# Patient Record
Sex: Female | Born: 1945 | Race: White | Hispanic: No | Smoking: Never smoker
Health system: Southern US, Community
[De-identification: ages and names within clinical notes are randomized; demographics above are authoritative.]

## PROBLEM LIST (undated history)

## (undated) DIAGNOSIS — R112 Nausea with vomiting, unspecified: Secondary | ICD-10-CM

## (undated) DIAGNOSIS — T7840XA Allergy, unspecified, initial encounter: Secondary | ICD-10-CM

## (undated) DIAGNOSIS — I10 Essential (primary) hypertension: Secondary | ICD-10-CM

## (undated) DIAGNOSIS — F329 Major depressive disorder, single episode, unspecified: Secondary | ICD-10-CM

## (undated) DIAGNOSIS — Z923 Personal history of irradiation: Secondary | ICD-10-CM

## (undated) DIAGNOSIS — J45909 Unspecified asthma, uncomplicated: Secondary | ICD-10-CM

## (undated) DIAGNOSIS — Z9889 Other specified postprocedural states: Secondary | ICD-10-CM

## (undated) DIAGNOSIS — IMO0002 Reserved for concepts with insufficient information to code with codable children: Secondary | ICD-10-CM

## (undated) DIAGNOSIS — F32A Depression, unspecified: Secondary | ICD-10-CM

## (undated) DIAGNOSIS — M1990 Unspecified osteoarthritis, unspecified site: Secondary | ICD-10-CM

## (undated) DIAGNOSIS — I739 Peripheral vascular disease, unspecified: Secondary | ICD-10-CM

## (undated) DIAGNOSIS — R011 Cardiac murmur, unspecified: Secondary | ICD-10-CM

## (undated) DIAGNOSIS — I4891 Unspecified atrial fibrillation: Secondary | ICD-10-CM

## (undated) DIAGNOSIS — IMO0001 Reserved for inherently not codable concepts without codable children: Secondary | ICD-10-CM

## (undated) DIAGNOSIS — E785 Hyperlipidemia, unspecified: Secondary | ICD-10-CM

## (undated) DIAGNOSIS — C50919 Malignant neoplasm of unspecified site of unspecified female breast: Secondary | ICD-10-CM

## (undated) DIAGNOSIS — I639 Cerebral infarction, unspecified: Secondary | ICD-10-CM

## (undated) DIAGNOSIS — D649 Anemia, unspecified: Secondary | ICD-10-CM

## (undated) DIAGNOSIS — E119 Type 2 diabetes mellitus without complications: Secondary | ICD-10-CM

## (undated) DIAGNOSIS — F419 Anxiety disorder, unspecified: Secondary | ICD-10-CM

## (undated) DIAGNOSIS — H269 Unspecified cataract: Secondary | ICD-10-CM

## (undated) HISTORY — DX: Peripheral vascular disease, unspecified: I73.9

## (undated) HISTORY — DX: Cardiac murmur, unspecified: R01.1

## (undated) HISTORY — DX: Cerebral infarction, unspecified: I63.9

## (undated) HISTORY — PX: EYE SURGERY: SHX253

## (undated) HISTORY — DX: Anemia, unspecified: D64.9

## (undated) HISTORY — DX: Unspecified cataract: H26.9

## (undated) HISTORY — DX: Essential (primary) hypertension: I10

## (undated) HISTORY — DX: Unspecified osteoarthritis, unspecified site: M19.90

## (undated) HISTORY — PX: BREAST BIOPSY: SHX20

## (undated) HISTORY — PX: BREAST LUMPECTOMY: SHX2

## (undated) HISTORY — DX: Allergy, unspecified, initial encounter: T78.40XA

## (undated) HISTORY — DX: Type 2 diabetes mellitus without complications: E11.9

## (undated) HISTORY — PX: CHOLECYSTECTOMY: SHX55

## (undated) HISTORY — DX: Hyperlipidemia, unspecified: E78.5

---

## 1985-10-14 HISTORY — PX: APPENDECTOMY: SHX54

## 1985-10-14 HISTORY — PX: EXPLORATORY LAPAROTOMY: SUR591

## 2000-01-10 ENCOUNTER — Other Ambulatory Visit: Admission: RE | Admit: 2000-01-10 | Discharge: 2000-01-10 | Payer: Self-pay | Admitting: Obstetrics and Gynecology

## 2001-03-03 ENCOUNTER — Other Ambulatory Visit: Admission: RE | Admit: 2001-03-03 | Discharge: 2001-03-03 | Payer: Self-pay | Admitting: Obstetrics and Gynecology

## 2001-04-21 ENCOUNTER — Inpatient Hospital Stay (HOSPITAL_COMMUNITY): Admission: EM | Admit: 2001-04-21 | Discharge: 2001-04-24 | Payer: Self-pay | Admitting: Psychiatry

## 2001-04-27 ENCOUNTER — Other Ambulatory Visit (HOSPITAL_COMMUNITY): Admission: RE | Admit: 2001-04-27 | Discharge: 2001-05-01 | Payer: Self-pay | Admitting: Psychiatry

## 2001-06-17 ENCOUNTER — Encounter: Payer: Self-pay | Admitting: Family Medicine

## 2001-06-17 ENCOUNTER — Encounter: Admission: RE | Admit: 2001-06-17 | Discharge: 2001-06-17 | Payer: Self-pay | Admitting: Family Medicine

## 2001-06-19 ENCOUNTER — Inpatient Hospital Stay (HOSPITAL_COMMUNITY): Admission: AD | Admit: 2001-06-19 | Discharge: 2001-06-19 | Payer: Self-pay | Admitting: Obstetrics and Gynecology

## 2009-10-14 HISTORY — PX: SHOULDER SURGERY: SHX246

## 2009-10-16 ENCOUNTER — Encounter: Admission: RE | Admit: 2009-10-16 | Discharge: 2009-10-16 | Payer: Self-pay | Admitting: Unknown Physician Specialty

## 2011-03-01 NOTE — Discharge Summary (Signed)
[facility]  Patient:    Meagan Wallace, Meagan Wallace Visit Number: [id number] MRN: [id number]          Service Type: PSY Location: [facility] Attending Physician:  Benjaman Pott Dictated by:   Reymundo Poll Dub Mikes, M.D. Adm. Date:  04/27/2001 Disc. Date: 05/01/2001                             Discharge Summary  CHIEF COMPLAINT AND PRESENT ILLNESS:  This was the first admission to [facility] for this 65 year old female, who was voluntarily admitted due to delusional behaviors.  Reports increased sadness, depression with vague thoughts of suicide.  No specific plan.  She stated "I just thought I would not be here tomorrow."  The patient was brought into her physicians office by her husband yesterday after coming home and looking for a niece, who lives in [place].  Reports she has been hearing gunshots, a 21-gun salute, from the noise of peoples shoes at work.  She also stated that she believes people at work are against, that they are talking about her.  When asked about urges to hurt other people, patient stated that she just wants to punch a man. She is referring to a man at church, "I just want to punch this man in the nose because the negative comments he makes about his grandsons music."  The patient reports inability to commence to sleep for the last 1-2 weeks because, when she tries to rest, she states "my mind is on fire."  The patient also states that, at the doctors office, she saw a lot of paperbacks that were also on fire.  The patient is unable to cite any particular stressor at this time.  She is able to promise safety.  She has no specific plan or intent to hurt herself.  She also cites some additional stressor of losing her church community.  She would not attend this church anymore because her husband does not want to go there.  She feels that she should be with him and support him.  PAST PSYCHIATRIC HISTORY:  Denies any previous  inpatient treatment.  Some counseling through [facility] here in [place].  Does have a current psychiatrist.  Was prescribed antidepressant by his family physician.  Does describe a previous history of depression with poor sleep several years ago.  ALCOHOL/DRUG HISTORY:  Denies the use or abuse of any drugs or alcohol.  PAST MEDICAL HISTORY:  Hypertension, asthma, mild anemia, mitral valve prolapse tricuspid regurgitation, history of rheumatic fever, history of colon polyps.  MEDICATIONS:  Prinzide 25 mg daily, Prempro 0.625 mg, 5 every day, atenolol 50 mg every day, Maxair 2 puffs as needed and Zoloft 50 mg per day.  She reports that she has stopped all of these medications and has taken none over the past 1-2 weeks.  PHYSICAL EXAMINATION:  Performed at the emergency room and was within normal limits.  MENTAL STATUS EXAMINATION:  Casually dressed, overweight, middle-aged female, who hygiene is good.  Dressed appropriately.  Her affect is blunted.  Poor eye contact, actually keeping her eyes closed through most of the interview. Otherwise, cooperative and pleasant.  Guarded most of the time.  Speech is spontaneous, normal is pace and tone, generally relevant.  Mood is guarded, sad, tearful and depressed.  Thought process is positive for paranoid ideation.  Positive for both suicidal ideation and some homicidal ideation. No specific intent or plan.  Having  both auditory and visual hallucinations. Oriented to person, place and time.  ADMITTING DIAGNOSES: Axis I:    Major depression, recurrent, severe with psychotic features. Axis II:   No diagnosis. Axis III:  1. Mild anemia.            2. Hypertension. Axis IV:   Moderate. Axis V:    Global Assessment of Functioning upon admission 30; highest Global            Assessment of Functioning in the last year 60.  LABORATORY DATA:  Within normal limits except for hemoglobin was 11.1.  HOSPITAL COURSE:  She was admitted and  started intensive individual and group psychotherapy.  She was kept on the Prinzide, Prempro, atenolol and Maxair. She was started on Zoloft 50 mg per day.  It was increased to 75 mg per day. She was started on Risperdal 0.25 mg twice a day.  She started responding to the medications.  She was initially very vague.  Reality testing was affected. Some ideas of reference.  Recognizing people on the unit that she did not know from before.  Cannot accept the fact that this might be out of touch with reality.  On July 12, she felt she was much better, sleeping better, denies any further confusion, denies suicidal ideation.  Met with her and her husband.  She feels he is back to baseline with her.  Does understand she needs to stay on the medication.  He feels comfortable taking her home.  She is willing to come back to IOP for further stabilization.  There is no suicidal ideation, no homicidal ideation, no active psychosis, for what discharge was granted.  DISCHARGE DIAGNOSES: Axis I:    Major depression, recurrent, severe with psychotic features. Axis II:   No diagnosis. Axis III:  1. Hypertension.            2. Mild anemia. Axis IV:   Moderate. Axis V:    Global Assessment of Functioning upon discharge 50-55.  DISCHARGE MEDICATIONS: 1. Risperdal 0.25 mg twice a day. 2. Zoloft 100 mg at bedtime. 3. Premarin. 4. Provera. 5. Tenormin. 6. Prinzide.  FOLLOW-UP:  IOP program. Dictated by:   Reymundo Poll. Dub Mikes, M.D. Attending Physician:  Carolanne Grumbling D DD:  06/03/01 TD:  06/08/01 Job: [id number]

## 2011-03-01 NOTE — H&P (Signed)
[facility]  Patient:    Meagan Wallace, Meagan Wallace                    MRN: [id number] Adm. Date:  [id number] Attending:  Rachael Fee Dictator:   Young Berry Lorin Picket, N.P.                   Psychiatric Admission Assessment  DATE OF ADMISSION:  April 21, 2001.  IDENTIFYING INFORMATION:  This is a 65 year old married Caucasian female who is a voluntary admission for delusional behavior.  HISTORY OF THE PRESENT ILLNESS:  Patient reports increased sadness and depression with vague thoughts of suicide but no specific plan.  She states "I just thought I wouldnt be here tomorrow."  Patient was brought in to her physicians office by her husband yesterday after coming home and looking for a niece who lives in [place].  Patient also reports she has been hearing gunshots, a 21-gun salute, from the noise of peoples shoes at work, and she also states that she believes people at work are against her, that they are talking about her.  When asked about urges to hurt other people, patient states that she just wants to punch a man -- she is referring to a man at church -- "I just want to punch this man in the nose because of negative comments he made about her grandsons music.  Patient reports inability to sleep for the last 1-2 weeks, because when she tries to rest she states "my mind is on fire."  Patient also states that at the doctors office she saw a lot of paper bags that were also on fire.  Patient is unable to cite any specific stressors at this time.  She is able to promise safety on the unit. She has no specific plan or intent at this time to commit suicide.  She does cite some additional stressors of losing her church community.  She will not attend at this church anymore, because her husband does not want to go there and she feels that she should be with him and support him.  PAST PSYCHIATRIC HISTORY:  Patient denies any previous inpatient treatment. She has had  some counseling recently through [facility] here in [place].  She does not have a current psychiatrist.  She has been prescribed antidepressant by her primary care physician.  Patient does describe a previous history of depression with poor sleep several years ago at the time she was changing jobs.  SOCIAL HISTORY:  Patient is currently married.  She lives with her husband in what she states is a supportive marriage, but then later states "some days are better than others."  She works full time at [employer] in [place].  She has no biological children.  She has 2 stepchildren and 5 grandchildren, and she feels sad about not being able to see her grandchildren as often as she would like.  FAMILY HISTORY:  Patient denies any family history of substance abuse or mental illness.  ALCOHOL AND DRUG HISTORY:  Patient denies any use of alcohol or illegal drugs or tobacco.  PAST MEDICAL HISTORY:  Patient is followed by Dr. Nathanial Rancher at [facility].  Medical problems include hypertension, asthma, mild anemia, mitral valve prolapse with tricuspid regurgitation as noted on echo previously, history of rheumatic fever as a child, and a history of colon polyps.  This medical history was provided by fax from Dr. Kathlen Mody office.  Patient has had a D&C in the  past for a polypectomy and appendectomy.  Medications are Prinzide 20/25 1 q.d., Prempro .625/5 1 q.d., atenolol 50 mg q.d., Maxair 2 puffs p.r.n. for shortness of breath, and Zoloft 50 mg q.d.  It should be noted here that the patient reports that she has stopped all of these medications and has taken none at various times over the past 1-2 weeks.  DRUG ALLERGIES:  No known drug allergies.  POSITIVE PHYSICAL FINDINGS:  Patients PE is pending.  Her CMET is within normal limits and additional labs are pending, including a urine and a urine drug screen.  MENTAL STATUS EXAMINATION:  This is a casually dressed, overweight  middle-aged female whose hygiene is good.  She is dressed appropriately.  Her affect is blunted.  She has poor eye contact, actually keeping her eyes closed through most of the interview, but she is otherwise cooperative and pleasant, though guarded most of the time.  Speech is spontaneous and is normal in pace and tone and is generally relevant.  Mood is guarded, sad, tearful and depressed. Thought process is positive for paranoid ideation, positive for both suicidal ideation and some homicidal ideation, with no specific intent or plan.  She is having both auditory hallucinations and visual hallucinations.  Cognitively, she is oriented x 3 and is intact.  ADMISSION DIAGNOSES: Axis I:    Major depression, recurrent, severe, with psychotic features. Axis II:   Deferred. Axis III:  Mild anemia and hypertension. Axis IV:   Moderate problems related to the primary support group, including            some underlying stress in her marriage that appears evident. Axis V:    Current 30, past year 72.  INITIAL PLAN OF CARE:  We will admit the patient to stabilize her mood.  Q.15 minute checks are in place for safety.  She is able to promise safety on the unit.  We will ask the case manager to do a family session with the patient and her husband to discuss concerns when the patient is able to tolerate a session.  We have started her on Risperdal 0.125 mg p.o. b.i.d. and we will restart her routine medications, including her Zoloft 50 mg daily.  We will also start Seroquel 25 mg p.o. q.h.s. and our goal is to alleviate her suicidal ideation and her homicidal ideation and to eliminate her psychotic symptoms so that she can go home and be safe, functioning in her activities of daily living.  TENTATIVE LENGTH OF STAY:  4-6 days. DD:  04/22/01 TD:  04/22/01 Job: [id number]

## 2011-11-26 DIAGNOSIS — E119 Type 2 diabetes mellitus without complications: Secondary | ICD-10-CM | POA: Diagnosis not present

## 2011-11-26 DIAGNOSIS — I10 Essential (primary) hypertension: Secondary | ICD-10-CM | POA: Diagnosis not present

## 2011-11-26 DIAGNOSIS — J45909 Unspecified asthma, uncomplicated: Secondary | ICD-10-CM | POA: Diagnosis not present

## 2012-05-14 DIAGNOSIS — H35039 Hypertensive retinopathy, unspecified eye: Secondary | ICD-10-CM | POA: Diagnosis not present

## 2012-05-14 DIAGNOSIS — H40059 Ocular hypertension, unspecified eye: Secondary | ICD-10-CM | POA: Diagnosis not present

## 2012-05-26 DIAGNOSIS — R7301 Impaired fasting glucose: Secondary | ICD-10-CM | POA: Diagnosis not present

## 2012-05-26 DIAGNOSIS — I10 Essential (primary) hypertension: Secondary | ICD-10-CM | POA: Diagnosis not present

## 2012-08-01 DIAGNOSIS — Z23 Encounter for immunization: Secondary | ICD-10-CM | POA: Diagnosis not present

## 2012-09-03 DIAGNOSIS — I2510 Atherosclerotic heart disease of native coronary artery without angina pectoris: Secondary | ICD-10-CM | POA: Diagnosis not present

## 2012-09-03 DIAGNOSIS — I10 Essential (primary) hypertension: Secondary | ICD-10-CM | POA: Diagnosis not present

## 2012-09-03 DIAGNOSIS — E7800 Pure hypercholesterolemia, unspecified: Secondary | ICD-10-CM | POA: Diagnosis not present

## 2012-09-03 DIAGNOSIS — E119 Type 2 diabetes mellitus without complications: Secondary | ICD-10-CM | POA: Diagnosis not present

## 2012-10-13 DIAGNOSIS — I2510 Atherosclerotic heart disease of native coronary artery without angina pectoris: Secondary | ICD-10-CM | POA: Diagnosis not present

## 2012-10-13 DIAGNOSIS — E7800 Pure hypercholesterolemia, unspecified: Secondary | ICD-10-CM | POA: Diagnosis not present

## 2012-10-13 DIAGNOSIS — E119 Type 2 diabetes mellitus without complications: Secondary | ICD-10-CM | POA: Diagnosis not present

## 2012-10-13 DIAGNOSIS — J45901 Unspecified asthma with (acute) exacerbation: Secondary | ICD-10-CM | POA: Diagnosis not present

## 2012-10-13 DIAGNOSIS — J209 Acute bronchitis, unspecified: Secondary | ICD-10-CM | POA: Diagnosis not present

## 2012-10-13 DIAGNOSIS — I10 Essential (primary) hypertension: Secondary | ICD-10-CM | POA: Diagnosis not present

## 2012-10-23 DIAGNOSIS — E119 Type 2 diabetes mellitus without complications: Secondary | ICD-10-CM | POA: Diagnosis not present

## 2012-10-23 DIAGNOSIS — J45909 Unspecified asthma, uncomplicated: Secondary | ICD-10-CM | POA: Diagnosis not present

## 2012-10-23 DIAGNOSIS — I10 Essential (primary) hypertension: Secondary | ICD-10-CM | POA: Diagnosis not present

## 2012-11-06 DIAGNOSIS — I10 Essential (primary) hypertension: Secondary | ICD-10-CM | POA: Diagnosis not present

## 2012-11-06 DIAGNOSIS — J45909 Unspecified asthma, uncomplicated: Secondary | ICD-10-CM | POA: Diagnosis not present

## 2012-11-13 DIAGNOSIS — R062 Wheezing: Secondary | ICD-10-CM | POA: Diagnosis not present

## 2012-11-20 DIAGNOSIS — J45909 Unspecified asthma, uncomplicated: Secondary | ICD-10-CM | POA: Diagnosis not present

## 2012-11-20 DIAGNOSIS — I10 Essential (primary) hypertension: Secondary | ICD-10-CM | POA: Diagnosis not present

## 2013-02-19 DIAGNOSIS — I10 Essential (primary) hypertension: Secondary | ICD-10-CM | POA: Diagnosis not present

## 2013-02-19 DIAGNOSIS — E785 Hyperlipidemia, unspecified: Secondary | ICD-10-CM | POA: Diagnosis not present

## 2013-02-19 DIAGNOSIS — E119 Type 2 diabetes mellitus without complications: Secondary | ICD-10-CM | POA: Diagnosis not present

## 2013-02-19 DIAGNOSIS — F329 Major depressive disorder, single episode, unspecified: Secondary | ICD-10-CM | POA: Diagnosis not present

## 2013-03-09 DIAGNOSIS — F4321 Adjustment disorder with depressed mood: Secondary | ICD-10-CM | POA: Diagnosis not present

## 2013-03-23 DIAGNOSIS — F4321 Adjustment disorder with depressed mood: Secondary | ICD-10-CM | POA: Diagnosis not present

## 2013-04-02 DIAGNOSIS — F4321 Adjustment disorder with depressed mood: Secondary | ICD-10-CM | POA: Diagnosis not present

## 2013-04-14 DIAGNOSIS — F4321 Adjustment disorder with depressed mood: Secondary | ICD-10-CM | POA: Diagnosis not present

## 2013-04-28 DIAGNOSIS — F4321 Adjustment disorder with depressed mood: Secondary | ICD-10-CM | POA: Diagnosis not present

## 2013-05-12 DIAGNOSIS — F4321 Adjustment disorder with depressed mood: Secondary | ICD-10-CM | POA: Diagnosis not present

## 2013-06-08 DIAGNOSIS — F4321 Adjustment disorder with depressed mood: Secondary | ICD-10-CM | POA: Diagnosis not present

## 2013-07-01 DIAGNOSIS — F4321 Adjustment disorder with depressed mood: Secondary | ICD-10-CM | POA: Diagnosis not present

## 2013-08-04 DIAGNOSIS — Z23 Encounter for immunization: Secondary | ICD-10-CM | POA: Diagnosis not present

## 2013-08-17 DIAGNOSIS — J45909 Unspecified asthma, uncomplicated: Secondary | ICD-10-CM | POA: Diagnosis not present

## 2013-08-17 DIAGNOSIS — E119 Type 2 diabetes mellitus without complications: Secondary | ICD-10-CM | POA: Diagnosis not present

## 2013-08-17 DIAGNOSIS — I10 Essential (primary) hypertension: Secondary | ICD-10-CM | POA: Diagnosis not present

## 2013-08-17 DIAGNOSIS — F329 Major depressive disorder, single episode, unspecified: Secondary | ICD-10-CM | POA: Diagnosis not present

## 2013-09-02 DIAGNOSIS — Z0000 Encounter for general adult medical examination without abnormal findings: Secondary | ICD-10-CM | POA: Diagnosis not present

## 2013-09-15 DIAGNOSIS — Z1211 Encounter for screening for malignant neoplasm of colon: Secondary | ICD-10-CM | POA: Diagnosis not present

## 2013-09-27 DIAGNOSIS — J309 Allergic rhinitis, unspecified: Secondary | ICD-10-CM | POA: Diagnosis not present

## 2013-09-27 DIAGNOSIS — H6690 Otitis media, unspecified, unspecified ear: Secondary | ICD-10-CM | POA: Diagnosis not present

## 2013-09-29 DIAGNOSIS — Z13820 Encounter for screening for osteoporosis: Secondary | ICD-10-CM | POA: Diagnosis not present

## 2013-09-29 DIAGNOSIS — Z1231 Encounter for screening mammogram for malignant neoplasm of breast: Secondary | ICD-10-CM | POA: Diagnosis not present

## 2013-09-29 DIAGNOSIS — R928 Other abnormal and inconclusive findings on diagnostic imaging of breast: Secondary | ICD-10-CM | POA: Diagnosis not present

## 2013-11-01 DIAGNOSIS — R928 Other abnormal and inconclusive findings on diagnostic imaging of breast: Secondary | ICD-10-CM | POA: Diagnosis not present

## 2013-11-01 DIAGNOSIS — N63 Unspecified lump in unspecified breast: Secondary | ICD-10-CM | POA: Diagnosis not present

## 2013-11-11 DIAGNOSIS — N63 Unspecified lump in unspecified breast: Secondary | ICD-10-CM | POA: Diagnosis not present

## 2013-11-11 DIAGNOSIS — D0590 Unspecified type of carcinoma in situ of unspecified breast: Secondary | ICD-10-CM | POA: Diagnosis not present

## 2013-11-12 DIAGNOSIS — D0590 Unspecified type of carcinoma in situ of unspecified breast: Secondary | ICD-10-CM | POA: Diagnosis not present

## 2013-11-23 ENCOUNTER — Telehealth: Payer: Self-pay | Admitting: *Deleted

## 2013-11-23 DIAGNOSIS — C50211 Malignant neoplasm of upper-inner quadrant of right female breast: Secondary | ICD-10-CM

## 2013-11-23 DIAGNOSIS — D0511 Intraductal carcinoma in situ of right breast: Secondary | ICD-10-CM | POA: Insufficient documentation

## 2013-11-23 NOTE — Telephone Encounter (Signed)
Confirmed [facility] for 2/18/15at 0800 .  Instructions and contact information given.

## 2013-12-01 ENCOUNTER — Encounter (INDEPENDENT_AMBULATORY_CARE_PROVIDER_SITE_OTHER): Payer: Self-pay | Admitting: Surgery

## 2013-12-01 ENCOUNTER — Ambulatory Visit
Admission: RE | Admit: 2013-12-01 | Discharge: 2013-12-01 | Disposition: A | Discharge status: Home / Self Care | Payer: Medicare Other | Source: Ambulatory Visit | Attending: Radiation Oncology | Admitting: Radiation Oncology

## 2013-12-01 ENCOUNTER — Ambulatory Visit: Payer: Medicare Other | Admitting: Physical Therapy

## 2013-12-01 ENCOUNTER — Encounter: Payer: Self-pay | Admitting: Oncology

## 2013-12-01 ENCOUNTER — Telehealth: Payer: Self-pay | Admitting: Oncology

## 2013-12-01 ENCOUNTER — Ambulatory Visit (HOSPITAL_BASED_OUTPATIENT_CLINIC_OR_DEPARTMENT_OTHER): Payer: Medicare Other | Admitting: Surgery

## 2013-12-01 ENCOUNTER — Other Ambulatory Visit (HOSPITAL_BASED_OUTPATIENT_CLINIC_OR_DEPARTMENT_OTHER): Payer: Medicare Other

## 2013-12-01 ENCOUNTER — Encounter: Payer: Self-pay | Admitting: *Deleted

## 2013-12-01 ENCOUNTER — Other Ambulatory Visit: Payer: Self-pay | Admitting: *Deleted

## 2013-12-01 ENCOUNTER — Ambulatory Visit (HOSPITAL_BASED_OUTPATIENT_CLINIC_OR_DEPARTMENT_OTHER): Payer: Medicare Other | Admitting: Oncology

## 2013-12-01 ENCOUNTER — Ambulatory Visit: Payer: Medicare Other

## 2013-12-01 VITALS — BP 158/95 | HR 96 | Temp 98.4°F | Resp 20 | Ht 64.0 in | Wt 257.6 lb

## 2013-12-01 DIAGNOSIS — Z171 Estrogen receptor negative status [ER-]: Secondary | ICD-10-CM | POA: Diagnosis not present

## 2013-12-01 DIAGNOSIS — D0590 Unspecified type of carcinoma in situ of unspecified breast: Secondary | ICD-10-CM

## 2013-12-01 DIAGNOSIS — C50211 Malignant neoplasm of upper-inner quadrant of right female breast: Secondary | ICD-10-CM

## 2013-12-01 DIAGNOSIS — D0510 Intraductal carcinoma in situ of unspecified breast: Secondary | ICD-10-CM | POA: Insufficient documentation

## 2013-12-01 DIAGNOSIS — C50219 Malignant neoplasm of upper-inner quadrant of unspecified female breast: Secondary | ICD-10-CM | POA: Diagnosis not present

## 2013-12-01 DIAGNOSIS — C50919 Malignant neoplasm of unspecified site of unspecified female breast: Secondary | ICD-10-CM

## 2013-12-01 LAB — COMPREHENSIVE METABOLIC PANEL (CC13)
ALT: 17 U/L (ref 0–55)
AST: 13 U/L (ref 5–34)
Albumin: 3.7 g/dL (ref 3.5–5.0)
Alkaline Phosphatase: 97 U/L (ref 40–150)
Anion Gap: 11 mEq/L (ref 3–11)
BUN: 11.8 mg/dL (ref 7.0–26.0)
CO2: 27 mEq/L (ref 22–29)
Calcium: 10.5 mg/dL — ABNORMAL HIGH (ref 8.4–10.4)
Chloride: 103 mEq/L (ref 98–109)
Creatinine: 0.7 mg/dL (ref 0.6–1.1)
Glucose: 103 mg/dl (ref 70–140)
Potassium: 4.2 mEq/L (ref 3.5–5.1)
Sodium: 141 mEq/L (ref 136–145)
Total Bilirubin: 0.56 mg/dL (ref 0.20–1.20)
Total Protein: 7.4 g/dL (ref 6.4–8.3)

## 2013-12-01 LAB — CBC WITH DIFFERENTIAL/PLATELET
BASO%: 1 % (ref 0.0–2.0)
Basophils Absolute: 0.1 10*3/uL (ref 0.0–0.1)
EOS%: 0.5 % (ref 0.0–7.0)
Eosinophils Absolute: 0.1 10*3/uL (ref 0.0–0.5)
HCT: 42.1 % (ref 34.8–46.6)
HGB: 13.8 g/dL (ref 11.6–15.9)
LYMPH%: 15.5 % (ref 14.0–49.7)
MCH: 26.4 pg (ref 25.1–34.0)
MCHC: 32.7 g/dL (ref 31.5–36.0)
MCV: 80.7 fL (ref 79.5–101.0)
MONO#: 0.9 10*3/uL (ref 0.1–0.9)
MONO%: 7.2 % (ref 0.0–14.0)
NEUT#: 9.2 10*3/uL — ABNORMAL HIGH (ref 1.5–6.5)
NEUT%: 75.8 % (ref 38.4–76.8)
Platelets: 236 10*3/uL (ref 145–400)
RBC: 5.21 10*6/uL (ref 3.70–5.45)
RDW: 14.9 % — ABNORMAL HIGH (ref 11.2–14.5)
WBC: 12.1 10*3/uL — ABNORMAL HIGH (ref 3.9–10.3)
lymph#: 1.9 10*3/uL (ref 0.9–3.3)

## 2013-12-01 NOTE — Progress Notes (Unsigned)
Checked in new patient with no financial issues. She has [facility] packet and appt card. She wants messages left on vmail only.  *************  Do not leave with husband *************  He has demetia.

## 2013-12-01 NOTE — Telephone Encounter (Signed)
, °

## 2013-12-01 NOTE — Progress Notes (Signed)
Radiation Oncology         [phone]) [phone] ________________________________  Initial outpatient Consultation - Date: 12/01/2013   Name: Meagan Wallace MRN: [id number]   DOB: 04-21-1946  REFERRING PHYSICIAN: Turner Daniels., MD  DIAGNOSIS: No diagnosis found.  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   HISTORY OF PRESENT ILLNESS::Meagan Wallace is a 68 y.o. female  presented for screening mammogram and was found have a mass and calcifications in the right breast. The mass measured 1.4 cm. The calcifications are not biopsied. 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 No invasive component was noted. ER/PR negative. An MRI has not been ordered. She has no symptoms and no previous history of breast cancer. She is GX P0. She has had pain in her right breast after her biopsy. She does have a history of a right shoulder surgery. She was concerned about whether she would have any loss of motion in her right arm.  PREVIOUS RADIATION THERAPY: No  PAST MEDICAL HISTORY:  has a past medical history of Diabetes mellitus without complication; Hypertension; Cataract; and Stroke.    PAST SURGICAL HISTORY: Past Surgical History  Procedure Laterality Date  . Shoulder surgery      FAMILY HISTORY:  Family History  Problem Relation Age of Onset  . Breast cancer Paternal Aunt     SOCIAL HISTORY:  History  Substance Use Topics  . Smoking status: Never Smoker   . Smokeless tobacco: Not on file  . Alcohol Use: No    ALLERGIES: Review of patient's allergies indicates no known allergies.  MEDICATIONS:  Current Outpatient Prescriptions  Medication Sig Dispense Refill  . amLODipine (NORVASC) 10 MG tablet Take 10 mg by mouth daily.        Marland Kitchen aspirin 325 MG tablet Take 325 mg by mouth daily.      . hydrochlorothiazide (HYDRODIURIL) 25 MG tablet Take 25 mg by mouth daily.      . metFORMIN (GLUCOPHAGE) 500 MG tablet Take 500 mg by mouth 2 (two) times daily with a meal.      . ramipril (ALTACE) 10 MG capsule Take 10 mg by mouth 2 (two) times daily.      . sertraline (ZOLOFT) 100 MG tablet Take 100 mg by mouth daily.       No current facility-administered medications for this encounter.    REVIEW OF SYSTEMS:  A 15 point review of systems is documented in the electronic medical record. This was obtained by the nursing staff. However, I reviewed this with the patient to discuss relevant findings and make appropriate changes.  Pertinent items are noted in HPI.  PHYSICAL EXAM: There were no vitals filed for this visit.. . Pleasant female in no distress sitting comfortably on examining room table. She has large pendulous breasts bilaterally. No palpable abnormalities of the right breast. She has no biopsy change on the skin. No palpable abnormalities of the left breast. She has 5 out of 5 strength bilaterally. No palpable axillary supraclavicular or cervical adenopathy. She is alert minus x3.  LABORATORY DATA:  Lab Results  Component Value Date   WBC 12.1* 12/01/2013   HGB 13.8 12/01/2013   HCT 42.1 12/01/2013   MCV 80.7 12/01/2013   PLT  236 12/01/2013   Lab Results  Component Value Date   NA 141 12/01/2013   K 4.2 12/01/2013   CO2 27 12/01/2013   Lab Results  Component Value Date   ALT 17 12/01/2013   AST 13 12/01/2013   ALKPHOS 97 12/01/2013   BILITOT 0.56 12/01/2013     RADIOGRAPHY: No results found.    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 We discussed the equivalency in terms of survival between mastectomy and lumpectomy. There is no survival benefit to mastectomy. She has early stage 0  disease and large breasts. I do believe she would benefit from a biopsy of these calcifications for surgical planning. Unit she did have to have this area removed she could likely tolerate this with the minimal cosmetic defect. She has some asymmetry which could be corrected a later date. She has no invasive disease I do not believe she would benefit from a sentinel lymph node. We discussed the role of radiation and decreasing local failures in patients who undergo lumpectomy. We discussed the process of simulation the placement tattoos. We discussed 6 weeks of treatment as an outpatient. We discussed treatment here or -- thorough. They're midway between the hospital summer for further receive her treatment here. We discussed skin redness and fatigue as possible side effects. We discussed the low likelihood of symptomatic lung or rib damage.  She will meet with physical therapy, medical oncology, surgery and a member of our patient family support team. The navigator's will followup with her as well regarding scheduling her MRI and her biopsy. I spent 40 minutes  face to face with the patient and more than 50% of that time was spent in counseling and/or coordination of care.   ------------------------------------------------  Thea Silversmith, MD

## 2013-12-01 NOTE — Progress Notes (Signed)
[place] Psychosocial Distress Screening Clinical Social Work  Patient completed distress screening protocol, and scored a 1 on the Psychosocial Distress Thermometer which indicates mild distress. Clinical Education officer, museum met with pt and pt's family in [facility] to assess for distress and other psychosocial needs.  Pt stated she was doing well and felt comfortable with her treatment plan.  CSW informed pt of the support team and support services at [facility].  Pt was appreciative of support, but did not express any needs at this time.  CSE encouraged pt to call with any additional questions or concerns.     Johnnye Lana, MSW, [place] Worker [facility] [phone]

## 2013-12-01 NOTE — Progress Notes (Signed)
Patient ID: Meagan Wallace, female   DOB: [birth year], 69 y.o.   MRN: [id number]  No chief complaint on file.   HPI Meagan Wallace is a 67 y.o. female.  Pt presents with DCIS to [facility] clinic today. No other complaints today. No history of breast issues.  No masses.  Has an area of calcifications not yet bx.  HPI  Past Medical History  Diagnosis Date  . Diabetes mellitus without complication   . Hypertension   . Cataract   . Stroke     Past Surgical History  Procedure Laterality Date  . Shoulder surgery      Family History  Problem Relation Age of Onset  . Breast cancer Paternal Aunt     Social History History  Substance Use Topics  . Smoking status: Never Smoker   . Smokeless tobacco: Not on file  . Alcohol Use: No    No Known Allergies  Current Outpatient Prescriptions  Medication Sig Dispense Refill  . amLODipine (NORVASC) 10 MG tablet Take 10 mg by mouth daily.      Marland Kitchen aspirin 325 MG tablet Take 325 mg by mouth daily.      . hydrochlorothiazide (HYDRODIURIL) 25 MG tablet Take 25 mg by mouth daily.      . metFORMIN (GLUCOPHAGE) 500 MG tablet Take 500 mg by mouth 2 (two) times daily with a meal.      . ramipril (ALTACE) 10 MG capsule Take 10 mg by mouth 2 (two) times daily.      . sertraline (ZOLOFT) 100 MG tablet Take 100 mg by mouth daily.       No current facility-administered medications for this visit.    Review of Systems Review of Systems  Constitutional: Negative for fever, chills and unexpected weight change.  HENT: Negative for congestion, hearing loss, sore throat, trouble swallowing and voice change.   Eyes: Negative for visual disturbance.  Respiratory: Negative for cough and wheezing.   Cardiovascular: Negative for chest pain, palpitations and leg swelling.  Gastrointestinal: Negative for nausea, vomiting, abdominal pain, diarrhea, constipation, blood in stool, abdominal distention and anal bleeding.  Genitourinary: Negative for hematuria,  vaginal bleeding and difficulty urinating.  Musculoskeletal: Negative for arthralgias.  Skin: Negative for rash and wound.  Neurological: Negative for seizures, syncope and headaches.  Hematological: Negative for adenopathy. Does not bruise/bleed easily.  Psychiatric/Behavioral: Negative for confusion.    There were no vitals taken for this visit.  Physical Exam Physical Exam  Constitutional: She is oriented to person, place, and time. She appears well-developed.  HENT:  Head: Normocephalic.  Mouth/Throat: No oropharyngeal exudate.  Eyes: EOM are normal. Pupils are equal, round, and reactive to light.  Neck: Normal range of motion. Neck supple.  Cardiovascular: Normal rate and regular rhythm.   Pulmonary/Chest: Effort normal and breath sounds normal. Right breast exhibits no inverted nipple, no nipple discharge, no skin change and no tenderness. Left breast exhibits no inverted nipple, no mass, no nipple discharge, no skin change and no tenderness. Breasts are symmetrical.  Musculoskeletal: Normal range of motion.  Neurological: She is alert and oriented to person, place, and time.  Skin: Skin is warm and dry.  Psychiatric: She has a normal mood and affect. Her behavior is normal. Judgment and thought content normal.    Data Reviewed 1 CM AREA OF DISTORTION AND SECOND AREA OF CALCIFICATIONS NOT BX YET. PATH DCIS 1 CM RIGHT  Assessment    RIGHT DCIS AND SECOND UNKNOWN AREA    Plan  NEEDS MRI AND BIOPSY.  SEE ME NEXT WEEK.        Samantha Ragen A. 12/01/2013, 10:38 AM

## 2013-12-01 NOTE — Progress Notes (Signed)
Meagan Wallace [id number] [birth year] 68 y.o. 12/01/2013 10:35 AM  CC Dr. Erroll Luna Dr. 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  STAGE:   Breast cancer of upper-inner quadrant of right female breast   Primary site: Breast (Right)   Staging method: AJCC 7th Edition   Clinical: Stage 0 (Tis, N0, cM0)   Summary: Stage 0 (Tis, N0, cM0)  REFERRING PHYSICIAN: Dr. Marcello Moores Cornett  HISTORY OF PRESENT ILLNESS:  Meagan Wallace is a 68 y.o. female.  Who underwent a screening mammogram and was found to have a mass in calcifications in the right breast. The mass measured 1.4 cm.  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 There was no invasive disease found. The calcifications have not been biopsied. Her tumor  ER negative  PR negative. Her case was discussed at the multidisciplinary breast conference.her pathology and radiology were reviewed. She now presents at the multidisciplinary breast clinic for discussion of treatment options.   Past Medical History: Past Medical History  Diagnosis Date  . Diabetes mellitus without complication   . Hypertension   . Cataract   . Stroke     Past Surgical History: Past Surgical History  Procedure Laterality Date  . Shoulder surgery      Family History: Family History  Problem Relation Age of Onset  . Breast cancer Paternal Aunt     Social History History  Substance Use Topics  . Smoking status: Never Smoker   . Smokeless tobacco: Not on file  . Alcohol Use: No    Allergies: No Known Allergies  Current Medications: Current Outpatient Prescriptions  Medication Sig Dispense Refill  . amLODipine (NORVASC) 10 MG tablet Take 10 mg by mouth daily.      Marland Kitchen aspirin 325 MG tablet Take 325 mg by mouth daily.      . hydrochlorothiazide (HYDRODIURIL) 25 MG tablet Take 25 mg by mouth daily.      . metFORMIN  (GLUCOPHAGE) 500 MG tablet Take 500 mg by mouth 2 (two) times daily with a meal.      . ramipril (ALTACE) 10 MG capsule Take 10 mg by mouth 2 (two) times daily.      . sertraline (ZOLOFT) 100 MG tablet Take 100 mg by mouth daily.       No current facility-administered medications for this visit.    OB/GYN History: menarche at age 56 1/2, menopause in 87, no HRT, OCP x 10 years, nulliparous.  Fertility Discussion: n/a Prior History of Cancer: no   Health Maintenance:  Colonoscopy yes Bone Density yes Last PAP smear unknown  ECOG PERFORMANCE STATUS: 1 - Symptomatic but completely ambulatory  Genetic Counseling/testing: no  REVIEW OF SYSTEMS:  A comprehensive review of systems was negative.  PHYSICAL EXAMINATION: Blood pressure 158/95, pulse 96, temperature 98.4 F (36.9 C), temperature source Oral, resp. rate 20, height 5\' 4"  (1.626 m), weight 257 lb 9.6 oz (116.847 kg).  General:  well-nourished in no acute distress.  Eyes:  no scleral icterus.  ENT:  There were no oropharyngeal lesions.  Neck was without thyromegaly.  Lymphatics:  Negative cervical, supraclavicular or axillary adenopathy.  Respiratory: lungs were clear bilaterally without wheezing or crackles.  Cardiovascular:  Regular rate and rhythm, S1/S2, without murmur, rub or gallop.  There was no pedal edema.  GI:  abdomen was soft, flat, nontender, nondistended, without organomegaly.  Muscoloskeletal:  no spinal tenderness  of palpation of vertebral spine.  Skin exam was without echymosis, petichae.  Neuro exam was nonfocal.  Patient was able to get on and off exam table without assistance.  Gait was normal.  Patient was alerted and oriented.  Attention was good.   Language was appropriate.  Mood was normal without depression.  Speech was not pressured.  Thought content was not tangential.   Breasts: breasts appear normal, no suspicious masses, no skin or nipple changes or axillary nodes.   STUDIES/RESULTS: No results  found.   LABS:    Chemistry      Component Value Date/Time   NA 141 12/01/2013 0807   K 4.2 12/01/2013 0807   CO2 27 12/01/2013 0807   BUN 11.8 12/01/2013 0807   CREATININE 0.7 12/01/2013 0807      Component Value Date/Time   CALCIUM 10.5* 12/01/2013 0807   ALKPHOS 97 12/01/2013 0807   AST 13 12/01/2013 0807   ALT 17 12/01/2013 0807   BILITOT 0.56 12/01/2013 0807      Lab Results  Component Value Date   WBC 12.1* 12/01/2013   HGB 13.8 12/01/2013   HCT 42.1 12/01/2013   MCV 80.7 12/01/2013   PLT 236 12/01/2013   PATHOLOGY  ASSESSMENT/PLAN    68 year old female with  1. 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 DCIS in the area of complex sclerosing lesion. Another area of calcifications noted in the ipsilateral breast. Not biopsied. It was recommneded that this area be biopsy to determine whether the lesions are malignant or benign. The DCIS was ER/PR -.  2. We spent the better part of today's hour-long appointment discussing the biology of breast cancer in general, and the specifics of the patient's tumor in particular. We discussed the multidisciplinary approach to breast cancer treatment. We went over her pathology. She understands that her cancer is a non invasive disease. The treatment will include surgery, radiation and possibly anti-estrogen therapy to help prevent future ER+ breast cancer in the ipsilateral and contralateral breasts. We also discussed the need for further biopsying the calcifications. If she has another focus of DCIS in the calcifications then we will need to remove those as well.  3. Patient will proceed with her surgery first. I plan on seeing her back after the surgery.   Clinical Trial Eligibility: no Multidisciplinary conference discussion yes     Discussion: Patient is being treated per NCCN breast cancer care guidelines appropriate for stage.0   Thank you so much for allowing me to participate in the care of  Meagan Wallace. I will continue to follow up the patient with you and assist in her care.  All questions were answered. The patient knows to call the clinic with any problems, questions or concerns. We can certainly see the patient much sooner if necessary.  I spent 40 minutes counseling the patient face to face. The total time spent in the appointment was 60 minutes.  Marcy Panning, MD Medical/Oncology [facility] [phone] (beeper) [phone] (Office)  12/01/2013, 10:35 AM

## 2013-12-01 NOTE — Patient Instructions (Signed)
Return to see me next week

## 2013-12-02 ENCOUNTER — Ambulatory Visit
Admission: RE | Admit: 2013-12-02 | Discharge: 2013-12-02 | Disposition: A | Discharge status: Home / Self Care | Payer: Medicare Other | Source: Ambulatory Visit | Attending: Oncology | Admitting: Oncology

## 2013-12-02 DIAGNOSIS — C50211 Malignant neoplasm of upper-inner quadrant of right female breast: Secondary | ICD-10-CM

## 2013-12-02 DIAGNOSIS — D0590 Unspecified type of carcinoma in situ of unspecified breast: Secondary | ICD-10-CM | POA: Diagnosis not present

## 2013-12-02 MED ORDER — GADOBENATE DIMEGLUMINE 529 MG/ML IV SOLN
20.0000 mL | Freq: Once | INTRAVENOUS | Status: AC | PRN
  Administered 2013-12-02: 20 mL via INTRAVENOUS

## 2013-12-03 ENCOUNTER — Other Ambulatory Visit: Payer: Self-pay | Admitting: Oncology

## 2013-12-03 DIAGNOSIS — R928 Other abnormal and inconclusive findings on diagnostic imaging of breast: Secondary | ICD-10-CM

## 2013-12-05 ENCOUNTER — Encounter: Payer: Self-pay | Admitting: Oncology

## 2013-12-07 ENCOUNTER — Telehealth: Payer: Self-pay | Admitting: *Deleted

## 2013-12-07 ENCOUNTER — Encounter: Payer: Self-pay | Admitting: *Deleted

## 2013-12-07 NOTE — Telephone Encounter (Signed)
Spoke to pt concerning Jenison from 12/01/13.  Pt denies questions or concerns regarding dx or treatment care plan.  Confirmed future appts.  Encourage pt to call with needs. Received verbal understanding.  Contact information given.

## 2013-12-07 NOTE — Progress Notes (Signed)
Received radiology disk and placed in Dawn's box.

## 2013-12-08 ENCOUNTER — Ambulatory Visit
Admission: RE | Admit: 2013-12-08 | Discharge: 2013-12-08 | Disposition: A | Discharge status: Home / Self Care | Payer: Medicare Other | Source: Ambulatory Visit | Attending: Oncology | Admitting: Oncology

## 2013-12-08 DIAGNOSIS — D0590 Unspecified type of carcinoma in situ of unspecified breast: Secondary | ICD-10-CM | POA: Diagnosis not present

## 2013-12-08 DIAGNOSIS — D249 Benign neoplasm of unspecified breast: Secondary | ICD-10-CM | POA: Diagnosis not present

## 2013-12-08 DIAGNOSIS — R928 Other abnormal and inconclusive findings on diagnostic imaging of breast: Secondary | ICD-10-CM

## 2013-12-09 ENCOUNTER — Inpatient Hospital Stay: Admission: RE | Admit: 2013-12-09 | Payer: Medicare Other | Source: Ambulatory Visit

## 2013-12-13 ENCOUNTER — Encounter (INDEPENDENT_AMBULATORY_CARE_PROVIDER_SITE_OTHER): Payer: Self-pay | Admitting: Surgery

## 2013-12-13 ENCOUNTER — Ambulatory Visit (INDEPENDENT_AMBULATORY_CARE_PROVIDER_SITE_OTHER): Payer: Medicare Other | Admitting: Surgery

## 2013-12-13 VITALS — BP 132/88 | HR 88 | Temp 97.8°F | Resp 20 | Ht 64.0 in | Wt 263.0 lb

## 2013-12-13 DIAGNOSIS — D0590 Unspecified type of carcinoma in situ of unspecified breast: Secondary | ICD-10-CM

## 2013-12-13 DIAGNOSIS — D0510 Intraductal carcinoma in situ of unspecified breast: Secondary | ICD-10-CM

## 2013-12-13 NOTE — Progress Notes (Signed)
Patient ID: Meagan Wallace, female   DOB: 1945/12/09, 68 y.o.   MRN: [id number]  No chief complaint on file.   HPI Meagan Wallace is a 68 y.o. female.  Pt presents with DCIS to [facility] clinic today. No other complaints today. No history of breast issues.  No masses.  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  No complaints.  HPI  Past Medical History  Diagnosis Date  . Diabetes mellitus without complication   . Hypertension   . Cataract   . Stroke   . Arthritis   . Anemia   . Heart murmur   . Cancer     breast    Past Surgical History  Procedure Laterality Date  . Shoulder surgery    . Exploratory laparotomy      Family History  Problem Relation Age of Onset  . Breast cancer Paternal Aunt   . Heart disease Mother   . Stroke Father     Social History History  Substance Use Topics  . Smoking status: Never Smoker   . Smokeless tobacco: Not on file  . Alcohol Use: No    No Known Allergies  Current Outpatient Prescriptions  Medication Sig Dispense Refill  . ALBUTEROL SULFATE IN Inhale into the lungs.      Marland Kitchen amLODipine (NORVASC) 10 MG tablet Take 10 mg by mouth daily.      . Ascorbic Acid (VITAMIN C PO) Take by mouth.      Marland Kitchen aspirin 325 MG tablet Take 325 mg by mouth daily.      . Cholecalciferol (VITAMIN D PO) Take by mouth.      . Fluticasone-Salmeterol (ADVAIR) 100-50 MCG/DOSE AEPB Inhale 1 puff into the lungs 2 (two) times daily.      . hydrochlorothiazide (HYDRODIURIL) 25 MG tablet Take 25 mg by mouth daily.      . metFORMIN (GLUCOPHAGE) 500 MG tablet Take 500 mg by mouth 2 (two) times daily with a meal.      . ramipril (ALTACE) 10 MG capsule Take 10 mg by mouth 2 (two) times daily.      . sertraline (ZOLOFT) 100 MG tablet Take 100 mg by mouth daily.       No current facility-administered medications for this visit.    Review of Systems Review of Systems  Constitutional: Negative for fever, chills and unexpected weight  change.  HENT: Negative for congestion, hearing loss, sore throat, trouble swallowing and voice change.   Eyes: Negative for visual disturbance.  Respiratory: Negative for cough and wheezing.   Cardiovascular: Negative for chest pain, palpitations and leg swelling.  Gastrointestinal: Negative for nausea, vomiting, abdominal pain, diarrhea, constipation, blood in stool, abdominal distention and anal bleeding.  Genitourinary: Negative for hematuria, vaginal bleeding and difficulty urinating.  Musculoskeletal: Negative for arthralgias.  Skin: Negative for rash and wound.  Neurological: Negative for seizures, syncope and headaches.  Hematological: Negative for adenopathy. Does not bruise/bleed easily.  Psychiatric/Behavioral: Negative for confusion.    Blood pressure 132/88, pulse 88, temperature 97.8 F (36.6 C), resp. rate 20, height 5\' 4"  (1.626 m), weight 263 lb (119.296 kg).  Physical Exam Physical Exam  Constitutional: She is oriented to person, place, and time. She appears well-developed.  HENT:  Head: Normocephalic.  Mouth/Throat: No oropharyngeal exudate.  Eyes: EOM are normal. Pupils are equal, round, and reactive to light.  Neck: Normal range of motion. Neck supple.  Cardiovascular: Normal rate and regular rhythm.   Pulmonary/Chest:  Effort normal and breath sounds normal. Right breast exhibits no inverted nipple, no nipple discharge, no skin change and no tenderness. Left breast exhibits no inverted nipple, no mass, no nipple discharge, no skin change and no tenderness. Breasts are symmetrical.  Musculoskeletal: Normal range of motion.  Neurological: She is alert and oriented to person, place, and time.  Skin: Skin is warm and dry.  Psychiatric: She has a normal mood and affect. Her behavior is normal. Judgment and thought content normal.    Data Reviewed 1 CM AREA OF DISTORTION AND  PATH DCIS 1 CM RIGHT  Assessment    RIGHT DCIS 1 cm fibroadenoma    Plan     Discussed mastectomy vs breast conservation.  Pt would like to proceed with breast conservation. Right breast sed localized lumpectomy set up.  The procedure has been discussed with the patient. Alternatives to surgery have been discussed with the patient.  Risks of surgery include bleeding,  Infection,  Seroma formation, death,  and the need for further surgery.   The patient understands and wishes to proceed.        Catlynn Grondahl A. 12/13/2013, 2:59 PM

## 2013-12-13 NOTE — Patient Instructions (Signed)
Lumpectomy A lumpectomy is a form of "breast conserving" or "breast preservation" surgery. It may also be referred to as a partial mastectomy. During a lumpectomy, the portion of the breast that contains the cancerous tumor or breast mass (the lump) is removed. Some normal tissue around the lump may also be removed to make sure all the tumor has been removed. This surgery should take 40 minutes or less. LET YOUR HEALTH CARE PROVIDER KNOW ABOUT:  Any allergies you have.  All medicines you are taking, including vitamins, herbs, eye drops, creams, and over-the-counter medicines.  Previous problems you or members of your family have had with the use of anesthetics.  Any blood disorders you have.  Previous surgeries you have had.  Medical conditions you have. RISKS AND COMPLICATIONS Generally, this is a safe procedure. However, as with any procedure, complications can occur. Possible complications include:  Bleeding.  Infection.  Pain.  Temporary swelling.  Change in the shape of the breast, particularly if a large portion is removed. BEFORE THE PROCEDURE  Ask your health care provider about changing or stopping your regular medicines.  Do not eat or drink anything for 7 8 hours before the surgery or as directed by your health care provider. Ask your health care provider if you can take a sip of water with any approved medicines.  On the day of surgery, your healthcare provider will use a mammogram or ultrasound to locate and mark the tumor in your breast. These markings on your breast will show where the cut (incision) will be made. PROCEDURE   An IV tube will be put into one of your veins.  You may be given medicine to help you relax before the surgery (sedative). You will be given one of the following:  A medicine that numbs the area (local anesthesia).  A medicine that makes you go to sleep (general anesthesia).  Your health care provider will use a kind of electric scalpel  that uses heat to minimize bleeding (electrocautery knife).  A curved incision (like a smile or frown) that follows the natural curve of your breast is made, to allow for minimal scarring and better healing.  The tumor will be removed with some of the surrounding tissue. This will be sent to the lab for analysis. Your health care provider may also remove your lymph nodes at this time if needed.  Sometimes, but not always, a rubber tube called a drain will be surgically inserted into your breast area or armpit to collect excess fluid that may accumulate in the space where the tumor was. This drain is connected to a plastic bulb on the outside of your body. This drain creates suction to help remove the fluid.  The incisions will be closed with stitches (sutures).  A bandage may be placed over the incisions. AFTER THE PROCEDURE  You will be taken to the recovery area.  You will be given medicine for pain.  A small rubber drain may be placed in the breast for 2 3 days to prevent a collection of blood (hematoma) from developing in the breast. You will be given instructions on caring for the drain before you go home.  A pressure bandage (dressing) will be applied for 1 2 days to prevent bleeding. Ask your health care provider how to care for your bandage at home. Document Released: 11/11/2006 Document Revised: 06/02/2013 Document Reviewed: 03/05/2013 [employer] Patient Information 2014 [employer].  

## 2013-12-14 ENCOUNTER — Telehealth: Payer: Self-pay | Admitting: Oncology

## 2013-12-14 NOTE — Telephone Encounter (Signed)
, °

## 2013-12-17 ENCOUNTER — Other Ambulatory Visit (INDEPENDENT_AMBULATORY_CARE_PROVIDER_SITE_OTHER): Payer: Self-pay | Admitting: Surgery

## 2013-12-17 DIAGNOSIS — D0510 Intraductal carcinoma in situ of unspecified breast: Secondary | ICD-10-CM

## 2013-12-21 ENCOUNTER — Encounter (HOSPITAL_BASED_OUTPATIENT_CLINIC_OR_DEPARTMENT_OTHER): Payer: Self-pay | Admitting: *Deleted

## 2013-12-21 NOTE — Progress Notes (Signed)
Pt has seen cardiology due to htn and diabetes-denies sleep apnea-takes nyquil daily. To come in for labs and ekg-called for notes pcp and cardiology

## 2013-12-24 ENCOUNTER — Encounter (HOSPITAL_BASED_OUTPATIENT_CLINIC_OR_DEPARTMENT_OTHER)
Admission: RE | Admit: 2013-12-24 | Discharge: 2013-12-24 | Disposition: A | Discharge status: Home / Self Care | Payer: Medicare Other | Source: Ambulatory Visit

## 2013-12-24 ENCOUNTER — Ambulatory Visit
Admission: RE | Admit: 2013-12-24 | Discharge: 2013-12-24 | Disposition: A | Discharge status: Home / Self Care | Payer: Medicare Other | Source: Ambulatory Visit | Attending: Surgery | Admitting: Surgery

## 2013-12-24 DIAGNOSIS — Z8673 Personal history of transient ischemic attack (TIA), and cerebral infarction without residual deficits: Secondary | ICD-10-CM | POA: Diagnosis not present

## 2013-12-24 DIAGNOSIS — M1990 Unspecified osteoarthritis, unspecified site: Secondary | ICD-10-CM | POA: Diagnosis not present

## 2013-12-24 DIAGNOSIS — I252 Old myocardial infarction: Secondary | ICD-10-CM | POA: Diagnosis not present

## 2013-12-24 DIAGNOSIS — I10 Essential (primary) hypertension: Secondary | ICD-10-CM | POA: Diagnosis not present

## 2013-12-24 DIAGNOSIS — F411 Generalized anxiety disorder: Secondary | ICD-10-CM | POA: Diagnosis not present

## 2013-12-24 DIAGNOSIS — D0590 Unspecified type of carcinoma in situ of unspecified breast: Secondary | ICD-10-CM | POA: Diagnosis not present

## 2013-12-24 DIAGNOSIS — Z7982 Long term (current) use of aspirin: Secondary | ICD-10-CM | POA: Diagnosis not present

## 2013-12-24 DIAGNOSIS — Z01812 Encounter for preprocedural laboratory examination: Secondary | ICD-10-CM | POA: Diagnosis not present

## 2013-12-24 DIAGNOSIS — R011 Cardiac murmur, unspecified: Secondary | ICD-10-CM | POA: Diagnosis not present

## 2013-12-24 DIAGNOSIS — H269 Unspecified cataract: Secondary | ICD-10-CM | POA: Diagnosis not present

## 2013-12-24 DIAGNOSIS — J45909 Unspecified asthma, uncomplicated: Secondary | ICD-10-CM | POA: Diagnosis not present

## 2013-12-24 DIAGNOSIS — D0510 Intraductal carcinoma in situ of unspecified breast: Secondary | ICD-10-CM

## 2013-12-24 DIAGNOSIS — F3289 Other specified depressive episodes: Secondary | ICD-10-CM | POA: Diagnosis not present

## 2013-12-24 DIAGNOSIS — D649 Anemia, unspecified: Secondary | ICD-10-CM | POA: Diagnosis not present

## 2013-12-24 DIAGNOSIS — F329 Major depressive disorder, single episode, unspecified: Secondary | ICD-10-CM | POA: Diagnosis not present

## 2013-12-24 DIAGNOSIS — Z01810 Encounter for preprocedural cardiovascular examination: Secondary | ICD-10-CM | POA: Diagnosis not present

## 2013-12-24 DIAGNOSIS — E119 Type 2 diabetes mellitus without complications: Secondary | ICD-10-CM | POA: Diagnosis not present

## 2013-12-24 LAB — CBC WITH DIFFERENTIAL/PLATELET
BASOS PCT: 1 % (ref 0–1)
Basophils Absolute: 0.1 10*3/uL (ref 0.0–0.1)
EOS ABS: 0.1 10*3/uL (ref 0.0–0.7)
Eosinophils Relative: 1 % (ref 0–5)
HCT: 39.8 % (ref 36.0–46.0)
Hemoglobin: 13.2 g/dL (ref 12.0–15.0)
LYMPHS ABS: 2 10*3/uL (ref 0.7–4.0)
Lymphocytes Relative: 15 % (ref 12–46)
MCH: 27.1 pg (ref 26.0–34.0)
MCHC: 33.2 g/dL (ref 30.0–36.0)
MCV: 81.7 fL (ref 78.0–100.0)
Monocytes Absolute: 1.1 10*3/uL — ABNORMAL HIGH (ref 0.1–1.0)
Monocytes Relative: 8 % (ref 3–12)
NEUTROS PCT: 75 % (ref 43–77)
Neutro Abs: 9.9 10*3/uL — ABNORMAL HIGH (ref 1.7–7.7)
PLATELETS: 226 10*3/uL (ref 150–400)
RBC: 4.87 MIL/uL (ref 3.87–5.11)
RDW: 14.2 % (ref 11.5–15.5)
WBC: 13.2 10*3/uL — ABNORMAL HIGH (ref 4.0–10.5)

## 2013-12-24 LAB — COMPREHENSIVE METABOLIC PANEL
ALT: 17 U/L (ref 0–35)
AST: 17 U/L (ref 0–37)
Albumin: 3.6 g/dL (ref 3.5–5.2)
Alkaline Phosphatase: 94 U/L (ref 39–117)
BUN: 12 mg/dL (ref 6–23)
CO2: 29 mEq/L (ref 19–32)
Calcium: 10.2 mg/dL (ref 8.4–10.5)
Chloride: 98 mEq/L (ref 96–112)
Creatinine, Ser: 0.64 mg/dL (ref 0.50–1.10)
GFR calc Af Amer: >90 mL/min (ref >90)
GFR calc non Af Amer: 89 mL/min — ABNORMAL LOW (ref >90)
Glucose, Bld: 101 mg/dL — ABNORMAL HIGH (ref 70–99)
POTASSIUM: 4.4 meq/L (ref 3.7–5.3)
SODIUM: 141 meq/L (ref 137–147)
Total Bilirubin: 0.7 mg/dL (ref 0.3–1.2)
Total Protein: 7.4 g/dL (ref 6.0–8.3)

## 2013-12-27 ENCOUNTER — Encounter (HOSPITAL_BASED_OUTPATIENT_CLINIC_OR_DEPARTMENT_OTHER): Payer: Medicare Other | Admitting: Certified Registered"

## 2013-12-27 ENCOUNTER — Encounter (HOSPITAL_BASED_OUTPATIENT_CLINIC_OR_DEPARTMENT_OTHER)
Admission: RE | Disposition: A | Discharge status: Home / Self Care | Payer: Self-pay | Source: Ambulatory Visit | Attending: Surgery

## 2013-12-27 ENCOUNTER — Ambulatory Visit
Admission: RE | Admit: 2013-12-27 | Discharge: 2013-12-27 | Disposition: A | Discharge status: Home / Self Care | Payer: Medicare Other | Source: Ambulatory Visit | Attending: Surgery | Admitting: Surgery

## 2013-12-27 ENCOUNTER — Encounter (HOSPITAL_BASED_OUTPATIENT_CLINIC_OR_DEPARTMENT_OTHER): Payer: Self-pay | Admitting: *Deleted

## 2013-12-27 ENCOUNTER — Ambulatory Visit (HOSPITAL_BASED_OUTPATIENT_CLINIC_OR_DEPARTMENT_OTHER)
Admission: RE | Admit: 2013-12-27 | Discharge: 2013-12-27 | Disposition: A | Discharge status: Home / Self Care | Payer: Medicare Other | Source: Ambulatory Visit | Attending: Surgery | Admitting: Surgery

## 2013-12-27 ENCOUNTER — Ambulatory Visit (HOSPITAL_BASED_OUTPATIENT_CLINIC_OR_DEPARTMENT_OTHER): Payer: Medicare Other | Admitting: Certified Registered"

## 2013-12-27 DIAGNOSIS — H269 Unspecified cataract: Secondary | ICD-10-CM | POA: Diagnosis not present

## 2013-12-27 DIAGNOSIS — I10 Essential (primary) hypertension: Secondary | ICD-10-CM | POA: Diagnosis not present

## 2013-12-27 DIAGNOSIS — E119 Type 2 diabetes mellitus without complications: Secondary | ICD-10-CM | POA: Diagnosis not present

## 2013-12-27 DIAGNOSIS — Z01810 Encounter for preprocedural cardiovascular examination: Secondary | ICD-10-CM | POA: Insufficient documentation

## 2013-12-27 DIAGNOSIS — F411 Generalized anxiety disorder: Secondary | ICD-10-CM | POA: Insufficient documentation

## 2013-12-27 DIAGNOSIS — D0590 Unspecified type of carcinoma in situ of unspecified breast: Secondary | ICD-10-CM | POA: Insufficient documentation

## 2013-12-27 DIAGNOSIS — R011 Cardiac murmur, unspecified: Secondary | ICD-10-CM | POA: Insufficient documentation

## 2013-12-27 DIAGNOSIS — M1990 Unspecified osteoarthritis, unspecified site: Secondary | ICD-10-CM | POA: Insufficient documentation

## 2013-12-27 DIAGNOSIS — D0510 Intraductal carcinoma in situ of unspecified breast: Secondary | ICD-10-CM

## 2013-12-27 DIAGNOSIS — Z7982 Long term (current) use of aspirin: Secondary | ICD-10-CM | POA: Insufficient documentation

## 2013-12-27 DIAGNOSIS — D649 Anemia, unspecified: Secondary | ICD-10-CM | POA: Insufficient documentation

## 2013-12-27 DIAGNOSIS — F3289 Other specified depressive episodes: Secondary | ICD-10-CM | POA: Insufficient documentation

## 2013-12-27 DIAGNOSIS — F329 Major depressive disorder, single episode, unspecified: Secondary | ICD-10-CM | POA: Insufficient documentation

## 2013-12-27 DIAGNOSIS — Z8673 Personal history of transient ischemic attack (TIA), and cerebral infarction without residual deficits: Secondary | ICD-10-CM | POA: Insufficient documentation

## 2013-12-27 DIAGNOSIS — J45909 Unspecified asthma, uncomplicated: Secondary | ICD-10-CM | POA: Insufficient documentation

## 2013-12-27 DIAGNOSIS — Z01812 Encounter for preprocedural laboratory examination: Secondary | ICD-10-CM | POA: Insufficient documentation

## 2013-12-27 DIAGNOSIS — I252 Old myocardial infarction: Secondary | ICD-10-CM | POA: Insufficient documentation

## 2013-12-27 HISTORY — DX: Major depressive disorder, single episode, unspecified: F32.9

## 2013-12-27 HISTORY — DX: Other specified postprocedural states: R11.2

## 2013-12-27 HISTORY — PX: BREAST LUMPECTOMY WITH RADIOACTIVE SEED LOCALIZATION: SHX6424

## 2013-12-27 HISTORY — DX: Anxiety disorder, unspecified: F41.9

## 2013-12-27 HISTORY — DX: Depression, unspecified: F32.A

## 2013-12-27 HISTORY — DX: Unspecified asthma, uncomplicated: J45.909

## 2013-12-27 HISTORY — DX: Other specified postprocedural states: Z98.890

## 2013-12-27 LAB — GLUCOSE, CAPILLARY
GLUCOSE-CAPILLARY: 129 mg/dL — AB (ref 70–99)
Glucose-Capillary: 146 mg/dL — ABNORMAL HIGH (ref 70–99)

## 2013-12-27 SURGERY — BREAST LUMPECTOMY WITH RADIOACTIVE SEED LOCALIZATION
Anesthesia: General | Site: Breast | Laterality: Right

## 2013-12-27 MED ORDER — OXYCODONE-ACETAMINOPHEN 5-325 MG PO TABS: 1.0000 | ORAL_TABLET | ORAL | Status: DC | PRN

## 2013-12-27 MED ORDER — CEFAZOLIN SODIUM-DEXTROSE 2-3 GM-% IV SOLR
2.0000 g | INTRAVENOUS | Status: AC
  Administered 2013-12-27: 2 g via INTRAVENOUS

## 2013-12-27 MED ORDER — SUCCINYLCHOLINE CHLORIDE 20 MG/ML IJ SOLN
INTRAMUSCULAR | Status: DC | PRN
  Administered 2013-12-27: 100 mg via INTRAVENOUS

## 2013-12-27 MED ORDER — MIDAZOLAM HCL 2 MG/2ML IJ SOLN: 1.0000 mg | INTRAMUSCULAR | Status: DC | PRN

## 2013-12-27 MED ORDER — LACTATED RINGERS IV SOLN
INTRAVENOUS | Status: DC
  Administered 2013-12-27 (×2): via INTRAVENOUS

## 2013-12-27 MED ORDER — MIDAZOLAM HCL 2 MG/2ML IJ SOLN
INTRAMUSCULAR | Status: AC
  Filled 2013-12-27: qty 2

## 2013-12-27 MED ORDER — CHLORHEXIDINE GLUCONATE 4 % EX LIQD: 1.0000 "application " | Freq: Once | CUTANEOUS | Status: DC

## 2013-12-27 MED ORDER — FENTANYL CITRATE 0.05 MG/ML IJ SOLN
INTRAMUSCULAR | Status: AC
  Filled 2013-12-27: qty 4

## 2013-12-27 MED ORDER — EPHEDRINE SULFATE 50 MG/ML IJ SOLN
INTRAMUSCULAR | Status: DC | PRN
  Administered 2013-12-27 (×3): 10 mg via INTRAVENOUS

## 2013-12-27 MED ORDER — ONDANSETRON HCL 4 MG/2ML IJ SOLN
INTRAMUSCULAR | Status: DC | PRN
  Administered 2013-12-27: 4 mg via INTRAVENOUS

## 2013-12-27 MED ORDER — DEXAMETHASONE SODIUM PHOSPHATE 4 MG/ML IJ SOLN
INTRAMUSCULAR | Status: DC | PRN
  Administered 2013-12-27: 4 mg via INTRAVENOUS

## 2013-12-27 MED ORDER — FENTANYL CITRATE 0.05 MG/ML IJ SOLN
INTRAMUSCULAR | Status: DC | PRN
  Administered 2013-12-27 (×2): 50 ug via INTRAVENOUS

## 2013-12-27 MED ORDER — BUPIVACAINE-EPINEPHRINE PF 0.25-1:200000 % IJ SOLN
INTRAMUSCULAR | Status: AC
  Filled 2013-12-27: qty 30

## 2013-12-27 MED ORDER — OXYCODONE HCL 5 MG/5ML PO SOLN: 5.0000 mg | Freq: Once | ORAL | Status: DC | PRN

## 2013-12-27 MED ORDER — HYDROMORPHONE HCL PF 1 MG/ML IJ SOLN: 0.2500 mg | INTRAMUSCULAR | Status: DC | PRN

## 2013-12-27 MED ORDER — MIDAZOLAM HCL 5 MG/5ML IJ SOLN
INTRAMUSCULAR | Status: DC | PRN
  Administered 2013-12-27: 2 mg via INTRAVENOUS

## 2013-12-27 MED ORDER — SCOPOLAMINE 1 MG/3DAYS TD PT72
MEDICATED_PATCH | TRANSDERMAL | Status: DC | PRN
  Administered 2013-12-27: 1 via TRANSDERMAL

## 2013-12-27 MED ORDER — BUPIVACAINE-EPINEPHRINE PF 0.25-1:200000 % IJ SOLN
INTRAMUSCULAR | Status: DC | PRN
  Administered 2013-12-27: 30 mL

## 2013-12-27 MED ORDER — SCOPOLAMINE 1 MG/3DAYS TD PT72
MEDICATED_PATCH | TRANSDERMAL | Status: AC
  Filled 2013-12-27: qty 1

## 2013-12-27 MED ORDER — METOCLOPRAMIDE HCL 5 MG/ML IJ SOLN: 10.0000 mg | Freq: Once | INTRAMUSCULAR | Status: DC | PRN

## 2013-12-27 MED ORDER — OXYCODONE HCL 5 MG PO TABS: 5.0000 mg | ORAL_TABLET | Freq: Once | ORAL | Status: DC | PRN

## 2013-12-27 MED ORDER — PROPOFOL 10 MG/ML IV BOLUS
INTRAVENOUS | Status: DC | PRN
  Administered 2013-12-27: 200 mg via INTRAVENOUS
  Administered 2013-12-27 (×2): 50 mg via INTRAVENOUS

## 2013-12-27 MED ORDER — CEFAZOLIN SODIUM-DEXTROSE 2-3 GM-% IV SOLR
INTRAVENOUS | Status: AC
  Filled 2013-12-27: qty 50

## 2013-12-27 MED ORDER — PROPOFOL 10 MG/ML IV BOLUS
INTRAVENOUS | Status: AC
  Filled 2013-12-27: qty 20

## 2013-12-27 MED ORDER — FENTANYL CITRATE 0.05 MG/ML IJ SOLN: 50.0000 ug | INTRAMUSCULAR | Status: DC | PRN

## 2013-12-27 SURGICAL SUPPLY — 56 items
ADH SKN CLS APL DERMABOND .7 (GAUZE/BANDAGES/DRESSINGS) ×1
APPLIER CLIP 9.375 MED OPEN (MISCELLANEOUS)
APR CLP MED 9.3 20 MLT OPN (MISCELLANEOUS)
BINDER BREAST LRG (GAUZE/BANDAGES/DRESSINGS) IMPLANT
BINDER BREAST MEDIUM (GAUZE/BANDAGES/DRESSINGS) IMPLANT
BINDER BREAST XLRG (GAUZE/BANDAGES/DRESSINGS) IMPLANT
BINDER BREAST XXLRG (GAUZE/BANDAGES/DRESSINGS) ×2 IMPLANT
BLADE SURG 15 STRL LF DISP TIS (BLADE) ×1 IMPLANT
BLADE SURG 15 STRL SS (BLADE) ×3
CANISTER SUC SOCK COL 7IN (MISCELLANEOUS) ×1 IMPLANT
CANISTER SUCT 1200ML W/VALVE (MISCELLANEOUS) ×3 IMPLANT
CHLORAPREP W/TINT 26ML (MISCELLANEOUS) ×3 IMPLANT
CLIP APPLIE 9.375 MED OPEN (MISCELLANEOUS) IMPLANT
CLIP TI WIDE RED SMALL 6 (CLIP) ×1 IMPLANT
COVER MAYO STAND STRL (DRAPES) ×3 IMPLANT
COVER PROBE W GEL 5X96 (DRAPES) ×3 IMPLANT
COVER TABLE BACK 60X90 (DRAPES) ×3 IMPLANT
DECANTER SPIKE VIAL GLASS SM (MISCELLANEOUS) IMPLANT
DERMABOND ADVANCED (GAUZE/BANDAGES/DRESSINGS) ×2
DERMABOND ADVANCED .7 DNX12 (GAUZE/BANDAGES/DRESSINGS) ×1 IMPLANT
DEVICE DUBIN W/COMP PLATE 8390 (MISCELLANEOUS) ×3 IMPLANT
DRAPE LAPAROSCOPIC ABDOMINAL (DRAPES) IMPLANT
DRAPE PED LAPAROTOMY (DRAPES) ×3 IMPLANT
DRAPE UTILITY XL STRL (DRAPES) ×3 IMPLANT
ELECT COATED BLADE 2.86 ST (ELECTRODE) ×3 IMPLANT
ELECT REM PT RETURN 9FT ADLT (ELECTROSURGICAL) ×3
ELECTRODE REM PT RTRN 9FT ADLT (ELECTROSURGICAL) ×1 IMPLANT
GLOVE BIO SURGEON STRL SZ7 (GLOVE) ×2 IMPLANT
GLOVE BIOGEL PI IND STRL 7.5 (GLOVE) IMPLANT
GLOVE BIOGEL PI IND STRL 8 (GLOVE) ×1 IMPLANT
GLOVE BIOGEL PI INDICATOR 7.5 (GLOVE) ×2
GLOVE BIOGEL PI INDICATOR 8 (GLOVE) ×2
GLOVE ECLIPSE 8.0 STRL XLNG CF (GLOVE) ×5 IMPLANT
GLOVE EXAM NITRILE EXT CUFF MD (GLOVE) ×2 IMPLANT
GOWN STRL REUS W/ TWL LRG LVL3 (GOWN DISPOSABLE) ×2 IMPLANT
GOWN STRL REUS W/TWL LRG LVL3 (GOWN DISPOSABLE) ×6
KIT MARKER MARGIN INK (KITS) ×3 IMPLANT
NDL HYPO 25X1 1.5 SAFETY (NEEDLE) IMPLANT
NEEDLE HYPO 25X1 1.5 SAFETY (NEEDLE) ×3 IMPLANT
NS IRRIG 1000ML POUR BTL (IV SOLUTION) ×3 IMPLANT
PACK BASIN DAY SURGERY FS (CUSTOM PROCEDURE TRAY) ×3 IMPLANT
PENCIL BUTTON HOLSTER BLD 10FT (ELECTRODE) ×3 IMPLANT
PIN SAFETY STERILE (MISCELLANEOUS) IMPLANT
SLEEVE SCD COMPRESS KNEE MED (MISCELLANEOUS) ×3 IMPLANT
SPONGE LAP 4X18 X RAY DECT (DISPOSABLE) ×3 IMPLANT
STAPLER VISISTAT 35W (STAPLE) IMPLANT
SUT MNCRL AB 4-0 PS2 18 (SUTURE) ×3 IMPLANT
SUT SILK 2 0 SH (SUTURE) IMPLANT
SUT VIC AB 3-0 SH 27 (SUTURE) ×3
SUT VIC AB 3-0 SH 27X BRD (SUTURE) ×1 IMPLANT
SYR CONTROL 10ML LL (SYRINGE) ×2 IMPLANT
TOWEL OR 17X24 6PK STRL BLUE (TOWEL DISPOSABLE) ×3 IMPLANT
TOWEL OR NON WOVEN STRL DISP B (DISPOSABLE) ×1 IMPLANT
TUBE CONNECTING 20'X1/4 (TUBING) ×1
TUBE CONNECTING 20X1/4 (TUBING) ×1 IMPLANT
YANKAUER SUCT BULB TIP NO VENT (SUCTIONS) ×2 IMPLANT

## 2013-12-27 NOTE — H&P (View-Only) (Signed)
Patient ID: Meagan Wallace, female   DOB: 1945/12/09, 68 y.o.   MRN: [id number]  No chief complaint on file.   HPI Meagan Wallace is a 68 y.o. female.  Pt presents with DCIS to [facility] clinic today. No other complaints today. No history of breast issues.  No masses.  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  No complaints.  HPI  Past Medical History  Diagnosis Date  . Diabetes mellitus without complication   . Hypertension   . Cataract   . Stroke   . Arthritis   . Anemia   . Heart murmur   . Cancer     breast    Past Surgical History  Procedure Laterality Date  . Shoulder surgery    . Exploratory laparotomy      Family History  Problem Relation Age of Onset  . Breast cancer Paternal Aunt   . Heart disease Mother   . Stroke Father     Social History History  Substance Use Topics  . Smoking status: Never Smoker   . Smokeless tobacco: Not on file  . Alcohol Use: No    No Known Allergies  Current Outpatient Prescriptions  Medication Sig Dispense Refill  . ALBUTEROL SULFATE IN Inhale into the lungs.      Marland Kitchen amLODipine (NORVASC) 10 MG tablet Take 10 mg by mouth daily.      . Ascorbic Acid (VITAMIN C PO) Take by mouth.      Marland Kitchen aspirin 325 MG tablet Take 325 mg by mouth daily.      . Cholecalciferol (VITAMIN D PO) Take by mouth.      . Fluticasone-Salmeterol (ADVAIR) 100-50 MCG/DOSE AEPB Inhale 1 puff into the lungs 2 (two) times daily.      . hydrochlorothiazide (HYDRODIURIL) 25 MG tablet Take 25 mg by mouth daily.      . metFORMIN (GLUCOPHAGE) 500 MG tablet Take 500 mg by mouth 2 (two) times daily with a meal.      . ramipril (ALTACE) 10 MG capsule Take 10 mg by mouth 2 (two) times daily.      . sertraline (ZOLOFT) 100 MG tablet Take 100 mg by mouth daily.       No current facility-administered medications for this visit.    Review of Systems Review of Systems  Constitutional: Negative for fever, chills and unexpected weight  change.  HENT: Negative for congestion, hearing loss, sore throat, trouble swallowing and voice change.   Eyes: Negative for visual disturbance.  Respiratory: Negative for cough and wheezing.   Cardiovascular: Negative for chest pain, palpitations and leg swelling.  Gastrointestinal: Negative for nausea, vomiting, abdominal pain, diarrhea, constipation, blood in stool, abdominal distention and anal bleeding.  Genitourinary: Negative for hematuria, vaginal bleeding and difficulty urinating.  Musculoskeletal: Negative for arthralgias.  Skin: Negative for rash and wound.  Neurological: Negative for seizures, syncope and headaches.  Hematological: Negative for adenopathy. Does not bruise/bleed easily.  Psychiatric/Behavioral: Negative for confusion.    Blood pressure 132/88, pulse 88, temperature 97.8 F (36.6 C), resp. rate 20, height 5\' 4"  (1.626 m), weight 263 lb (119.296 kg).  Physical Exam Physical Exam  Constitutional: She is oriented to person, place, and time. She appears well-developed.  HENT:  Head: Normocephalic.  Mouth/Throat: No oropharyngeal exudate.  Eyes: EOM are normal. Pupils are equal, round, and reactive to light.  Neck: Normal range of motion. Neck supple.  Cardiovascular: Normal rate and regular rhythm.   Pulmonary/Chest:  Effort normal and breath sounds normal. Right breast exhibits no inverted nipple, no nipple discharge, no skin change and no tenderness. Left breast exhibits no inverted nipple, no mass, no nipple discharge, no skin change and no tenderness. Breasts are symmetrical.  Musculoskeletal: Normal range of motion.  Neurological: She is alert and oriented to person, place, and time.  Skin: Skin is warm and dry.  Psychiatric: She has a normal mood and affect. Her behavior is normal. Judgment and thought content normal.    Data Reviewed 1 CM AREA OF DISTORTION AND  PATH DCIS 1 CM RIGHT  Assessment    RIGHT DCIS 1 cm fibroadenoma    Plan     Discussed mastectomy vs breast conservation.  Pt would like to proceed with breast conservation. Right breast sed localized lumpectomy set up.  The procedure has been discussed with the patient. Alternatives to surgery have been discussed with the patient.  Risks of surgery include bleeding,  Infection,  Seroma formation, death,  and the need for further surgery.   The patient understands and wishes to proceed.        Meagan Wallace A. 12/13/2013, 2:59 PM

## 2013-12-27 NOTE — Anesthesia Preprocedure Evaluation (Signed)
Anesthesia Evaluation  Patient identified by MRN, date of birth, ID band Patient awake    Reviewed: Allergy & Precautions, H&P , NPO status , Patient's Chart, lab work & pertinent test results, reviewed documented beta blocker date and time   History of Anesthesia Complications (+) PONV and history of anesthetic complications  Airway Mallampati: II TM Distance: >3 FB Neck ROM: full    Dental   Pulmonary asthma ,  breath sounds clear to auscultation        Cardiovascular hypertension, On Medications negative cardio ROS  + Valvular Problems/Murmurs Rhythm:regular     Neuro/Psych PSYCHIATRIC DISORDERS Anxiety Depression CVA    GI/Hepatic negative GI ROS, Neg liver ROS,   Endo/Other  diabetes, Oral Hypoglycemic AgentsMorbid obesity  Renal/GU negative Renal ROS  negative genitourinary   Musculoskeletal   Abdominal   Peds  Hematology  (+) anemia ,   Anesthesia Other Findings See surgeon's H&P   Reproductive/Obstetrics negative OB ROS                           Anesthesia Physical Anesthesia Plan  ASA: III  Anesthesia Plan: General   Post-op Pain Management:    Induction: Intravenous  Airway Management Planned: Oral ETT and LMA  Additional Equipment:   Intra-op Plan:   Post-operative Plan: Extubation in OR  Informed Consent: I have reviewed the patients History and Physical, chart, labs and discussed the procedure including the risks, benefits and alternatives for the proposed anesthesia with the patient or authorized representative who has indicated his/her understanding and acceptance.   Dental Advisory Given  Plan Discussed with: CRNA and Surgeon  Anesthesia Plan Comments:         Anesthesia Quick Evaluation

## 2013-12-27 NOTE — Brief Op Note (Signed)
12/27/2013  8:44 AM  PATIENT:  [name]  68 y.o. 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 Nyshaun Standage, MD - Primary      ANESTHESIA:   local and general  EBL:  Total I/O In: 1000 [I.V.:1000] Out: -    LOCAL MEDICATIONS USED:  BUPIVICAINE   SPECIMEN:  Source of Specimen:  right breast  DISPOSITION OF SPECIMEN:  PATHOLOGY  COUNTS:  YES  TOURNIQUET:  * No tourniquets in log *  DICTATION: .Other Dictation: Dictation Number [id number]  PLAN OF CARE: Discharge to home after PACU  PATIENT DISPOSITION:  PACU - hemodynamically stable.   Delay start of Pharmacological VTE agent (>24hrs) due to surgical blood loss or risk of bleeding: not applicable

## 2013-12-27 NOTE — Interval H&P Note (Signed)
History and Physical Interval Note:  12/27/2013 6:50 AM  Meagan Wallace  has presented today for surgery, with the diagnosis of right DCIS  The various methods of treatment have been discussed with the patient and family. After consideration of risks, benefits and other options for treatment, the patient has consented to  Procedure(s): BREAST LUMPECTOMY WITH RADIOACTIVE SEED LOCALIZATION (Right) as a surgical intervention .  The patient's history has been reviewed, patient examined, no change in status, stable for surgery.  I have reviewed the patient's chart and labs.  Questions were answered to the patient's satisfaction.     Jadriel Saxer A.

## 2013-12-27 NOTE — Anesthesia Postprocedure Evaluation (Signed)
Anesthesia Post Note  Patient: Meagan Wallace  Procedure(s) Performed: Procedure(s) (LRB): BREAST LUMPECTOMY WITH RADIOACTIVE SEED LOCALIZATION (Right)  Anesthesia type: General  Patient location: PACU  Post pain: Pain level controlled  Post assessment: Patient's Cardiovascular Status Stable  Last Vitals:  Filed Vitals:   12/27/13 1000  BP: 106/58  Pulse: 81  Temp:   Resp: 12    Post vital signs: Reviewed and stable  Level of consciousness: alert  Complications: No apparent anesthesia complications

## 2013-12-27 NOTE — Discharge Instructions (Signed)
[facility] °Office Phone Number [phone] ° °BREAST BIOPSY/ PARTIAL MASTECTOMY: POST OP INSTRUCTIONS ° °Always review your discharge instruction sheet given to you by the facility where your surgery was performed. ° °IF YOU HAVE DISABILITY OR FAMILY LEAVE FORMS, YOU MUST BRING THEM TO THE OFFICE FOR PROCESSING.  DO NOT GIVE THEM TO YOUR DOCTOR. ° °1. A prescription for pain medication may be given to you upon discharge.  Take your pain medication as prescribed, if needed.  If narcotic pain medicine is not needed, then you may take acetaminophen (Tylenol) or ibuprofen (Advil) as needed. °2. Take your usually prescribed medications unless otherwise directed °3. If you need a refill on your pain medication, please contact your pharmacy.  They will contact our office to request authorization.  Prescriptions will not be filled after 5pm or on week-ends. °4. You should eat very light the first 24 hours after surgery, such as soup, crackers, pudding, etc.  Resume your normal diet the day after surgery. °5. Most patients will experience some swelling and bruising in the breast.  Ice packs and a good support bra will help.  Swelling and bruising can take several days to resolve.  °6. It is common to experience some constipation if taking pain medication after surgery.  Increasing fluid intake and taking a stool softener will usually help or prevent this problem from occurring.  A mild laxative (Milk of Magnesia or Miralax) should be taken according to package directions if there are no bowel movements after 48 hours. °7. Unless discharge instructions indicate otherwise, you may remove your bandages 24-48 hours after surgery, and you may shower at that time.  You may have steri-strips (small skin tapes) in place directly over the incision.  These strips should be left on the skin for 7-10 days.  If your surgeon used skin glue on the incision, you may shower in 24 hours.  The glue will flake off over the  next 2-3 weeks.  Any sutures or staples will be removed at the office during your follow-up visit. °8. ACTIVITIES:  You may resume regular daily activities (gradually increasing) beginning the next day.  Wearing a good support bra or sports bra minimizes pain and swelling.  You may have sexual intercourse when it is comfortable. °a. You may drive when you no longer are taking prescription pain medication, you can comfortably wear a seatbelt, and you can safely maneuver your car and apply brakes. °b. RETURN TO WORK:  ______________________________________________________________________________________ °9. You should see your doctor in the office for a follow-up appointment approximately two weeks after your surgery.  Your doctor’s nurse will typically make your follow-up appointment when she calls you with your pathology report.  Expect your pathology report 2-3 business days after your surgery.  You may call to check if you do not hear from us after three days. °10. OTHER INSTRUCTIONS: _______________________________________________________________________________________________ _____________________________________________________________________________________________________________________________________ °_____________________________________________________________________________________________________________________________________ °_____________________________________________________________________________________________________________________________________ ° °WHEN TO CALL YOUR DOCTOR: °1. Fever over 101.0 °2. Nausea and/or vomiting. °3. Extreme swelling or bruising. °4. Continued bleeding from incision. °5. Increased pain, redness, or drainage from the incision. ° °The clinic staff is available to answer your questions during regular business hours.  Please don’t hesitate to call and ask to speak to one of the nurses for clinical concerns.  If you have a medical emergency, go to the nearest  emergency room or call 911.  A surgeon from [facility] is always on call at the hospital. ° °For further questions, please visit [email or web address]  ° ° °  Post Anesthesia Home Care Instructions ° °Activity: °Get plenty of rest for the remainder of the day. A responsible adult should stay with you for 24 hours following the procedure.  °For the next 24 hours, DO NOT: °-Drive a car °-Operate machinery °-Drink alcoholic beverages °-Take any medication unless instructed by your physician °-Make any legal decisions or sign important papers. ° °Meals: °Start with liquid foods such as gelatin or soup. Progress to regular foods as tolerated. Avoid greasy, spicy, heavy foods. If nausea and/or vomiting occur, drink only clear liquids until the nausea and/or vomiting subsides. Call your physician if vomiting continues. ° °Special Instructions/Symptoms: °Your throat may feel dry or sore from the anesthesia or the breathing tube placed in your throat during surgery. If this causes discomfort, gargle with warm salt water. The discomfort should disappear within 24 hours. ° °

## 2013-12-27 NOTE — Anesthesia Procedure Notes (Signed)
Procedure Name: Intubation Date/Time: 12/27/2013 7:38 AM Performed by: Lyndee Leo Pre-anesthesia Checklist: Patient identified, Emergency Drugs available, Suction available and Patient being monitored Patient Re-evaluated:Patient Re-evaluated prior to inductionOxygen Delivery Method: Circle System Utilized Preoxygenation: Pre-oxygenation with 100% oxygen Intubation Type: IV induction Ventilation: Mask ventilation without difficulty Laryngoscope Size: Mac and 3 Grade View: Grade II Tube type: Oral Number of attempts: 1 Airway Equipment and Method: stylet and oral airway Placement Confirmation: ETT inserted through vocal cords under direct vision,  positive ETCO2 and breath sounds checked- equal and bilateral Secured at: 21 cm Tube secured with: Tape Dental Injury: Teeth and Oropharynx as per pre-operative assessment

## 2013-12-27 NOTE — Op Note (Signed)
NAMEMASIAH, PURGASON           ACCOUNT NO.:  [id number]  MEDICAL RECORD NO.:  [id number]  LOCATION:                                 FACILITY:  PHYSICIAN:  Maisie Fus A. Rochel Privett, M.D.DATE OF BIRTH:  December 05, 1945  DATE OF PROCEDURE:  12/27/2013 DATE OF DISCHARGE:  12/27/2013                              OPERATIVE REPORT   PREOPERATIVE DIAGNOSIS:  Right breast ductal carcinoma in situ.  POSTOPERATIVE DIAGNOSIS:  Right breast ductal carcinoma in situ.  PROCEDURE:  Right breast radioactive seed localization  LUMPECTOMY of right breast ductal carcinoma in situ.  SURGEON:  Maisie Fus A. Rhonin Trott, M.D.  ANESTHESIA:  General endotracheal anesthesia with 0.25% Sensorcaine local with epinephrine.  ESTIMATED BLOOD LOSS:  Approximately 30 mL.  SPECIMENS:  Right breast tissue with localized seed, verified by Neoprobe and radiographic examination in the operating room.  There were no background counts of radioactivity remaining in the breast once this was removed.  DRAINS:  None.  IV FLUIDS:  400 mL crystalloid.  INDICATIONS FOR PROCEDURE:  The patient is a 68 year old female with a 1.2 cm focus of right breast DCIS.  She wished to undergo breast conservation, and want to proceed with lumpectomy with radiation therapy to follow.  We discussed localization techniques of wire localization versus seed localization.  We discussed the pros and cons of each as well.  She wished to proceed with seed localization of right breast DCIS.  Risks, benefits, and alternatives were discussed, as well as mastectomy and reconstruction as an option as well.  She agreed to proceed.  DESCRIPTION OF PROCEDURE:  The patient underwent seed localization 3 days ago at the [facility] [place].  She presented to the [facility] for excision.  She was met in the holding area.  Neoprobe was used to verify the seed, still was placed properly and had radioactivity which did via the Neoprobe  in the holding area.  Questions were then answered, and right breast was marked as well at the same time.  She was taken back to the OR, and placed supine on the OR table.  General anesthesia was initiated without difficulty.  The right breast was prepped and draped in sterile fashion.  Time-out was done.  She received 2 g of Ancef.  Neoprobe was used and a hot spot was identified in the central medial right breast just below the nipple. .  Curvilinear incision was made along the medial border of the nipple.  We used THE NEOprobe to dissect down to the seed.  It was quite deep and toward the chest wall.  We were able to get hold of the tissue  down below it and around it.  I had to mobilize some of the breast tissue medially to do this.  Once I was able to identify the seed, I excised all tissue around it for at least a centimeter. Radiograph was then obtained with specimen removed and painted for margin and orientation which showed the seed to be in specimen.  Neoprobe placed back in the breast and no activity was  obtained.  There was a pedicle of breast tissue that I  placed  into the defect which filled it  very nicely.  Irrigation was used and hemostasis was achieved.  I closed the deep layers with 3-0 Vicryl and 4-0 Monocryl was used to close the skin in subcuticular fashion.  Dermabond was applied. Of note, the cavity was clipped with small clips for radiation therapy down the road.  Binder was then placed after placement of Dermabond on the incision.  All final counts were found to be correct of sponge, needle, and instruments.  She was awoke, extubated, taken to recovery in satisfactory condition.     Yasmina Chico A. Desmon Hitchner, M.D.     TAC/MEDQ  D:  12/27/2013  T:  12/27/2013  Job:  [id number]

## 2013-12-27 NOTE — Transfer of Care (Signed)
Immediate Anesthesia Transfer of Care Note  Patient: Meagan Wallace  Procedure(s) Performed: Procedure(s): BREAST LUMPECTOMY WITH RADIOACTIVE SEED LOCALIZATION (Right)  Patient Location: PACU  Anesthesia Type:General  Level of Consciousness: awake, sedated and patient cooperative  Airway & Oxygen Therapy: Patient Spontanous Breathing and Patient connected to face mask oxygen  Post-op Assessment: Report given to PACU RN and Post -op Vital signs reviewed and stable  Post vital signs: Reviewed and stable  Complications: No apparent anesthesia complications

## 2013-12-28 ENCOUNTER — Ambulatory Visit: Payer: Medicare Other | Admitting: Oncology

## 2013-12-28 ENCOUNTER — Telehealth (INDEPENDENT_AMBULATORY_CARE_PROVIDER_SITE_OTHER): Payer: Self-pay

## 2013-12-28 ENCOUNTER — Encounter (HOSPITAL_BASED_OUTPATIENT_CLINIC_OR_DEPARTMENT_OTHER): Payer: Self-pay | Admitting: Surgery

## 2013-12-28 NOTE — Telephone Encounter (Signed)
Message copied by Carlene Coria on Tue Dec 28, 2013  1:55 PM ------      Message from: Erroll Luna A      Created: Tue Dec 28, 2013 12:34 PM       No invasive cancer.  Only DCIS  Very small.  One margin close but negative.  Will discuss with oncology / radiation further. LOOKS GOOD  OVERALL ------

## 2013-12-28 NOTE — Telephone Encounter (Signed)
Spoke to pt and gave her path results below.

## 2014-01-12 ENCOUNTER — Ambulatory Visit: Payer: Medicare Other | Admitting: Radiation Oncology

## 2014-01-12 NOTE — Progress Notes (Signed)
Location of Breast Cancer:Right Breast Mass in upper inner quadrant measures 1.4 cm.  Histology per Pathology Report: 12/08/2013 Diagnosis Breast, right, needle core biopsy, lower outer quadrant middle 1/3 - HYALINIZED FIBROADENOMA WITH ASSOCIATED CALCIFICATIONS. - NO ATYPIA OR MALIGNANCY IDENTIFIED.  11/11/2013 :stereotactic biopsy with clip placement. Right 12 o'clock needle core biopsy/ductal carcinoma in-situ with calcifications.complex sclerosing lesion.  Receptor Status: ER(-), PR (-), Her2-neu ()  Did patient present with symptoms found on screening mammogram 09/29/13. Ultrasound right breast 10/29/13.  Past/Anticipated interventions by surgeon, if any:Right breast lumpectomy with radioactive seed localization on 12/27/2013 by Dr.Thomas Cornett   Past/Anticipated interventions by medical oncology, if any: Chemotherapy:follow up with Dr.Kahn on 01/24/14.  Lymphedema issues, if any: Intermittent tingling of right upper arm.  Pain issues, if any:No  SAFETY ISSUES:  Prior radiation :  Pacemaker/ICD?no   Possible current pregnancy?no  Is the patient on methotrexate? no  Current Complaints / other details:Married, no children , no pregnancies.Menarche age [age].Last menstrual period 20 years and was on pill 15 years prior.    ,  Marie, RN 01/12/2014,10:52 AM   

## 2014-01-13 ENCOUNTER — Encounter: Payer: Self-pay | Admitting: Radiation Oncology

## 2014-01-13 ENCOUNTER — Ambulatory Visit
Admission: RE | Admit: 2014-01-13 | Discharge: 2014-01-13 | Disposition: A | Discharge status: Home / Self Care | Payer: Medicare Other | Source: Ambulatory Visit | Attending: Radiation Oncology | Admitting: Radiation Oncology

## 2014-01-13 ENCOUNTER — Telehealth: Payer: Self-pay | Admitting: *Deleted

## 2014-01-13 DIAGNOSIS — R5381 Other malaise: Secondary | ICD-10-CM | POA: Insufficient documentation

## 2014-01-13 DIAGNOSIS — C50211 Malignant neoplasm of upper-inner quadrant of right female breast: Secondary | ICD-10-CM

## 2014-01-13 DIAGNOSIS — D0510 Intraductal carcinoma in situ of unspecified breast: Secondary | ICD-10-CM

## 2014-01-13 DIAGNOSIS — D0590 Unspecified type of carcinoma in situ of unspecified breast: Secondary | ICD-10-CM | POA: Diagnosis not present

## 2014-01-13 DIAGNOSIS — R5383 Other fatigue: Secondary | ICD-10-CM

## 2014-01-13 DIAGNOSIS — Z79899 Other long term (current) drug therapy: Secondary | ICD-10-CM | POA: Insufficient documentation

## 2014-01-13 DIAGNOSIS — C50219 Malignant neoplasm of upper-inner quadrant of unspecified female breast: Secondary | ICD-10-CM | POA: Diagnosis not present

## 2014-01-13 NOTE — Telephone Encounter (Signed)
Called patient to inform of mammogram for 01-20-14- arrival time - 11:00 am, spoke with patient and she is aware of this test

## 2014-01-13 NOTE — Progress Notes (Signed)
Please see the Nurse Progress Note in the MD Initial Consult Encounter for this patient. 

## 2014-01-13 NOTE — Progress Notes (Signed)
Department of Radiation Oncology  Phone:  [phone] Fax:        [phone]   Name: Meagan Wallace MRN: [id number]  DOB: 10-11-1946  Date: 01/13/2014  Follow Up Visit Note  Diagnosis: DCIS of the right breast  Interval History: Meagan Wallace presents today for routine followup.  She did well with surgery.  She was found to have multifocal DCIS. Her margins were negative with a focally close margin anteriorly. Her DCIS was ER/PR negative. She has not seen Dr. Brantley Stage yet or Dr. Humphrey Rolls.  She has appointments with both next week.   Allergies: No Known Allergies  Medications:  Current Outpatient Prescriptions  Medication Sig Dispense Refill  . albuterol (PROVENTIL) (2.5 MG/3ML) 0.083% nebulizer solution Take 2.5 mg by nebulization every 6 (six) hours as needed for wheezing or shortness of breath.      Marland Kitchen aluminum hydroxide-magnesium carbonate (GAVISCON) 95-358 MG/15ML SUSP Take by mouth.      Marland Kitchen amLODipine (NORVASC) 10 MG tablet Take 10 mg by mouth daily.      . Ascorbic Acid (VITAMIN C PO) Take by mouth.      Marland Kitchen aspirin 325 MG tablet Take 325 mg by mouth daily.      Marland Kitchen bismuth subsalicylate (PEPTO BISMOL) 262 MG/15ML suspension Take 30 mLs by mouth every 4 (four) hours as needed.      . Cholecalciferol (VITAMIN D PO) Take by mouth.      . hydrochlorothiazide (HYDRODIURIL) 25 MG tablet Take 25 mg by mouth daily.      . metFORMIN (GLUCOPHAGE) 500 MG tablet Take 500 mg by mouth 2 (two) times daily with a meal.      . Pseudoeph-Doxylamine-DM-APAP (NYQUIL MULTI-SYMPTOM PO) Take by mouth 2 (two) times daily. Takes am and pm makes her feel better      . ramipril (ALTACE) 10 MG capsule Take 10 mg by mouth 2 (two) times daily.      . sertraline (ZOLOFT) 100 MG tablet Take 100 mg by mouth daily.      Marland Kitchen oxyCODONE-acetaminophen (ROXICET) 5-325 MG per tablet Take 1 tablet by mouth every 4 (four) hours as needed.  30 tablet  0   No current facility-administered medications for this encounter.     Physical Exam:   Large pendulous breasts bilaterally. Healign periareolar scar with minimal swelling. No signs of infection. Alert and oriented x 3.   IMPRESSION: Meagan Wallace is a 68 y.o. female s/p lumpectomy for DCIS of the right breast  PLAN:  I spoke to the patient today about the role of radiation and decreasing local failures in patients who undergo lumpectomy for DCIS. We discussed the process of simulation the placement tattoos. We discussed 6 weeks of treatment as an outpatient. We discussed her close margin and I drew pictures for her explaining close versus negative versus positive margins. I think the fact that this is focal will not require reexcision and she is very unlikely to have further disease if we took her back. I do think it warrants a postoperative mammogram to ensure all calcifications are gone. I set that up at [facility]. I will plan on simulating her after she has met with Dr. Brantley Stage in Dr. Humphrey Rolls. We discussed skin redness and fatigue as possible side effects. We discussed 25 treatments as an outpatient. She may be a candidate for boost. We discussed possible asymptomatic rib heart and lung damage. She has signed informed consent and agree to proceed forward. I have scheduled her for simulation on April  14. I gave her a copy of her pathology report.    Thea Silversmith, MD

## 2014-01-13 NOTE — Addendum Note (Signed)
Encounter addended by: Arlyss Repress, RN on: 01/13/2014  4:08 PM<BR>     Documentation filed: Charges VN

## 2014-01-14 NOTE — Addendum Note (Signed)
Encounter addended by: Shemekia Patane Mintz Jemal Miskell, RN on: 01/14/2014  6:11 PM<BR>     Documentation filed: Charges VN

## 2014-01-20 ENCOUNTER — Ambulatory Visit
Admission: RE | Admit: 2014-01-20 | Discharge: 2014-01-20 | Disposition: A | Discharge status: Home / Self Care | Payer: Medicare Other | Source: Ambulatory Visit | Attending: Radiation Oncology | Admitting: Radiation Oncology

## 2014-01-20 DIAGNOSIS — R928 Other abnormal and inconclusive findings on diagnostic imaging of breast: Secondary | ICD-10-CM | POA: Diagnosis not present

## 2014-01-20 DIAGNOSIS — C50211 Malignant neoplasm of upper-inner quadrant of right female breast: Secondary | ICD-10-CM

## 2014-01-24 ENCOUNTER — Ambulatory Visit: Payer: Medicare Other | Admitting: Oncology

## 2014-01-24 ENCOUNTER — Ambulatory Visit (INDEPENDENT_AMBULATORY_CARE_PROVIDER_SITE_OTHER): Payer: Medicare Other | Admitting: Surgery

## 2014-01-24 ENCOUNTER — Telehealth: Payer: Self-pay | Admitting: Oncology

## 2014-01-24 ENCOUNTER — Encounter (INDEPENDENT_AMBULATORY_CARE_PROVIDER_SITE_OTHER): Payer: Self-pay | Admitting: Surgery

## 2014-01-24 VITALS — BP 120/88 | HR 80 | Temp 98.7°F | Resp 16 | Ht 65.5 in | Wt 261.0 lb

## 2014-01-24 DIAGNOSIS — Z9889 Other specified postprocedural states: Secondary | ICD-10-CM

## 2014-01-24 NOTE — Patient Instructions (Signed)
Return 3 months. Call with questions.

## 2014-01-24 NOTE — Progress Notes (Signed)
Pt returns 3 weeks after right breast lumpectomy for DCIS. Doing well.  Close anterior margin but negative.  Exam:  Right breast periareolar incision C/D/I   No tenderness  Path:Breast, lumpectomy, Right - DUCTAL CARCINOMA IN SITU, INTERMEDIATE GRADE, SCATTERED MICROSCOPIC FOCI. - DUCTAL CARCINOMA IN SITU IS FOCALLY LESS THAN 0.1 CM TO THE ANTERIOR MARGIN. - LOBULAR NEOPLASIA (ATYPICAL LOBULAR HYPERPLASIA). - SEE ONCOLOGY TABLE BELOW. Microscopic Comment BREAST, IN SITU CARCINOMA Specimen, including laterality: Right breast. Procedure (include lymph node sampling sentinel-non-sentinel: - see description localized lumpectomy. Grade of carcinoma: Intermediate grade. Necrosis: Not identified. Estimated tumor size: (glass slide measurement): Scattered microscopic foci less than 0.1 cm each. Treatment effect: N/A Distance to closest margin: Focally less than 0.1 cm to the anterior margin. Breast prognostic profile: [id number] Estrogen receptor: 0% Progesterone receptor: 0% Lymph nodes: None examined. TNM: pTis, pNX   A/P :  S/p right breast seed localized  Lumpectomy for DCIS Close margin but negative Radiation  To start soon  RTC 3 months

## 2014-01-24 NOTE — Telephone Encounter (Signed)
, °

## 2014-01-25 ENCOUNTER — Ambulatory Visit
Admission: RE | Admit: 2014-01-25 | Discharge: 2014-01-25 | Disposition: A | Discharge status: Home / Self Care | Payer: Medicare Other | Source: Ambulatory Visit | Attending: Radiation Oncology | Admitting: Radiation Oncology

## 2014-01-25 DIAGNOSIS — Z510 Encounter for antineoplastic radiation therapy: Secondary | ICD-10-CM | POA: Insufficient documentation

## 2014-01-25 DIAGNOSIS — C50219 Malignant neoplasm of upper-inner quadrant of unspecified female breast: Secondary | ICD-10-CM | POA: Diagnosis not present

## 2014-01-25 DIAGNOSIS — C50211 Malignant neoplasm of upper-inner quadrant of right female breast: Secondary | ICD-10-CM

## 2014-01-25 NOTE — Progress Notes (Signed)
Name: Meagan Wallace   MRN: [id number]  Date:  01/25/2014  DOB: 1945/12/29  Status:outpatient    DIAGNOSIS: Breast cancer.  CONSENT VERIFIED: yes   SET UP: Patient is setup supine   IMMOBILIZATION:  The following immobilization was used:Custom Moldable Pillow, breast board.   NARRATIVE: [name] was brought to the [facility].  Identity was confirmed.  All relevant records and images related to the planned course of therapy were reviewed.  Then, the patient was positioned in a stable reproducible clinical set-up for radiation therapy.  Wires were placed to delineate the clinical extent of breast tissue. A wire was placed on the scar as well.  CT images were obtained.  An isocenter was placed. Skin markings were placed.  The CT images were loaded into the planning software where the target and avoidance structures were contoured.  The radiation prescription was entered and confirmed. The patient was discharged in stable condition and tolerated simulation well.    TREATMENT PLANNING NOTE:  Treatment planning then occurred. I have requested : MLC's, isodose plan, basic dose calculation  I personally designed and supervised the construction of 3 medically necessary complex treatment devices for the protection of critical normal structures including the lungs and contralateral breast as well as the immobilization device which is necessary for set up certainty.

## 2014-01-25 NOTE — Progress Notes (Signed)
Radiation Oncology         [phone] ________________________________  Name: Meagan Wallace      MRN: [id number]          Date: 01/25/2014              DOB: 1945/11/15  Optical Surface Tracking Plan:  Since intensity modulated radiotherapy (IMRT) and 3D conformal radiation treatment methods are predicated on accurate and precise positioning for treatment, intrafraction motion monitoring is medically necessary to ensure accurate and safe treatment delivery.  The ability to quantify intrafraction motion without excessive ionizing radiation dose can only be performed with optical surface tracking. Accordingly, surface imaging offers the opportunity to obtain 3D measurements of patient position throughout IMRT and 3D treatments without excessive radiation exposure.  I am ordering optical surface tracking for this patient's upcoming course of radiotherapy. ________________________________ Signature   Reference:   Ursula Alert, J, et al. Surface imaging-based analysis of intrafraction motion for breast radiotherapy patients.Journal of [place], n. 6, nov. 2014. ISSN 81856314.   Available at: <http://www.jacmp.org/index.php/jacmp/article/view/4957>.

## 2014-01-27 ENCOUNTER — Telehealth: Payer: Self-pay | Admitting: Oncology

## 2014-01-27 NOTE — Telephone Encounter (Signed)
, °

## 2014-01-28 ENCOUNTER — Ambulatory Visit: Payer: Medicare Other | Admitting: Oncology

## 2014-01-31 ENCOUNTER — Telehealth: Payer: Self-pay | Admitting: Oncology

## 2014-01-31 NOTE — Telephone Encounter (Signed)
LVM ADVISING APPT 4/21 CANCELLED DUE TO MD LOA AND MOVED TO DR. Earnest Conroy 4/29 @ 10.30AM.

## 2014-02-01 ENCOUNTER — Ambulatory Visit: Payer: Medicare Other | Admitting: Oncology

## 2014-02-01 ENCOUNTER — Ambulatory Visit
Admission: RE | Admit: 2014-02-01 | Discharge: 2014-02-01 | Disposition: A | Discharge status: Home / Self Care | Payer: Medicare Other | Source: Ambulatory Visit | Attending: Radiation Oncology | Admitting: Radiation Oncology

## 2014-02-01 DIAGNOSIS — C50211 Malignant neoplasm of upper-inner quadrant of right female breast: Secondary | ICD-10-CM

## 2014-02-01 DIAGNOSIS — Z510 Encounter for antineoplastic radiation therapy: Secondary | ICD-10-CM | POA: Diagnosis not present

## 2014-02-01 DIAGNOSIS — C50219 Malignant neoplasm of upper-inner quadrant of unspecified female breast: Secondary | ICD-10-CM | POA: Diagnosis not present

## 2014-02-02 ENCOUNTER — Ambulatory Visit
Admission: RE | Admit: 2014-02-02 | Discharge: 2014-02-02 | Disposition: A | Discharge status: Home / Self Care | Payer: Medicare Other | Source: Ambulatory Visit | Attending: Radiation Oncology | Admitting: Radiation Oncology

## 2014-02-02 DIAGNOSIS — Z510 Encounter for antineoplastic radiation therapy: Secondary | ICD-10-CM | POA: Diagnosis not present

## 2014-02-02 DIAGNOSIS — C50219 Malignant neoplasm of upper-inner quadrant of unspecified female breast: Secondary | ICD-10-CM | POA: Diagnosis not present

## 2014-02-02 NOTE — Progress Notes (Signed)
  Radiation Oncology         [phone] ________________________________  Name: Meagan Wallace MRN: [id number]  Date: 02/01/2014  DOB: 1945-11-18  Simulation Verification Note  Status: outpatient  NARRATIVE: The patient was brought to the treatment unit and placed in the planned treatment position. The clinical setup was verified. Then port films were obtained and uploaded to the radiation oncology medical record software.  The treatment beams were carefully compared against the planned radiation fields. The position location and shape of the radiation fields was reviewed. The targeted volume of tissue appears appropriately covered by the radiation beams. Organs at risk appear to be excluded as planned.  Based on my personal review, I approved the simulation verification. The patient's treatment will proceed as planned.  ------------------------------------------------  Thea Silversmith, MD

## 2014-02-03 ENCOUNTER — Ambulatory Visit
Admission: RE | Admit: 2014-02-03 | Discharge: 2014-02-03 | Disposition: A | Discharge status: Home / Self Care | Payer: Medicare Other | Source: Ambulatory Visit | Attending: Radiation Oncology | Admitting: Radiation Oncology

## 2014-02-03 DIAGNOSIS — C50219 Malignant neoplasm of upper-inner quadrant of unspecified female breast: Secondary | ICD-10-CM | POA: Diagnosis not present

## 2014-02-03 DIAGNOSIS — Z510 Encounter for antineoplastic radiation therapy: Secondary | ICD-10-CM | POA: Diagnosis not present

## 2014-02-04 ENCOUNTER — Ambulatory Visit
Admission: RE | Admit: 2014-02-04 | Discharge: 2014-02-04 | Disposition: A | Discharge status: Home / Self Care | Payer: Medicare Other | Source: Ambulatory Visit | Attending: Radiation Oncology | Admitting: Radiation Oncology

## 2014-02-04 DIAGNOSIS — C50211 Malignant neoplasm of upper-inner quadrant of right female breast: Secondary | ICD-10-CM

## 2014-02-04 DIAGNOSIS — C50219 Malignant neoplasm of upper-inner quadrant of unspecified female breast: Secondary | ICD-10-CM | POA: Diagnosis not present

## 2014-02-04 DIAGNOSIS — Z510 Encounter for antineoplastic radiation therapy: Secondary | ICD-10-CM | POA: Diagnosis not present

## 2014-02-04 MED ORDER — ALRA NON-METALLIC DEODORANT (RAD-ONC)
1.0000 "application " | Freq: Once | TOPICAL | Status: AC
  Administered 2014-02-04: 1 via TOPICAL

## 2014-02-04 MED ORDER — RADIAPLEXRX EX GEL
Freq: Once | CUTANEOUS | Status: AC
  Administered 2014-02-04: 15:00:00 via TOPICAL

## 2014-02-07 ENCOUNTER — Ambulatory Visit
Admission: RE | Admit: 2014-02-07 | Discharge: 2014-02-07 | Disposition: A | Discharge status: Home / Self Care | Payer: Medicare Other | Source: Ambulatory Visit | Attending: Radiation Oncology | Admitting: Radiation Oncology

## 2014-02-07 DIAGNOSIS — Z510 Encounter for antineoplastic radiation therapy: Secondary | ICD-10-CM | POA: Diagnosis not present

## 2014-02-07 DIAGNOSIS — C50219 Malignant neoplasm of upper-inner quadrant of unspecified female breast: Secondary | ICD-10-CM | POA: Diagnosis not present

## 2014-02-08 ENCOUNTER — Ambulatory Visit
Admission: RE | Admit: 2014-02-08 | Discharge: 2014-02-08 | Disposition: A | Discharge status: Home / Self Care | Payer: Medicare Other | Source: Ambulatory Visit | Attending: Radiation Oncology | Admitting: Radiation Oncology

## 2014-02-08 ENCOUNTER — Encounter: Payer: Self-pay | Admitting: Radiation Oncology

## 2014-02-08 VITALS — BP 151/92 | HR 93 | Temp 98.7°F | Ht 65.5 in | Wt 260.1 lb

## 2014-02-08 DIAGNOSIS — C50211 Malignant neoplasm of upper-inner quadrant of right female breast: Secondary | ICD-10-CM

## 2014-02-08 DIAGNOSIS — Z510 Encounter for antineoplastic radiation therapy: Secondary | ICD-10-CM | POA: Diagnosis not present

## 2014-02-08 DIAGNOSIS — C50219 Malignant neoplasm of upper-inner quadrant of unspecified female breast: Secondary | ICD-10-CM | POA: Diagnosis not present

## 2014-02-08 NOTE — Progress Notes (Signed)
Weekly Management Note Current Dose: 10  Gy  Projected Dose: 50 Gy   Narrative:  The patient presents for routine under treatment assessment.  CBCT/MVCT images/Port film x-rays were reviewed.  The chart was checked. Doing well. No complaints. Bug bite on breast. Taking benadryl.   Physical Findings: Weight: 260 lb 1.6 oz (117.981 kg). Some redness over scar. No signs of infection.   Impression:  The patient is tolerating radiation.  Plan:  Continue treatment as planned. Continue RT and radiaplex.

## 2014-02-08 NOTE — Progress Notes (Signed)
Meagan Wallace has received 5 fractions to her right breast.  Note mild redness of her right breast, but skin is intact.  The inframmary fold is clear of any irritation.

## 2014-02-09 ENCOUNTER — Ambulatory Visit (HOSPITAL_BASED_OUTPATIENT_CLINIC_OR_DEPARTMENT_OTHER): Payer: Medicare Other | Admitting: Hematology and Oncology

## 2014-02-09 ENCOUNTER — Ambulatory Visit: Payer: Medicare Other

## 2014-02-09 ENCOUNTER — Ambulatory Visit
Admission: RE | Admit: 2014-02-09 | Discharge: 2014-02-09 | Disposition: A | Discharge status: Home / Self Care | Payer: Medicare Other | Source: Ambulatory Visit | Attending: Radiation Oncology | Admitting: Radiation Oncology

## 2014-02-09 VITALS — BP 143/87 | HR 93 | Temp 98.3°F | Resp 20 | Ht 65.5 in | Wt 258.2 lb

## 2014-02-09 DIAGNOSIS — Z171 Estrogen receptor negative status [ER-]: Secondary | ICD-10-CM | POA: Diagnosis not present

## 2014-02-09 DIAGNOSIS — C50219 Malignant neoplasm of upper-inner quadrant of unspecified female breast: Secondary | ICD-10-CM | POA: Diagnosis not present

## 2014-02-09 DIAGNOSIS — C50211 Malignant neoplasm of upper-inner quadrant of right female breast: Secondary | ICD-10-CM

## 2014-02-09 DIAGNOSIS — Z510 Encounter for antineoplastic radiation therapy: Secondary | ICD-10-CM | POA: Diagnosis not present

## 2014-02-09 DIAGNOSIS — D0510 Intraductal carcinoma in situ of unspecified breast: Secondary | ICD-10-CM

## 2014-02-09 DIAGNOSIS — D0590 Unspecified type of carcinoma in situ of unspecified breast: Secondary | ICD-10-CM

## 2014-02-10 ENCOUNTER — Ambulatory Visit
Admission: RE | Admit: 2014-02-10 | Discharge: 2014-02-10 | Disposition: A | Discharge status: Home / Self Care | Payer: Medicare Other | Source: Ambulatory Visit | Attending: Radiation Oncology | Admitting: Radiation Oncology

## 2014-02-10 DIAGNOSIS — C50219 Malignant neoplasm of upper-inner quadrant of unspecified female breast: Secondary | ICD-10-CM | POA: Diagnosis not present

## 2014-02-10 DIAGNOSIS — Z510 Encounter for antineoplastic radiation therapy: Secondary | ICD-10-CM | POA: Diagnosis not present

## 2014-02-11 ENCOUNTER — Ambulatory Visit
Admission: RE | Admit: 2014-02-11 | Discharge: 2014-02-11 | Disposition: A | Discharge status: Home / Self Care | Payer: Medicare Other | Source: Ambulatory Visit | Attending: Radiation Oncology | Admitting: Radiation Oncology

## 2014-02-11 ENCOUNTER — Telehealth: Payer: Self-pay | Admitting: Adult Health

## 2014-02-11 DIAGNOSIS — Z510 Encounter for antineoplastic radiation therapy: Secondary | ICD-10-CM | POA: Diagnosis not present

## 2014-02-11 DIAGNOSIS — C50219 Malignant neoplasm of upper-inner quadrant of unspecified female breast: Secondary | ICD-10-CM | POA: Diagnosis not present

## 2014-02-11 NOTE — Telephone Encounter (Signed)
, °

## 2014-02-14 ENCOUNTER — Ambulatory Visit
Admission: RE | Admit: 2014-02-14 | Discharge: 2014-02-14 | Disposition: A | Discharge status: Home / Self Care | Payer: Medicare Other | Source: Ambulatory Visit | Attending: Radiation Oncology | Admitting: Radiation Oncology

## 2014-02-14 ENCOUNTER — Other Ambulatory Visit: Payer: Self-pay | Admitting: *Deleted

## 2014-02-14 DIAGNOSIS — Z510 Encounter for antineoplastic radiation therapy: Secondary | ICD-10-CM | POA: Diagnosis not present

## 2014-02-14 DIAGNOSIS — C50219 Malignant neoplasm of upper-inner quadrant of unspecified female breast: Secondary | ICD-10-CM | POA: Diagnosis not present

## 2014-02-14 NOTE — Progress Notes (Signed)
Meagan Wallace [id number] 1945/11/03 68 y.o. 02/14/2014 9:16 AM  CC Dr. Erroll Luna Dr. 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  STAGE:   Breast cancer of upper-inner quadrant of right female breast   Primary site: Breast (Right)   Staging method: AJCC 7th Edition   Clinical: Stage 0 (Tis, N0, cM0)   Summary: Stage 0 (Tis, N0, cM0)  REFERRING PHYSICIAN: Dr. Marcello Moores Cornett  HISTORY OF PRESENT ILLNESS: As per previously documented Dr. Laurelyn Sickle note:   Meagan Wallace is a 68 y.o. female.  Who underwent a screening mammogram and was found to have a mass in calcifications in the right breast. The mass measured 1.4 cm.  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 There was no invasive disease found. The calcifications have not been biopsied. Her tumor  ER negative  PR negative. Her case was discussed at the multidisciplinary breast conference.her pathology and radiology were reviewed. She now presents at the multidisciplinary breast clinic for discussion of treatment options.  INTERVAL HISTORY: [name] is here for followup visit. She's tolerating radiotherapy quite well with minimal erythema in the right breast. Lumpectomy scar is in good healing stage. Denies any breast pain, fever, weight loss or decrease in appetite. Denies any constipation blood in the stool blood in the urine.  Past Medical History: Past Medical History  Diagnosis Date  . Diabetes mellitus without complication   . Hypertension   . Cataract   . Stroke   . Arthritis   . Anemia   . Heart murmur   . Cancer     breast  . PONV (postoperative nausea and vomiting)     either  . Depression   . Anxiety   . Asthma     Past Surgical History: Past Surgical History  Procedure Laterality Date  . Shoulder surgery  2011    right  . Exploratory laparotomy  1987  .  Appendectomy  1987  . Colonoscopy    . Breast lumpectomy with radioactive seed localization Right 12/27/2013    Procedure: BREAST LUMPECTOMY WITH RADIOACTIVE SEED LOCALIZATION;  Surgeon: Joyice Faster. Cornett, MD;  Location: [facility];  Service: General;  Laterality: Right;    Family History: Family History  Problem Relation Age of Onset  . Breast cancer Paternal Aunt   . Heart disease Mother   . Stroke Father     Social History History  Substance Use Topics  . Smoking status: Never Smoker   . Smokeless tobacco: Not on file  . Alcohol Use: No    Allergies: No Known Allergies  Current Medications: Current Outpatient Prescriptions  Medication Sig Dispense Refill  . amLODipine (NORVASC) 10 MG tablet Take 10 mg by mouth daily.      . Ascorbic Acid (VITAMIN C PO) Take by mouth.      Marland Kitchen aspirin 325 MG tablet Take 325 mg by mouth daily.      . Cholecalciferol (VITAMIN D PO) Take by mouth.      . diphenhydrAMINE (BENADRYL) 25 MG tablet Take 25 mg by mouth 2 (two) times daily as needed.      . hydrochlorothiazide (HYDRODIURIL) 25 MG tablet Take 25 mg by mouth daily.      . metFORMIN (GLUCOPHAGE) 500 MG tablet Take 500 mg by mouth 2 (two) times daily with a meal.      . Pseudoeph-Doxylamine-DM-APAP (NYQUIL MULTI-SYMPTOM  PO) Take by mouth 2 (two) times daily. Takes am and pm makes her feel better      . ramipril (ALTACE) 10 MG capsule Take 10 mg by mouth 2 (two) times daily.      . sertraline (ZOLOFT) 100 MG tablet Take 100 mg by mouth daily.      Marland Kitchen albuterol (PROVENTIL) (2.5 MG/3ML) 0.083% nebulizer solution Take 2.5 mg by nebulization every 6 (six) hours as needed for wheezing or shortness of breath.      Marland Kitchen aluminum hydroxide-magnesium carbonate (GAVISCON) 95-358 MG/15ML SUSP Take by mouth.       No current facility-administered medications for this visit.    OB/GYN History: menarche at age 43 1/2, menopause in 35, no HRT, OCP x 10 years, nulliparous.  Fertility  Discussion: n/a Prior History of Cancer: no   Health Maintenance:  Colonoscopy yes Bone Density yes Last PAP smear unknown  ECOG PERFORMANCE STATUS: 1 - Symptomatic but completely ambulatory  Genetic Counseling/testing: no  REVIEW OF SYSTEMS:  A comprehensive review of systems was negative.  PHYSICAL EXAMINATION: Blood pressure 143/87, pulse 93, temperature 98.3 F (36.8 C), temperature source Oral, resp. rate 20, height 5' 5.5" (1.664 m), weight 258 lb 3.2 oz (117.119 kg).  General:  well-nourished in no acute distress.   Eyes:  no scleral icterus.  ENT:  There were no oropharyngeal lesions.  Neck was without thyromegaly.   Lymphatics:  Negative cervical, supraclavicular or axillary adenopathy.   Respiratory: lungs were clear bilaterally without wheezing or crackles.   Cardiovascular:  Regular rate and rhythm, S1/S2, without murmur, rub or gallop.  There was no pedal edema.   GI:  abdomen was soft, flat, nontender, nondistended, without organomegaly.   Muscoloskeletal:  no spinal tenderness of palpation of vertebral spine.   Skin exam was without echymosis, petichae.   Neuro exam was nonfocal.  Patient was able to get on and off exam table without assistance.  Gait was normal.  Patient was alerted and oriented.  Attention was good.   Language was appropriate.  Mood was normal without depression.  Speech was not pressured.  Thought content was not tangential.   Breasts: right breast s/p lumpectomy scar noted, mild erythema in the right breast  was noted.  Left breast: no massess felt   LABS:    Chemistry      Component Value Date/Time   NA 141 12/24/2013 1400   NA 141 12/01/2013 0807   K 4.4 12/24/2013 1400   K 4.2 12/01/2013 0807   CL 98 12/24/2013 1400   CO2 29 12/24/2013 1400   CO2 27 12/01/2013 0807   BUN 12 12/24/2013 1400   BUN 11.8 12/01/2013 0807   CREATININE 0.64 12/24/2013 1400   CREATININE 0.7 12/01/2013 0807      Component Value Date/Time   CALCIUM 10.2 12/24/2013  1400   CALCIUM 10.5* 12/01/2013 0807   ALKPHOS 94 12/24/2013 1400   ALKPHOS 97 12/01/2013 0807   AST 17 12/24/2013 1400   AST 13 12/01/2013 0807   ALT 17 12/24/2013 1400   ALT 17 12/01/2013 0807   BILITOT 0.7 12/24/2013 1400   BILITOT 0.56 12/01/2013 0807      Lab Results  Component Value Date   WBC 13.2* 12/24/2013   HGB 13.2 12/24/2013   HCT 39.8 12/24/2013   MCV 81.7 12/24/2013   PLT 226 12/24/2013   PATHOLOGY  ASSESSMENT/PLAN    68 year old female with  1. 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 DCIS in the area of complex sclerosing lesion. Another area of calcifications noted in the ipsilateral breast. Not biopsied. It was recommneded that this area be biopsy to determine whether the lesions are malignant or benign. The DCIS was ER/PR -.  2. We spent the better part of today's hour-long appointment discussing the biology of breast cancer in general, and the specifics of the patient's tumor in particular. We discussed the multidisciplinary approach to breast cancer treatment. We went over her pathology. She understands that her cancer is a non invasive disease. The treatment will include surgery, radiation and possibly anti-estrogen therapy to help prevent future ER+ breast cancer in the ipsilateral and contralateral breasts. We also discussed the need for further biopsying the calcifications. If she has another focus of DCIS in the calcifications then we will need to remove those as well.  3. S/p lumpectomy  On 12/27/2013: Path revealed  DUCTAL CARCINOMA IN SITU, INTERMEDIATE GRADE, SCATTERED MICROSCOPIC FOCI. - DUCTAL CARCINOMA IN SITU IS FOCALLY LESS THAN 0.1 CM TO THE ANTERIOR MARGIN. - LOBULAR NEOPLASIA (ATYPICAL LOBULAR HYPERPLASIA).  4. Currently receiving Radiation therapy  5. Clinical Trial Eligibility: no   6. We have  requested pathology to Rpt ER and PR studies on the lumpectomy specimen performed on 12/27/2013  7. F/u in June  first week after completion of radiation therapy for possible antiesrogen therapy    All questions were answered. The patient knows to call the clinic with any problems, questions or concerns. We can certainly see the patient much sooner if necessary.  I spent 20 minutes counseling the patient face to face. The total time spent in the appointment was 65minutes.  Wilmon Arms, MD Medical/Oncology [facility] [phone] (Office)  02/14/2014, 9:16 AM

## 2014-02-15 ENCOUNTER — Ambulatory Visit
Admission: RE | Admit: 2014-02-15 | Discharge: 2014-02-15 | Disposition: A | Discharge status: Home / Self Care | Payer: Medicare Other | Source: Ambulatory Visit | Attending: Radiation Oncology | Admitting: Radiation Oncology

## 2014-02-15 VITALS — BP 129/85 | HR 100 | Temp 98.9°F | Wt 262.5 lb

## 2014-02-15 DIAGNOSIS — C50211 Malignant neoplasm of upper-inner quadrant of right female breast: Secondary | ICD-10-CM

## 2014-02-15 DIAGNOSIS — Z510 Encounter for antineoplastic radiation therapy: Secondary | ICD-10-CM | POA: Diagnosis not present

## 2014-02-15 DIAGNOSIS — C50219 Malignant neoplasm of upper-inner quadrant of unspecified female breast: Secondary | ICD-10-CM | POA: Diagnosis not present

## 2014-02-15 NOTE — Progress Notes (Signed)
Weekly assessment of radiation to right breast.No skin changes.Continue application of radiaplex twice daily.Denies pain.

## 2014-02-15 NOTE — Progress Notes (Signed)
Weekly Management Note Current Dose: 20  Gy  Projected Dose:50  Gy   Narrative:  The patient presents for routine under treatment assessment.  CBCT/MVCT images/Port film x-rays were reviewed.  The chart was checked. Doing well. No complaints.   Physical Findings: Weight: 262 lb 8 oz (119.069 kg). Unchanged  Impression:  The patient is tolerating radiation.  Plan:  Continue treatment as planned. Continue radiaplex.

## 2014-02-16 ENCOUNTER — Ambulatory Visit
Admission: RE | Admit: 2014-02-16 | Discharge: 2014-02-16 | Disposition: A | Discharge status: Home / Self Care | Payer: Medicare Other | Source: Ambulatory Visit | Attending: Radiation Oncology | Admitting: Radiation Oncology

## 2014-02-16 DIAGNOSIS — Z510 Encounter for antineoplastic radiation therapy: Secondary | ICD-10-CM | POA: Diagnosis not present

## 2014-02-16 DIAGNOSIS — C50219 Malignant neoplasm of upper-inner quadrant of unspecified female breast: Secondary | ICD-10-CM | POA: Diagnosis not present

## 2014-02-17 ENCOUNTER — Ambulatory Visit
Admission: RE | Admit: 2014-02-17 | Discharge: 2014-02-17 | Disposition: A | Discharge status: Home / Self Care | Payer: Medicare Other | Source: Ambulatory Visit | Attending: Radiation Oncology | Admitting: Radiation Oncology

## 2014-02-17 DIAGNOSIS — Z510 Encounter for antineoplastic radiation therapy: Secondary | ICD-10-CM | POA: Diagnosis not present

## 2014-02-17 DIAGNOSIS — C50219 Malignant neoplasm of upper-inner quadrant of unspecified female breast: Secondary | ICD-10-CM | POA: Diagnosis not present

## 2014-02-18 ENCOUNTER — Ambulatory Visit
Admission: RE | Admit: 2014-02-18 | Discharge: 2014-02-18 | Disposition: A | Discharge status: Home / Self Care | Payer: Medicare Other | Source: Ambulatory Visit | Attending: Radiation Oncology | Admitting: Radiation Oncology

## 2014-02-18 DIAGNOSIS — Z510 Encounter for antineoplastic radiation therapy: Secondary | ICD-10-CM | POA: Diagnosis not present

## 2014-02-18 DIAGNOSIS — C50219 Malignant neoplasm of upper-inner quadrant of unspecified female breast: Secondary | ICD-10-CM | POA: Diagnosis not present

## 2014-02-21 ENCOUNTER — Ambulatory Visit
Admission: RE | Admit: 2014-02-21 | Discharge: 2014-02-21 | Disposition: A | Discharge status: Home / Self Care | Payer: Medicare Other | Source: Ambulatory Visit | Attending: Radiation Oncology | Admitting: Radiation Oncology

## 2014-02-21 DIAGNOSIS — C50219 Malignant neoplasm of upper-inner quadrant of unspecified female breast: Secondary | ICD-10-CM | POA: Diagnosis not present

## 2014-02-21 DIAGNOSIS — Z510 Encounter for antineoplastic radiation therapy: Secondary | ICD-10-CM | POA: Diagnosis not present

## 2014-02-22 ENCOUNTER — Ambulatory Visit
Admission: RE | Admit: 2014-02-22 | Discharge: 2014-02-22 | Disposition: A | Discharge status: Home / Self Care | Payer: Medicare Other | Source: Ambulatory Visit | Attending: Radiation Oncology | Admitting: Radiation Oncology

## 2014-02-22 VITALS — BP 144/87 | HR 92 | Temp 98.5°F | Wt 262.2 lb

## 2014-02-22 DIAGNOSIS — C50219 Malignant neoplasm of upper-inner quadrant of unspecified female breast: Secondary | ICD-10-CM | POA: Diagnosis not present

## 2014-02-22 DIAGNOSIS — C50211 Malignant neoplasm of upper-inner quadrant of right female breast: Secondary | ICD-10-CM

## 2014-02-22 DIAGNOSIS — Z510 Encounter for antineoplastic radiation therapy: Secondary | ICD-10-CM | POA: Diagnosis not present

## 2014-02-22 MED ORDER — RADIAPLEXRX EX GEL
Freq: Once | CUTANEOUS | Status: AC
  Administered 2014-02-22: 16:00:00 via TOPICAL

## 2014-02-22 NOTE — Addendum Note (Signed)
Encounter addended by: Arlyss Repress, RN on: 02/22/2014  4:01 PM<BR>     Documentation filed: Inpatient MAR

## 2014-02-22 NOTE — Progress Notes (Signed)
Weekly assessment of radiation to right breast.Completed 15 of 25 treatments.Has mild discoloration with rash over breast and under right mammary fold.told to use cornstarch under breast after treatment to help with sweating.Given another tube of radiaplex.

## 2014-02-22 NOTE — Progress Notes (Signed)
Weekly Management Note Current Dose: 30  Gy  Projected Dose: 50 Gy   Narrative:  The patient presents for routine under treatment assessment.  CBCT/MVCT images/Port film x-rays were reviewed.  The chart was checked. Doing well. No complaints. Using cornstarch and radiaplex.  Physical Findings: Weight: 262 lb 3.2 oz (118.933 kg). Slightly pink breast.  Impression:  The patient is tolerating radiation.  Plan:  Continue treatment as planned. Continue rT

## 2014-02-23 ENCOUNTER — Ambulatory Visit
Admission: RE | Admit: 2014-02-23 | Discharge: 2014-02-23 | Disposition: A | Discharge status: Home / Self Care | Payer: Medicare Other | Source: Ambulatory Visit | Attending: Radiation Oncology | Admitting: Radiation Oncology

## 2014-02-23 DIAGNOSIS — C50219 Malignant neoplasm of upper-inner quadrant of unspecified female breast: Secondary | ICD-10-CM | POA: Diagnosis not present

## 2014-02-23 DIAGNOSIS — Z510 Encounter for antineoplastic radiation therapy: Secondary | ICD-10-CM | POA: Diagnosis not present

## 2014-02-24 ENCOUNTER — Ambulatory Visit: Admission: RE | Admit: 2014-02-24 | Payer: Medicare Other | Source: Ambulatory Visit

## 2014-02-25 ENCOUNTER — Ambulatory Visit
Admission: RE | Admit: 2014-02-25 | Discharge: 2014-02-25 | Disposition: A | Discharge status: Home / Self Care | Payer: Medicare Other | Source: Ambulatory Visit | Attending: Radiation Oncology | Admitting: Radiation Oncology

## 2014-02-25 DIAGNOSIS — Z510 Encounter for antineoplastic radiation therapy: Secondary | ICD-10-CM | POA: Diagnosis not present

## 2014-02-25 DIAGNOSIS — C50219 Malignant neoplasm of upper-inner quadrant of unspecified female breast: Secondary | ICD-10-CM | POA: Diagnosis not present

## 2014-02-28 ENCOUNTER — Ambulatory Visit
Admission: RE | Admit: 2014-02-28 | Discharge: 2014-02-28 | Disposition: A | Discharge status: Home / Self Care | Payer: Medicare Other | Source: Ambulatory Visit | Attending: Radiation Oncology | Admitting: Radiation Oncology

## 2014-02-28 DIAGNOSIS — Z510 Encounter for antineoplastic radiation therapy: Secondary | ICD-10-CM | POA: Diagnosis not present

## 2014-02-28 DIAGNOSIS — C50219 Malignant neoplasm of upper-inner quadrant of unspecified female breast: Secondary | ICD-10-CM | POA: Diagnosis not present

## 2014-03-01 ENCOUNTER — Ambulatory Visit
Admission: RE | Admit: 2014-03-01 | Discharge: 2014-03-01 | Disposition: A | Discharge status: Home / Self Care | Payer: Medicare Other | Source: Ambulatory Visit | Attending: Radiation Oncology | Admitting: Radiation Oncology

## 2014-03-01 VITALS — BP 124/88 | HR 93 | Temp 98.3°F | Wt 252.5 lb

## 2014-03-01 DIAGNOSIS — C50211 Malignant neoplasm of upper-inner quadrant of right female breast: Secondary | ICD-10-CM

## 2014-03-01 DIAGNOSIS — Z510 Encounter for antineoplastic radiation therapy: Secondary | ICD-10-CM | POA: Diagnosis not present

## 2014-03-01 DIAGNOSIS — C50219 Malignant neoplasm of upper-inner quadrant of unspecified female breast: Secondary | ICD-10-CM | POA: Diagnosis not present

## 2014-03-01 NOTE — Progress Notes (Signed)
Weekly Management Note Current Dose: 38  Gy  Projected Dose: 50 Gy   Narrative:  The patient presents for routine under treatment assessment.  CBCT/MVCT images/Port film x-rays were reviewed.  The chart was checked. Nausea continues.  Not taking anything. Would like to use deodorant. Continues care for husband. Had questions about "markers" discussed at appt with Dr. Andria Frames.   Physical Findings: Weight: 252 lb 8 oz (114.533 kg). Bright pink right breast  Impression:  The patient is tolerating radiation.  Plan:  Continue treatment as planned. D/c cornstarch. Start neosporin. Ok to use doe. Wipe off before tx. Will investigate ER/PR further.

## 2014-03-01 NOTE — Progress Notes (Signed)
Weekly assessment of radiation to right breast.Has hyperpigmentation and peeling of right mammary fold.Continue radiaplex  and cornstarch.Patient affect different states she has been having increased nausea."I feel like I stink, I took a bath but just feel like I smell myself."Completed 19 of 25 treatments.Printed off appointment calendar.

## 2014-03-02 ENCOUNTER — Ambulatory Visit
Admission: RE | Admit: 2014-03-02 | Discharge: 2014-03-02 | Disposition: A | Discharge status: Home / Self Care | Payer: Medicare Other | Source: Ambulatory Visit | Attending: Radiation Oncology | Admitting: Radiation Oncology

## 2014-03-02 DIAGNOSIS — C50219 Malignant neoplasm of upper-inner quadrant of unspecified female breast: Secondary | ICD-10-CM | POA: Diagnosis not present

## 2014-03-02 DIAGNOSIS — Z510 Encounter for antineoplastic radiation therapy: Secondary | ICD-10-CM | POA: Diagnosis not present

## 2014-03-03 ENCOUNTER — Ambulatory Visit
Admission: RE | Admit: 2014-03-03 | Discharge: 2014-03-03 | Disposition: A | Discharge status: Home / Self Care | Payer: Medicare Other | Source: Ambulatory Visit | Attending: Radiation Oncology | Admitting: Radiation Oncology

## 2014-03-03 DIAGNOSIS — Z510 Encounter for antineoplastic radiation therapy: Secondary | ICD-10-CM | POA: Diagnosis not present

## 2014-03-03 DIAGNOSIS — C50219 Malignant neoplasm of upper-inner quadrant of unspecified female breast: Secondary | ICD-10-CM | POA: Diagnosis not present

## 2014-03-04 ENCOUNTER — Ambulatory Visit
Admission: RE | Admit: 2014-03-04 | Discharge: 2014-03-04 | Disposition: A | Discharge status: Home / Self Care | Payer: Medicare Other | Source: Ambulatory Visit | Attending: Radiation Oncology | Admitting: Radiation Oncology

## 2014-03-04 DIAGNOSIS — C50219 Malignant neoplasm of upper-inner quadrant of unspecified female breast: Secondary | ICD-10-CM | POA: Diagnosis not present

## 2014-03-04 DIAGNOSIS — Z510 Encounter for antineoplastic radiation therapy: Secondary | ICD-10-CM | POA: Diagnosis not present

## 2014-03-08 ENCOUNTER — Ambulatory Visit: Payer: Medicare Other | Admitting: Radiation Oncology

## 2014-03-08 ENCOUNTER — Ambulatory Visit
Admission: RE | Admit: 2014-03-08 | Discharge: 2014-03-08 | Disposition: A | Discharge status: Home / Self Care | Payer: Medicare Other | Source: Ambulatory Visit | Attending: Radiation Oncology | Admitting: Radiation Oncology

## 2014-03-08 VITALS — BP 148/87 | HR 93 | Temp 98.5°F | Wt 262.3 lb

## 2014-03-08 DIAGNOSIS — Z510 Encounter for antineoplastic radiation therapy: Secondary | ICD-10-CM | POA: Diagnosis not present

## 2014-03-08 DIAGNOSIS — C50211 Malignant neoplasm of upper-inner quadrant of right female breast: Secondary | ICD-10-CM

## 2014-03-08 DIAGNOSIS — C50219 Malignant neoplasm of upper-inner quadrant of unspecified female breast: Secondary | ICD-10-CM | POA: Diagnosis not present

## 2014-03-08 NOTE — Progress Notes (Signed)
Weekly assessment of right breast .completed 23 of 25 treatments.Has new onset of peeling of right mammary fold.Patient has been applying neosporin.Gave printed information on "markers" last week.Patient had no questions today.Affect is "sad". Asked if there were any issues she would like to discuss and told her to let me know if there is something we could helo her with.She denied any problems.

## 2014-03-08 NOTE — Progress Notes (Signed)
Weekly Management Note Current Dose:  46 Gy  Projected Dose: 60 Gy   Narrative:  The patient presents for routine under treatment assessment.  CBCT/MVCT images/Port film x-rays were reviewed.  The chart was checked. Breakdown and irritation under breast and in axilla. Appreciated information I left for her about receptors.  Physical Findings: Weight: 262 lb 4.8 oz (118.978 kg). Moist desquamation in inframammary fold and axilla. Rest of breast is pink.   Impression:  The patient is tolerating radiation.  Plan:  Continue treatment as planned. Neosporin to inframammary fold and axilla. Continue radiaplex to other areas. Starting Boost Friday.

## 2014-03-09 ENCOUNTER — Ambulatory Visit
Admission: RE | Admit: 2014-03-09 | Discharge: 2014-03-09 | Disposition: A | Discharge status: Home / Self Care | Payer: Medicare Other | Source: Ambulatory Visit | Attending: Radiation Oncology | Admitting: Radiation Oncology

## 2014-03-09 DIAGNOSIS — Z510 Encounter for antineoplastic radiation therapy: Secondary | ICD-10-CM | POA: Diagnosis not present

## 2014-03-09 DIAGNOSIS — C50219 Malignant neoplasm of upper-inner quadrant of unspecified female breast: Secondary | ICD-10-CM | POA: Diagnosis not present

## 2014-03-10 ENCOUNTER — Ambulatory Visit
Admission: RE | Admit: 2014-03-10 | Discharge: 2014-03-10 | Disposition: A | Discharge status: Home / Self Care | Payer: Medicare Other | Source: Ambulatory Visit | Attending: Radiation Oncology | Admitting: Radiation Oncology

## 2014-03-10 ENCOUNTER — Ambulatory Visit: Payer: Medicare Other

## 2014-03-10 DIAGNOSIS — Z510 Encounter for antineoplastic radiation therapy: Secondary | ICD-10-CM | POA: Diagnosis not present

## 2014-03-10 DIAGNOSIS — C50219 Malignant neoplasm of upper-inner quadrant of unspecified female breast: Secondary | ICD-10-CM | POA: Diagnosis not present

## 2014-03-11 ENCOUNTER — Ambulatory Visit
Admission: RE | Admit: 2014-03-11 | Discharge: 2014-03-11 | Disposition: A | Discharge status: Home / Self Care | Payer: Medicare Other | Source: Ambulatory Visit | Attending: Radiation Oncology | Admitting: Radiation Oncology

## 2014-03-11 DIAGNOSIS — C50211 Malignant neoplasm of upper-inner quadrant of right female breast: Secondary | ICD-10-CM

## 2014-03-11 DIAGNOSIS — Z510 Encounter for antineoplastic radiation therapy: Secondary | ICD-10-CM | POA: Diagnosis not present

## 2014-03-11 DIAGNOSIS — C50219 Malignant neoplasm of upper-inner quadrant of unspecified female breast: Secondary | ICD-10-CM | POA: Diagnosis not present

## 2014-03-11 NOTE — Progress Notes (Signed)
  Radiation Oncology         [phone] ________________________________  Name: Meagan Wallace MRN: [id number]  Date: 03/11/2014  DOB: Jan 26, 1946  Simulation Verification Note (breast boost)  Status: outpatient  NARRATIVE: The patient was brought to the treatment unit and placed in the planned treatment position. The clinical setup was verified. Then port films were obtained and uploaded to the radiation oncology medical record software.  The treatment beams were carefully compared against the planned radiation fields. The position location and shape of the radiation fields was reviewed. The targeted volume of tissue appears appropriately covered by the radiation beams. Organs at risk appear to be excluded as planned.  Based on my personal review, I approved the simulation verification. The patient's treatment will proceed as planned.  ------------------------------------------------  Thea Silversmith, MD

## 2014-03-14 ENCOUNTER — Ambulatory Visit
Admission: RE | Admit: 2014-03-14 | Discharge: 2014-03-14 | Disposition: A | Discharge status: Home / Self Care | Payer: Medicare Other | Source: Ambulatory Visit | Attending: Radiation Oncology | Admitting: Radiation Oncology

## 2014-03-14 DIAGNOSIS — C50219 Malignant neoplasm of upper-inner quadrant of unspecified female breast: Secondary | ICD-10-CM | POA: Diagnosis not present

## 2014-03-14 DIAGNOSIS — Z510 Encounter for antineoplastic radiation therapy: Secondary | ICD-10-CM | POA: Diagnosis not present

## 2014-03-15 ENCOUNTER — Other Ambulatory Visit: Payer: Self-pay | Admitting: *Deleted

## 2014-03-15 ENCOUNTER — Ambulatory Visit
Admission: RE | Admit: 2014-03-15 | Discharge: 2014-03-15 | Disposition: A | Discharge status: Home / Self Care | Payer: Medicare Other | Source: Ambulatory Visit | Attending: Radiation Oncology | Admitting: Radiation Oncology

## 2014-03-15 VITALS — BP 128/73 | HR 94 | Temp 98.7°F | Wt 262.7 lb

## 2014-03-15 DIAGNOSIS — Z510 Encounter for antineoplastic radiation therapy: Secondary | ICD-10-CM | POA: Diagnosis not present

## 2014-03-15 DIAGNOSIS — C50211 Malignant neoplasm of upper-inner quadrant of right female breast: Secondary | ICD-10-CM

## 2014-03-15 DIAGNOSIS — C50219 Malignant neoplasm of upper-inner quadrant of unspecified female breast: Secondary | ICD-10-CM | POA: Diagnosis not present

## 2014-03-15 MED ORDER — RADIAPLEXRX EX GEL
Freq: Once | CUTANEOUS | Status: AC
  Administered 2014-03-15: 1 via TOPICAL

## 2014-03-15 NOTE — Progress Notes (Signed)
  Radiation Oncology         [phone] ________________________________  Name: Meagan Wallace MRN: [id number]  Date: 03/15/2014  DOB: 1946-05-30  Weekly Radiation Therapy Management  Current Dose: 56 Gy     Planned Dose:  60 Gy  Narrative . . . . . . . . The patient presents for routine under treatment assessment.                                   The patient is uncomfortable with her moist desquamation. She also has some fatigue.  No history of chills or fever                                 Set-up films were reviewed.                                 The chart was checked. Physical Findings. . .  weight is 262 lb 11.2 oz (119.16 kg). Her temperature is 98.7 F (37.1 C). Her blood pressure is 128/73 and her pulse is 94. . Moist desquamation is noted throughout much of the treatment area in particular the inframammary fold area. Impression . . . . . . . The patient is tolerating radiation. Plan . . . . . . . . . . . . Continue treatment as planned.  The patient will complete her treatment later this week. She will see Dr. Pablo Ledger next week for close followup in light of her significant moist desquamation.  ________________________________   Blair Promise, PhD, MD

## 2014-03-15 NOTE — Progress Notes (Signed)
Weekly assessment of radiation to right breast.Patient has 2 more treatments of 30 remaining.Skin care reviewed.will give another tube of radiaplex.Visible wet desquamation of right mammary fold.currently applying neosporin to this area.Moderate pain and fatigue.Affect improved today.Given card to schedule one month follow up with Dr.Wentworth.

## 2014-03-16 ENCOUNTER — Other Ambulatory Visit: Payer: Medicare Other

## 2014-03-16 ENCOUNTER — Ambulatory Visit: Payer: Medicare Other

## 2014-03-16 ENCOUNTER — Encounter: Payer: Self-pay | Admitting: Oncology

## 2014-03-16 ENCOUNTER — Telehealth: Payer: Self-pay | Admitting: Oncology

## 2014-03-16 ENCOUNTER — Ambulatory Visit: Payer: Medicare Other | Admitting: Adult Health

## 2014-03-16 ENCOUNTER — Telehealth: Payer: Self-pay | Admitting: Adult Health

## 2014-03-16 ENCOUNTER — Other Ambulatory Visit (HOSPITAL_BASED_OUTPATIENT_CLINIC_OR_DEPARTMENT_OTHER): Payer: Medicare Other

## 2014-03-16 ENCOUNTER — Ambulatory Visit
Admission: RE | Admit: 2014-03-16 | Discharge: 2014-03-16 | Disposition: A | Discharge status: Home / Self Care | Payer: Medicare Other | Source: Ambulatory Visit | Attending: Radiation Oncology | Admitting: Radiation Oncology

## 2014-03-16 ENCOUNTER — Ambulatory Visit (HOSPITAL_BASED_OUTPATIENT_CLINIC_OR_DEPARTMENT_OTHER): Payer: Medicare Other | Admitting: Oncology

## 2014-03-16 VITALS — BP 141/84 | HR 92 | Temp 98.3°F | Resp 18 | Ht 65.0 in | Wt 260.5 lb

## 2014-03-16 DIAGNOSIS — D0511 Intraductal carcinoma in situ of right breast: Secondary | ICD-10-CM

## 2014-03-16 DIAGNOSIS — D0590 Unspecified type of carcinoma in situ of unspecified breast: Secondary | ICD-10-CM

## 2014-03-16 DIAGNOSIS — Z171 Estrogen receptor negative status [ER-]: Secondary | ICD-10-CM

## 2014-03-16 DIAGNOSIS — I639 Cerebral infarction, unspecified: Secondary | ICD-10-CM

## 2014-03-16 DIAGNOSIS — M8580 Other specified disorders of bone density and structure, unspecified site: Secondary | ICD-10-CM

## 2014-03-16 DIAGNOSIS — C50219 Malignant neoplasm of upper-inner quadrant of unspecified female breast: Secondary | ICD-10-CM | POA: Diagnosis not present

## 2014-03-16 DIAGNOSIS — Z6841 Body Mass Index (BMI) 40.0 and over, adult: Secondary | ICD-10-CM

## 2014-03-16 DIAGNOSIS — Z510 Encounter for antineoplastic radiation therapy: Secondary | ICD-10-CM | POA: Diagnosis not present

## 2014-03-16 DIAGNOSIS — I10 Essential (primary) hypertension: Secondary | ICD-10-CM | POA: Diagnosis not present

## 2014-03-16 DIAGNOSIS — Z8673 Personal history of transient ischemic attack (TIA), and cerebral infarction without residual deficits: Secondary | ICD-10-CM | POA: Diagnosis not present

## 2014-03-16 DIAGNOSIS — E669 Obesity, unspecified: Secondary | ICD-10-CM

## 2014-03-16 DIAGNOSIS — E119 Type 2 diabetes mellitus without complications: Secondary | ICD-10-CM | POA: Diagnosis not present

## 2014-03-16 DIAGNOSIS — C50211 Malignant neoplasm of upper-inner quadrant of right female breast: Secondary | ICD-10-CM

## 2014-03-16 LAB — COMPREHENSIVE METABOLIC PANEL (CC13)
ALBUMIN: 3.4 g/dL — AB (ref 3.5–5.0)
ALT: 16 U/L (ref 0–55)
ANION GAP: 15 meq/L — AB (ref 3–11)
AST: 14 U/L (ref 5–34)
Alkaline Phosphatase: 85 U/L (ref 40–150)
BUN: 13.9 mg/dL (ref 7.0–26.0)
CALCIUM: 9.6 mg/dL (ref 8.4–10.4)
CHLORIDE: 101 meq/L (ref 98–109)
CO2: 25 mEq/L (ref 22–29)
CREATININE: 0.7 mg/dL (ref 0.6–1.1)
GLUCOSE: 105 mg/dL (ref 70–140)
POTASSIUM: 4.3 meq/L (ref 3.5–5.1)
Sodium: 141 mEq/L (ref 136–145)
Total Bilirubin: 0.64 mg/dL (ref 0.20–1.20)
Total Protein: 7.2 g/dL (ref 6.4–8.3)

## 2014-03-16 LAB — CBC WITH DIFFERENTIAL/PLATELET
BASO%: 0.6 % (ref 0.0–2.0)
Basophils Absolute: 0.1 10*3/uL (ref 0.0–0.1)
EOS ABS: 0.1 10*3/uL (ref 0.0–0.5)
EOS%: 0.9 % (ref 0.0–7.0)
HEMATOCRIT: 40.1 % (ref 34.8–46.6)
HEMOGLOBIN: 13.1 g/dL (ref 11.6–15.9)
LYMPH#: 0.7 10*3/uL — AB (ref 0.9–3.3)
LYMPH%: 7.8 % — ABNORMAL LOW (ref 14.0–49.7)
MCH: 26 pg (ref 25.1–34.0)
MCHC: 32.8 g/dL (ref 31.5–36.0)
MCV: 79.4 fL — ABNORMAL LOW (ref 79.5–101.0)
MONO#: 0.9 10*3/uL (ref 0.1–0.9)
MONO%: 9.7 % (ref 0.0–14.0)
NEUT%: 81 % — ABNORMAL HIGH (ref 38.4–76.8)
NEUTROS ABS: 7.1 10*3/uL — AB (ref 1.5–6.5)
Platelets: 178 10*3/uL (ref 145–400)
RBC: 5.05 10*6/uL (ref 3.70–5.45)
RDW: 15.1 % — AB (ref 11.2–14.5)
WBC: 8.8 10*3/uL (ref 3.9–10.3)

## 2014-03-16 NOTE — Telephone Encounter (Signed)
per LC to chge to [facility]-pt onthe way her for rad-will send note to [facility] o have pt do labs after rad @ 10:30

## 2014-03-16 NOTE — Telephone Encounter (Signed)
per pof to sch appt-sch and gave pt copy of sch °

## 2014-03-16 NOTE — Progress Notes (Signed)
[place]  [phone] [phone]   Medical Oncology Date of visit 03-16-2014  ID: Meagan Wallace OB: [birth year]  MR#: [id number]  [id number]  PCP: Charlynn Court, NP (Dr Reinaldo Meeker, [place] and [place]) GYN:  Remotely saw Dr Loni Muse. Ross, last PAP "years ago" SU: [employer] OTHER [id number] Dian Situ (cardiology, [place])  Patient is seen, alone for visit and for the first time by this MD, previously seen at this office by Dr Marcy Panning on 12-05-13.  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 She had stereotactic biopsy 11-11-2013 at [facility] of 1.9 cm mass at 1200, path ([id number]) DCIS with calcifications, high grade, reportedly ER PR negative. She had bilateral MRI breast in [facility] system 12-02-13 with 2.6 x 2.1 x 1.9 cm area at 1200 and other scattered areas of enhancement in breasts bilaterally. She had MRI guided biopsy of right lower outer quadrant breast with benign fibroadenoma with calcifications, no atypia or malignancy ([id number]). She had right breast radioactive seed localization lumpectomy by Dr Brantley Stage on 12-27-13, confirmed by radiograph of the specimen. Pathology [phone]) from the lumpectomy had scattered microscopic foci of intermediate grade DCIS focally < 1 mm to anterior margin, ER strongly positive at 90%, PR negative, and atypical lobular hyperplasia also noted. Pathologist reviewed initial core biopsy, which was of higher grade and ER PR negative. Radiation oncology and surgery did not recommend further excision. Patient began RT in midApril 2015, which is to complete tomorrow.  Note that at the time of her initial consultation with Dr Humphrey Rolls, the DCIS appeared  ;hormone receptor negative.  She has not had hysterectomy and is overdue gyn exam. She had bone density scan at [facility] in ~ Dec 2014 or Jan 2015; I will request that report. Patient recalls that she was told to begin Ca and D for osteopenia. 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 She later had brain scans in [place] which she understood showed remote CVA. No information here about lipids. She has had some hot flashes x years.  INTERVAL HISTORY: Patient has significant moist desquamation of right breast which is uncomfortable but tolerable. She has had some fatigue, no increased SOB or other respiratory symptoms. She has had some nausea past few weeks with occasional vomiting, no GERD. She is not aware of any changes in left breast.  REVIEW OF SYSTEMS: as above, also: no new or different neurologic symptoms. Zoloft helpful for hot flashes. Left knee pain, previously much better for long duration after steroid injection ~ 10 years ago. No change in bowels or noted blood in stools. No other bleeding. No LE swelling. Does not check blood sugars at home. Remainder of 10 point review of systems not remarkable.  PAST MEDICAL HISTORY: Past Medical History  Diagnosis Date  . Diabetes mellitus without complication   . Hypertension   . Cataract   . Stroke   . Arthritis   . Anemia   . Heart murmur   . Cancer     breast  . PONV (postoperative nausea and vomiting)  either  . Depression   . Anxiety   . Asthma   Diabetes on oral agent "for a few years", does not check blood sugars CVA age 68 as in history above Post cataract extractions Last PAP by Dr A.Ross years ago  PAST SURGICAL HISTORY: Past Surgical History  Procedure Laterality Date  . Shoulder surgery  2011    right  . Exploratory laparotomy  1987    . Appendectomy  1987  . Colonoscopy    . Breast lumpectomy with radioactive seed localization Right 12/27/2013    Procedure: BREAST LUMPECTOMY WITH RADIOACTIVE SEED LOCALIZATION;  Surgeon: Joyice Faster. Cornett, MD;  Location: [place];  Service: General;  Laterality: Right;  Colonoscopy x 2 by [place] "I am not having this again"  FAMILY HISTORY Family History  Problem Relation Age of Onset  . Breast cancer Paternal Aunt   . Heart disease Mother   . Stroke Father   Paternal aunt with breast cancer age 68, lived to be 32.  GYNECOLOGIC HISTORY: overdue gyn exam, does not have gynecologist  SOCIAL HISTORY: lives with husband in [place]:    HEALTH MAINTENANCE: History  Substance Use Topics  . Smoking status: Never Smoker   . Smokeless tobacco: Not on file  . Alcohol Use: No     Colonoscopy:  PAP:  Bone density:  Lipid panel:  No Known Allergies  Current Outpatient Prescriptions  Medication Sig Dispense Refill  . aluminum hydroxide-magnesium carbonate (GAVISCON) 95-358 MG/15ML SUSP Take by mouth.      Marland Kitchen amLODipine (NORVASC) 10 MG tablet Take 10 mg by mouth daily.      . Ascorbic Acid (VITAMIN C PO) Take by mouth.      Marland Kitchen aspirin 325 MG tablet Take 325 mg by mouth daily.      . Cholecalciferol (VITAMIN D PO) Take by mouth.      . hydrochlorothiazide (HYDRODIURIL) 25 MG tablet Take 25 mg by mouth daily.      . metFORMIN (GLUCOPHAGE) 500 MG tablet Take 500 mg by mouth 2 (two) times daily with a meal.      . ramipril (ALTACE) 10 MG capsule Take 10 mg by mouth 2 (two) times daily.      . sertraline (ZOLOFT) 100 MG tablet Take 100 mg by mouth daily.      Marland Kitchen albuterol (PROVENTIL) (2.5 MG/3ML) 0.083% nebulizer solution Take 2.5 mg by nebulization every 6 (six) hours as needed for wheezing or shortness of breath.      . diphenhydrAMINE (BENADRYL) 25 MG tablet Take 25 mg by mouth 2 (two) times daily as needed.      . Pseudoeph-Doxylamine-DM-APAP  (NYQUIL MULTI-SYMPTOM PO) Take by mouth 2 (two) times daily. Takes am and pm makes her feel better       No current facility-administered medications for this visit.    OBJECTIVE: Filed Vitals:   03/16/14 1035  BP: 141/84  Pulse: 92  Temp: 98.3 F (36.8 C)  Resp: 18     Body mass index is 43.35 kg/(m^2).    ECOG PS 1 Weight 260 lb 8 oz, up 3 lbs.  Easily ambulatory, NAD. Good historian. Ocular: Sclerae not icteric, pupils equal, round and reactive to light Ear-nose-throat: Oropharynx clear, dentition fair Lymphatic: No cervical or supraclavicular adenopathy, no axillary or inguinal adenopathy Lungs clear to A & P, no rales or rhonchi, good excursion bilaterally Heart regular rate and rhythm, no murmur appreciated, no gallop Abd obese,  soft, nontender, positive bowel sounds, no appreciable HSM or mass MSK no focal spinal tenderness, no joint edema. Extremities with edema, cords, tenderness Neuro/ Psych: alert, non-focal, well-oriented, appropriate affect Breasts: Right with moist desquamation particularly inferiorly and also extending to upper outer quadrant. Cannot appreciate any dominant mass. Lumpectomy incision closed. Left breast without dominant mass, skin or nipple findings of concern. Cannot examine right axillae entirely due to skin discomfort, left axilla benign Skin otherwise without rash, ecchymosis, petechiae  LAB RESULTS:  CMP     Component Value Date/Time   NA 141 03/16/2014 1017   NA 141 12/24/2013 1400   K 4.3 03/16/2014 1017   K 4.4 12/24/2013 1400   CL 98 12/24/2013 1400   CO2 25 03/16/2014 1017   CO2 29 12/24/2013 1400   GLUCOSE 105 03/16/2014 1017   GLUCOSE 101* 12/24/2013 1400   BUN 13.9 03/16/2014 1017   BUN 12 12/24/2013 1400   CREATININE 0.7 03/16/2014 1017   CREATININE 0.64 12/24/2013 1400   CALCIUM 9.6 03/16/2014 1017   CALCIUM 10.2 12/24/2013 1400   PROT 7.2 03/16/2014 1017   PROT 7.4 12/24/2013 1400   ALBUMIN 3.4* 03/16/2014 1017   ALBUMIN 3.6 12/24/2013 1400   AST  14 03/16/2014 1017   AST 17 12/24/2013 1400   ALT 16 03/16/2014 1017   ALT 17 12/24/2013 1400   ALKPHOS 85 03/16/2014 1017   ALKPHOS 94 12/24/2013 1400   BILITOT 0.64 03/16/2014 1017   BILITOT 0.7 12/24/2013 1400   GFRNONAA 89* 12/24/2013 1400   GFRAA >90 12/24/2013 1400    I   Lab Results  Component Value Date   WBC 8.8 03/16/2014   NEUTROABS 7.1* 03/16/2014   HGB 13.1 03/16/2014   HCT 40.1 03/16/2014   MCV 79.4* 03/16/2014   PLT 178 03/16/2014      Chemistry      Component Value Date/Time   NA 141 03/16/2014 1017   NA 141 12/24/2013 1400   K 4.3 03/16/2014 1017   K 4.4 12/24/2013 1400   CL 98 12/24/2013 1400   CO2 25 03/16/2014 1017   CO2 29 12/24/2013 1400   BUN 13.9 03/16/2014 1017   BUN 12 12/24/2013 1400   CREATININE 0.7 03/16/2014 1017   CREATININE 0.64 12/24/2013 1400      Component Value Date/Time   CALCIUM 9.6 03/16/2014 1017   CALCIUM 10.2 12/24/2013 1400   ALKPHOS 85 03/16/2014 1017   ALKPHOS 94 12/24/2013 1400   AST 14 03/16/2014 1017   AST 17 12/24/2013 1400   ALT 16 03/16/2014 1017   ALT 17 12/24/2013 1400   BILITOT 0.64 03/16/2014 1017   BILITOT 0.7 12/24/2013 1400        STUDIES:  Pathology Accession: [id number] Received: 12/27/2013 Erroll Luna, MD  ADDITIONAL INFORMATION: PROGNOSTIC INDICATORS - ACIS Results: IMMUNOHISTOCHEMICAL AND MORPHOMETRIC ANALYSIS BY THE AUTOMATED CELLULAR IMAGING SYSTEM (ACIS) Estrogen Receptor: 90%, POSITIVE, STRONG STAINING INTENSITY (MANUALLY) Progesterone Receptor: 0%, NEGATIVE (MANUALLY) COMMENT: The negative hormone receptor study(ies) in this case have an internal positive control. IT IS NOTED THAT THE DCIS PRESENT IN THE PATIENT'S INITIAL CORE BIOPSY IS NEGATIVE FOR ESTROGEN RECEPTOR. THIS IS CONFIRMED UPON RE-REVIEW. THE DCIS PRESENT IN THIS CURRENT EXCISION SPECIMEN IS FOCAL AND IS A LOWER NUCLEAR GRADE. THIS MAY ACCOUNT FOR THE POSITIVE ESTROGEN RECEPTOR STAINING IN THE CURRENT CASE. REFERENCE RANGE ESTROGEN RECEPTOR NEGATIVE <1% POSITIVE  =>1% PROGESTERONE RECEPTOR NEGATIVE <1% POSITIVE =>1% All controls stained appropriatelyII Enid Cutter MD  - DUCTAL CARCINOMA IN  SITU, INTERMEDIATE GRADE, SCATTERED MICROSCOPIC FOCI. - DUCTAL CARCINOMA IN SITU IS FOCALLY LESS THAN 0.1 CM TO THE ANTERIOR MARGIN. - LOBULAR NEOPLASIA (ATYPICAL LOBULAR HYPERPLASIA). - SEE ONCOLOGY TABLE BELOW. Microscopic Comment BREAST, IN SITU CARCINOMA Specimen, including laterality: Right breast. Procedure (include lymph node sampling sentinel-non-sentinel: - see description localized lumpectomy. Grade of carcinoma: Intermediate grade. Necrosis: Not identified. Estimated tumor size: (glass slide measurement): Scattered microscopic foci less than 0.1 cm each. Treatment effect: N/A Distance to closest margin: Focally less than 0.1 cm to the anterior margin. Breast prognostic profile: [phone] Estrogen receptor: 0% Progesterone receptor: 0% Lymph nodes: None examined. TNM: pTis, pNX Comments: The grossly identified lesion is a 2.8 cm radial scar harboring atypical lobular hyperplasia as well as usual ductal hyperplasia. In addition to this lesion, there are at least two microscopic foci of intermediate grade ductal carcinoma in situ, one of which is focally less than 0.1 cm from the anterior margin. Because of the microscopic size of these foci, estrogen receptor and progesterone receptor studies will likely prove fruitless and therefore will not be performed. (JBK:gt, 12/28/13)    DISCUSSION:  We have discussed all of history above and interventions to date of surgery and radiation. She understands the difference between DCIS and invasive breast cancer, and rationales for treatment done thus far. We have discussed option of using oral hormone blocker to try to change microenvironment to prevent another different breast cancer from developing, this now an option as the surgical path did show ER + DCIS that was not found in the first needle biopsy  path. We have discussed stimulation of cancer cells by estrogen, production of estrogen in ovaries in premenopausal patients, and in fat tissue and adrenal glands still in her case. We have discussed mechanism of action of tamoxifen vs aromatase inhibitors and differences in potential side effects between these 2 classes of blockers. She understands that I need the bone density scan information and that I will review brain scan information; she may need to reestablish with gyn depending on choice of agent. She also understands that, if she begins hormonal blockade and does not tolerate this, it can be stopped; if she does tolerate, it would be used for 5 years.  Patient was comfortable with discussion and in agreement with plan for me to see her back in several weeks to make decision re additional systemic treatment, after she has recovered from the radiation and after the other information has been reviewed.  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 Radiation therapy to complete on 03-17-14. Systemic hormonal blockade in preventative attempt to be considered, but has not been started today due to need for report of bone density scan and history of CVA and other issues. She will see Dr Pablo Ledger in follow up of the radiation skin reaction, and I will see her after reviewing other information for full discussion of final recommendations then. At present, gIven CVA concern, no hysterectomy/ no gyn exams and on zoloft for hot flashes, tamoxifen does not seem best choice as long as bone density scan is reasonable to use exemestane instead.  2.history of possible CVA vs rheumatic fever age 36 3.Diabetes on oral agent: not clear how well she is controlled, tho blood sugar today in good range. She does not monitor at home. 4.overdue gyn exam, no hysterectomy 5.obesity with  BMI 43.4, likely causing the diabetes. 6.HTN, ?  Other cardiac concerns, followed by cardiologist in [place] yearly. May have had lipids checked there 7.on zoloft for hot flashes. 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 7.patient reports osteopenia by recent bone scan   Have requested HIM get bone density scan [facility] and get most recent notes from PCP and from cardiology.    MRI brain from 2002 located in this EMR after visit  MRI BRAIN WITHOUT AND WITH CONTRAST PERFORMED IN THE OPEN MAGNET:  NO COMPARISON.  THERE IS MILD ATROPHY. 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 THE  WHITE MATTER IS INTACT. NO MASSES ARE SEEN. THE ENHANCEMENT PATTERN IS NORMAL. THERE IS MILD  HYPERINTENSITY IN THE RIGHT TEMPORAL LOBE ON THE T1 SEQUENCE, IMAGE #[phone]. THIS IS FELT TO BE A  PHASE ARTIFACT FROM THE INTERNAL CAROTID. THERE IS A 5 MM CYST IN THE LEFT POSTERIOR BASAL  GANGLIA WHICH HAS A BENIGN APPEARANCE.  IMPRESSION  NO ACUTE ABNORMALITY. 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    Time spent 60 min, >50% counseling and coordination of care.   Gordy Levan, MD   03/16/2014 12:15 PM

## 2014-03-17 ENCOUNTER — Ambulatory Visit
Admission: RE | Admit: 2014-03-17 | Discharge: 2014-03-17 | Disposition: A | Discharge status: Home / Self Care | Payer: Medicare Other | Source: Ambulatory Visit | Attending: Radiation Oncology | Admitting: Radiation Oncology

## 2014-03-17 ENCOUNTER — Encounter: Payer: Self-pay | Admitting: Radiation Oncology

## 2014-03-17 DIAGNOSIS — C50219 Malignant neoplasm of upper-inner quadrant of unspecified female breast: Secondary | ICD-10-CM | POA: Diagnosis not present

## 2014-03-17 DIAGNOSIS — Z510 Encounter for antineoplastic radiation therapy: Secondary | ICD-10-CM | POA: Diagnosis not present

## 2014-03-18 NOTE — Progress Notes (Signed)
  Radiation Oncology         [phone] ________________________________  Name: Meagan Wallace MRN: [id number]  Date: 03/17/2014  DOB: 09-16-1946  End of Treatment Note  Diagnosis:   DCIS of the right breast (close anterior margin)     Indication for treatment:  Curative       Radiation treatment dates:  02/02/2014-03/17/2014  Site/dose:    Right breast / 50 Gray @ 2 [place] Cables per fraction x 25 fractions Right breast boost / 10 Gray at [employer] per fraction x 5 fractions  Beams/energy:  Opposed Tangents / 6 and 15 MV MV photons 3 field/ 6 and 15 MV photons.  Narrative: The patient tolerated radiation treatment relatively well.   She had moist desquamation in the inframammary fold with dry desquamation.   Plan: The patient has completed radiation treatment. The patient will return to radiation oncology clinic for routine followup in one month. I advised them to call or return sooner if they have any questions or concerns related to their recovery or treatment. She will also follow up with medical oncology.   ------------------------------------------------  Thea Silversmith, MD

## 2014-03-19 DIAGNOSIS — I639 Cerebral infarction, unspecified: Secondary | ICD-10-CM | POA: Insufficient documentation

## 2014-03-19 DIAGNOSIS — E119 Type 2 diabetes mellitus without complications: Secondary | ICD-10-CM | POA: Insufficient documentation

## 2014-03-19 DIAGNOSIS — M8580 Other specified disorders of bone density and structure, unspecified site: Secondary | ICD-10-CM | POA: Insufficient documentation

## 2014-03-19 DIAGNOSIS — I10 Essential (primary) hypertension: Secondary | ICD-10-CM | POA: Insufficient documentation

## 2014-03-19 DIAGNOSIS — E669 Obesity, unspecified: Secondary | ICD-10-CM | POA: Insufficient documentation

## 2014-03-24 DIAGNOSIS — I10 Essential (primary) hypertension: Secondary | ICD-10-CM | POA: Diagnosis not present

## 2014-03-24 DIAGNOSIS — F341 Dysthymic disorder: Secondary | ICD-10-CM | POA: Diagnosis not present

## 2014-03-24 DIAGNOSIS — E119 Type 2 diabetes mellitus without complications: Secondary | ICD-10-CM | POA: Diagnosis not present

## 2014-03-24 DIAGNOSIS — E785 Hyperlipidemia, unspecified: Secondary | ICD-10-CM | POA: Diagnosis not present

## 2014-03-24 DIAGNOSIS — J019 Acute sinusitis, unspecified: Secondary | ICD-10-CM | POA: Diagnosis not present

## 2014-03-25 ENCOUNTER — Encounter: Payer: Self-pay | Admitting: Radiation Oncology

## 2014-03-25 ENCOUNTER — Ambulatory Visit
Admission: RE | Admit: 2014-03-25 | Discharge: 2014-03-25 | Disposition: A | Discharge status: Home / Self Care | Payer: Medicare Other | Source: Ambulatory Visit | Attending: Radiation Oncology | Admitting: Radiation Oncology

## 2014-03-25 VITALS — BP 129/78 | HR 95 | Temp 98.7°F | Wt 256.8 lb

## 2014-03-25 DIAGNOSIS — C50211 Malignant neoplasm of upper-inner quadrant of right female breast: Secondary | ICD-10-CM

## 2014-03-25 HISTORY — DX: Reserved for concepts with insufficient information to code with codable children: IMO0002

## 2014-03-25 HISTORY — DX: Reserved for inherently not codable concepts without codable children: IMO0001

## 2014-03-25 NOTE — Progress Notes (Signed)
PATIENT FOR ONE WEEK FOLLOW UP SKIN CHECK OF RADIATION TO RIGHT BREAST.NO MORE PEELING.PATIENT IS DIAPHORETIC.STARTED AMOXICILLIN ON 03/24/14 FOR URINARY TRACT INFECTION.DENIES PAIN." I'M JUST HOT."

## 2014-03-25 NOTE — Progress Notes (Signed)
   Department of Radiation Oncology  Phone:  [phone] Fax:        [phone]   Name: Meagan Wallace MRN: [id number]  DOB: [birth year]  Date: 03/25/2014  Follow Up Visit Note  Diagnosis: DCIS of the right breast  Summary and Interval since last radiation: 1 week from 61 Gy completed 03/17/14  Interval History: Meagan Wallace presents today for routine followup.  Dr. Sondra Come saw her last week and though her skin might get worse and asked her to follow up with me this week for a skin check. She has healed well she says. She is on an antibiotic for a UTI. She needed a bone scan and will see Dr. Marko Plume for follow up to discuss that further.   Allergies: No Known Allergies  Medications:  Current Outpatient Prescriptions  Medication Sig Dispense Refill  . albuterol (PROVENTIL) (2.5 MG/3ML) 0.083% nebulizer solution Take 2.5 mg by nebulization every 6 (six) hours as needed for wheezing or shortness of breath.      Marland Kitchen aluminum hydroxide-magnesium carbonate (GAVISCON) 95-358 MG/15ML SUSP Take by mouth.      Marland Kitchen amLODipine (NORVASC) 10 MG tablet Take 10 mg by mouth daily.      Marland Kitchen amoxicillin (AMOXIL) 500 MG capsule Take 500 mg by mouth 2 (two) times daily. Take x 10 days      . Ascorbic Acid (VITAMIN C PO) Take by mouth.      Marland Kitchen aspirin 325 MG tablet Take 325 mg by mouth daily.      . Cholecalciferol (VITAMIN D PO) Take by mouth.      . diphenhydrAMINE (BENADRYL) 25 MG tablet Take 25 mg by mouth 2 (two) times daily as needed.      . hydrochlorothiazide (HYDRODIURIL) 25 MG tablet Take 25 mg by mouth daily.      . metFORMIN (GLUCOPHAGE) 500 MG tablet Take 500 mg by mouth 2 (two) times daily with a meal.      . ramipril (ALTACE) 10 MG capsule Take 10 mg by mouth 2 (two) times daily.      . sertraline (ZOLOFT) 100 MG tablet Take 100 mg by mouth daily.      . Pseudoeph-Doxylamine-DM-APAP (NYQUIL MULTI-SYMPTOM PO) Take by mouth 2 (two) times daily. Takes am and pm makes her feel better       No  current facility-administered medications for this encounter.    Physical Exam:  Filed Vitals:   03/25/14 1523  BP: 129/78  Pulse: 95  Temp: 98.7 F (37.1 C)  Weight: 256 lb 12.8 oz (116.484 kg)  SpO2: 95%   Pink skin under breast. Rest of skin is pink/brown and healing as expected  IMPRESSION: Meagan Wallace is a 68 y.o. female s/p RT for DCIS with resolving acute effects of treatment  PLAN:  Re-eval to ensure full healing in 1 month. Follow up with medical oncology re: anitestrogen treatment.     Thea Silversmith, MD

## 2014-04-01 NOTE — Progress Notes (Signed)
Name: Meagan Wallace   MRN: [id number]  Date:  03/03/14  DOB: 11-Mar-1946  Status:outpatient    DIAGNOSIS: Breast cancer.  CONSENT VERIFIED: yes   SET UP: Patient is setup supine   IMMOBILIZATION:  The following immobilization was used:Custom Moldable Pillow, breast board.   NARRATIVE: [name] underwent complex simulation and treatment planning for her boost treatment today.  Her tumor volume was outlined on the planning CT scan.  Due to the depth of her cavity, electrons could not be used and a photon plan was developed. The plan will be prescribed to the 98% isodose line.   I personally supervised and approved the creation of 3 unique MLCs comprising 3 treatment devices.

## 2014-04-22 ENCOUNTER — Ambulatory Visit (INDEPENDENT_AMBULATORY_CARE_PROVIDER_SITE_OTHER): Payer: Medicare Other | Admitting: Surgery

## 2014-04-22 ENCOUNTER — Encounter (INDEPENDENT_AMBULATORY_CARE_PROVIDER_SITE_OTHER): Payer: Self-pay | Admitting: Surgery

## 2014-04-22 VITALS — BP 148/90 | HR 96 | Resp 24 | Ht 65.5 in | Wt 259.8 lb

## 2014-04-22 DIAGNOSIS — Z853 Personal history of malignant neoplasm of breast: Secondary | ICD-10-CM | POA: Insufficient documentation

## 2014-04-22 NOTE — Patient Instructions (Signed)
Return 1 year. Yearly mammogram.

## 2014-04-22 NOTE — Progress Notes (Signed)
NAME: Meagan Wallace       DOB: 11/19/1945           DATE: 04/22/2014        MRN: [id number]  CC:  No chief complaint on file.   Meagan Wallace is a 68 y.o.Marland Kitchenfemale 68 yo patient found to have abnormality in right breast on screening mammograms done in [place] (report not available in this EMR that I can locate). She had stereotactic biopsy 11-11-2013 at [facility] of 1.9 cm mass at 1200, path (S-350-15) DCIS with calcifications, high grade, reportedly ER PR negative. She had bilateral MRI breast in [facility] system 12-02-13 with 2.6 x 2.1 x 1.9 cm area at 1200 and other scattered areas of enhancement in breasts bilaterally. She had MRI guided biopsy of right lower outer quadrant breast with benign fibroadenoma with calcifications, no atypia or malignancy ([id number]). She had right breast radioactive seed localization lumpectomy  on 12-27-13, confirmed by radiograph of the specimen. Pathology [phone]) from the lumpectomy had scattered microscopic foci of intermediate grade DCIS focally < 1 mm to anterior margin, ER strongly positive at 90%, PR negative, and atypical lobular hyperplasia also noted. Pathologist reviewed initial core biopsy, which was of higher grade and ER PR negative. Radiation oncology and surgery did not recommend further excision. Patient began RT in midApril 2015, which is to complete tomorrow.  Note that at the time of her initial consultation with Dr Humphrey Rolls, the DCIS appeared ;hormone receptor negative   Pt doing well.   PFSH: She has had no significant changes since the last visit here.  ROS: There have been no significant changes since the last visit here  EXAM:  VS: BP 148/90  Pulse 96  Resp 24  Ht 5' 5.5" (1.664 m)  Wt 259 lb 12.8 oz (117.845 kg)  BMI 42.56 kg/m2  General: The patient is alert, oriented, generally healthy appearing, NAD. Mood and affect are normal.  Breasts:  right breast with minimal post op changes.  No  mass  Lymphatics: She has no axillary or supraclavicular adenopathy on either side.  Extremities: Full ROM of the surgical side with no lymphedema noted.  Data Reviewed: Notes from cancer  Impression: Doing well, with no evidence of recurrent cancer or new cancer  Plan: Will continue to follow up on an annual basis here.

## 2014-04-27 ENCOUNTER — Telehealth: Payer: Self-pay | Admitting: Radiation Oncology

## 2014-04-27 NOTE — Telephone Encounter (Signed)
SPOKE W/PT AS A REMINDER OF TOMORROW'S APPT W/DR. Pablo Ledger AT 8AM. (lw)

## 2014-04-28 ENCOUNTER — Ambulatory Visit
Admission: RE | Admit: 2014-04-28 | Discharge: 2014-04-28 | Disposition: A | Discharge status: Home / Self Care | Payer: Medicare Other | Source: Ambulatory Visit | Attending: Radiation Oncology | Admitting: Radiation Oncology

## 2014-04-28 ENCOUNTER — Encounter: Payer: Self-pay | Admitting: Radiation Oncology

## 2014-04-28 VITALS — BP 152/92 | HR 94 | Temp 99.0°F | Resp 22 | Ht 65.5 in | Wt 257.5 lb

## 2014-04-28 DIAGNOSIS — C50211 Malignant neoplasm of upper-inner quadrant of right female breast: Secondary | ICD-10-CM

## 2014-04-28 NOTE — Progress Notes (Signed)
Follow up s/p rad tx right breast 02/02/14-03/17/14, well healed, still some tanning under axilla and inframmary fold, no pain,  Still using radiaplex, , sees Dr. Marko Plume ion August 05/18/14,not taking any antisetogen medication as yet, appetite, good, sob rr=22 from mild exertion , energy level  Getting better 8:07 AM

## 2014-04-28 NOTE — Progress Notes (Signed)
.     Department of Radiation Oncology  Phone:  [phone] Fax:        [phone]   Name: Meagan Wallace MRN: [id number]  DOB: Oct 04, 1946  Date: 04/28/2014  Follow Up Visit Note  Diagnosis: DCIS of the right breast  Summary and Interval since last radiation: 1 week from 61 Gy completed 03/17/14  Interval History: Meagan Wallace presents today for routine followup.  Her skin has healed up well. She is seeing Dr. Marko Plume in August. She bought pictures of her grandson and his horse buggies.   Allergies: No Known Allergies  Medications:  Current Outpatient Prescriptions  Medication Sig Dispense Refill  . albuterol (PROVENTIL) (2.5 MG/3ML) 0.083% nebulizer solution Take 2.5 mg by nebulization every 6 (six) hours as needed for wheezing or shortness of breath.      Marland Kitchen aluminum hydroxide-magnesium carbonate (GAVISCON) 95-358 MG/15ML SUSP Take 30 mLs by mouth as needed.       Marland Kitchen amLODipine (NORVASC) 10 MG tablet Take 10 mg by mouth daily.      . Ascorbic Acid (VITAMIN C PO) Take 600 mg by mouth 2 (two) times daily.       Marland Kitchen aspirin 325 MG tablet Take 325 mg by mouth daily.      . Cholecalciferol (VITAMIN D PO) Take 500 mg by mouth at bedtime. Takes total 1000mg       . hydrochlorothiazide (HYDRODIURIL) 25 MG tablet Take 25 mg by mouth daily.      . metFORMIN (GLUCOPHAGE) 500 MG tablet Take 500 mg by mouth 2 (two) times daily with a meal.      . Pseudoeph-Doxylamine-DM-APAP (NYQUIL MULTI-SYMPTOM PO) Take 30 mLs by mouth 2 (two) times daily as needed. Takes am and pm makes her feel better      . ramipril (ALTACE) 10 MG capsule Take 10 mg by mouth 2 (two) times daily.      . sertraline (ZOLOFT) 100 MG tablet Take 100 mg by mouth daily.      . diphenhydrAMINE (BENADRYL) 25 MG tablet Take 25 mg by mouth 2 (two) times daily as needed.       No current facility-administered medications for this encounter.    Physical Exam:  Filed Vitals:   04/28/14 0759  BP: 152/92  Pulse: 94  Temp: 99 F  (37.2 C)  TempSrc: Oral  Resp: 22  Height: 5' 5.5" (1.664 m)  Weight: 257 lb 8 oz (116.801 kg)  SpO2: 96%  totally healed skin. Owens Shark under breast.   IMPRESSION: Meagan Wallace is a 68 y.o. female s/p RT for DCIS with resolving acute effects of treatment  PLAN:  Healed well. Discussed yearly mammograms. Discussed sun protection in the treated area. Discussed follow up here prn. Follow up with medical oncology re: anitestrogen treatment.     Thea Silversmith, MD

## 2014-05-07 ENCOUNTER — Other Ambulatory Visit: Payer: Self-pay | Admitting: Oncology

## 2014-05-11 DIAGNOSIS — H2589 Other age-related cataract: Secondary | ICD-10-CM | POA: Diagnosis not present

## 2014-05-11 DIAGNOSIS — H35039 Hypertensive retinopathy, unspecified eye: Secondary | ICD-10-CM | POA: Diagnosis not present

## 2014-05-11 DIAGNOSIS — E119 Type 2 diabetes mellitus without complications: Secondary | ICD-10-CM | POA: Diagnosis not present

## 2014-05-11 DIAGNOSIS — H40059 Ocular hypertension, unspecified eye: Secondary | ICD-10-CM | POA: Diagnosis not present

## 2014-05-11 DIAGNOSIS — H2510 Age-related nuclear cataract, unspecified eye: Secondary | ICD-10-CM | POA: Diagnosis not present

## 2014-05-18 ENCOUNTER — Telehealth: Payer: Self-pay | Admitting: Oncology

## 2014-05-18 ENCOUNTER — Ambulatory Visit (HOSPITAL_BASED_OUTPATIENT_CLINIC_OR_DEPARTMENT_OTHER): Payer: Medicare Other | Admitting: Oncology

## 2014-05-18 ENCOUNTER — Encounter: Payer: Self-pay | Admitting: Oncology

## 2014-05-18 VITALS — BP 153/82 | HR 84 | Temp 98.9°F | Resp 24 | Ht 65.0 in | Wt 258.7 lb

## 2014-05-18 DIAGNOSIS — E119 Type 2 diabetes mellitus without complications: Secondary | ICD-10-CM

## 2014-05-18 DIAGNOSIS — M899 Disorder of bone, unspecified: Secondary | ICD-10-CM

## 2014-05-18 DIAGNOSIS — M949 Disorder of cartilage, unspecified: Secondary | ICD-10-CM

## 2014-05-18 DIAGNOSIS — D0590 Unspecified type of carcinoma in situ of unspecified breast: Secondary | ICD-10-CM

## 2014-05-18 DIAGNOSIS — D0511 Intraductal carcinoma in situ of right breast: Secondary | ICD-10-CM

## 2014-05-18 DIAGNOSIS — Z170 Estrogen receptor positive status [ER+]: Secondary | ICD-10-CM

## 2014-05-18 NOTE — Telephone Encounter (Signed)
, °

## 2014-05-18 NOTE — Progress Notes (Signed)
OFFICE PROGRESS NOTE   05/18/2014   Physicians: PCP: Charlynn Court, NP (Dr Reinaldo Meeker, [place] and [place])  GYN: Remotely saw Dr Loni Muse. Ross, last PAP "years ago"  SU: [employer]  OTHER XN:TZGYF Dian Situ (cardiology, [place])   INTERVAL HISTORY:  Patient is seen, alone for visit, in follow up of DCIS right breast, this found on screening mammogram in [place] ~ 10-2013, post lumpectomy 12-27-13 and RT completed 03-17-14. Initial core biopsy of the DCIS was ER/PR negative, however in final path there was a component of ER + DCIS.   Patient has been progressively improving since completing radiation, with moist desquamation resolved and not much discomfort there now.  She was fatigued towards completion of RT, also now improved. She has ongoing significant hot flashes, which do tend to be worse in hot weather.  She has bilateral cataracts, left now requiring surgery which is to be scheduled upcoming.   Since I saw her initially in consultation on 03-16-14, I have found report of brain MRI 2002 which describes "chronic infarct left superior cerebellum". This fits with history of CVA ~ age 68, reportedly secondary to rheumatic heart disease, which affected the right side of her body prior to eventually regaining all of that function. She has had no neurologic concerns subsequently.  She is overdue gyn exam but is willing to have this done, which I have explained is needed if she begins prophylactic tamoxifen. She saw Dr Gus Height in past and we will try to refer back to that office. She has had no bleeding or other concerning gyn symptoms.  In next several weeks she is assisting sister after orthopedic surgery, activities with grandchildren before school resumes and the cataract surgery.  ONCOLOGIC HISTORY 68 yo patient found to have abnormality in right breast on screening mammograms done in [place] (report not available in this EMR that I can locate). She had  stereotactic biopsy 11-11-2013 at [facility] of 1.9 cm mass at 1200, path ([id number]) DCIS with calcifications, high grade, reportedly ER PR negative. She had bilateral MRI breast in [place] system 12-02-13 with 2.6 x 2.1 x 1.9 cm area at 1200 and other scattered areas of enhancement in breasts bilaterally. She had MRI guided biopsy of right lower outer quadrant breast with benign fibroadenoma with calcifications, no atypia or malignancy ([id number]). She had right breast radioactive seed localization lumpectomy by Dr Brantley Stage on 12-27-13, confirmed by radiograph of the specimen. Pathology [id number]) from the lumpectomy had scattered microscopic foci of intermediate grade DCIS focally < 1 mm to anterior margin, ER strongly positive at 90%, PR negative, and atypical lobular hyperplasia also noted. Pathologist reviewed initial core biopsy, which was of higher grade and ER PR negative. Radiation oncology and surgery did not recommend further excision. Patient received radiation by Dr Pablo Ledger from 02-02-14 thru 03-17-14, 50 Gy to right breast with 10 Gy boost; she had follow up with Dr Pablo Ledger on 04-28-14 and will be seen back at that office prn. Note that at the time of her initial consultation with Dr Humphrey Rolls, the DCIS appeared hormone receptor negative.    Review of systems as above, also: Slight swelling RLE >LLE stable. No new or residual neurologic deficits. No changes in left breast. No increased SOB or chest symptoms. Does not follow diet regularly. Degenerative left knee symptoms unchanged. Remainder of 10 point Review of Systems negative.  Objective:  Vital signs in last 24 hours:  BP 153/82  Pulse 84  Temp(Src) 98.9 F (  37.2 C) (Oral)  Resp 24  Ht 5\' 5"  (1.651 m)  Wt 258 lb 11.2 oz (117.346 kg)  BMI 43.05 kg/m2 weight is down 3 lbs.  Alert, oriented and appropriate. Ambulatory without assistance tho favors left knee.  PERRL Right breast with well healed lumpectomy scar at areola,  fullness consistent with seroma ~ 4x5 cm, not tender, no heat or erythema, no skin changes. No palpable mass elsewhere in breast.  No swelling RUE. No pitting edema, cords, tenderness bilat LE.   Lab Results:  Results for orders placed in visit on 03/16/14  CBC WITH DIFFERENTIAL      Result Value Ref Range   WBC 8.8  3.9 - 10.3 10e3/uL   NEUT# 7.1 (*) 1.5 - 6.5 10e3/uL   HGB 13.1  11.6 - 15.9 g/dL   HCT 40.1  34.8 - 46.6 %   Platelets 178  145 - 400 10e3/uL   MCV 79.4 (*) 79.5 - 101.0 fL   MCH 26.0  25.1 - 34.0 pg   MCHC 32.8  31.5 - 36.0 g/dL   RBC 5.05  3.70 - 5.45 10e6/uL   RDW 15.1 (*) 11.2 - 14.5 %   lymph# 0.7 (*) 0.9 - 3.3 10e3/uL   MONO# 0.9  0.1 - 0.9 10e3/uL   Eosinophils Absolute 0.1  0.0 - 0.5 10e3/uL   Basophils Absolute 0.1  0.0 - 0.1 10e3/uL   NEUT% 81.0 (*) 38.4 - 76.8 %   LYMPH% 7.8 (*) 14.0 - 49.7 %   MONO% 9.7  0.0 - 14.0 %   EOS% 0.9  0.0 - 7.0 %   BASO% 0.6  0.0 - 2.0 %  COMPREHENSIVE METABOLIC PANEL (VO16)      Result Value Ref Range   Sodium 141  136 - 145 mEq/L   Potassium 4.3  3.5 - 5.1 mEq/L   Chloride 101  98 - 109 mEq/L   CO2 25  22 - 29 mEq/L   Glucose 105  70 - 140 mg/dl   BUN 13.9  7.0 - 26.0 mg/dL   Creatinine 0.7  0.6 - 1.1 mg/dL   Total Bilirubin 0.64  0.20 - 1.20 mg/dL   Alkaline Phosphatase 85  40 - 150 U/L   AST 14  5 - 34 U/L   ALT 16  0 - 55 U/L   Total Protein 7.2  6.4 - 8.3 g/dL   Albumin 3.4 (*) 3.5 - 5.0 g/dL   Calcium 9.6  8.4 - 10.4 mg/dL   Anion Gap 15 (*) 3 - 11 mEq/L     Studies/Results:  Bone density scan [facility] 09-29-13 with femoral neck T score - 1.3 and L spine difficult to read   Medications: I have reviewed the patient's current medications. 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  DISCUSSION: we have reviewed rationale for prophylactic tamoxifen, for possibility that this will  decrease chance of another ER + breast malignancy. The tamoxifen will not improve risk of ER negative breast malignancy, may increase hot flashes, can increase risk of blood clots including CVA, can increase cataract development and has some low risk of uterine malignancy. Her childhood CVA history does not seem to contraindicate prophylactic tamoxifen. Her cataracts are already being addressed. She understands that she can stop the tamoxifen at any time if not tolerated, and that she will be closely monitored whether or not she takes  tamoxifen. She does want to try tamoxifen. As this does not need to begin immediately, she will have cataract surgery and gyn exam first, and I will see her back in ~ 2 months, which will also let her begin in cooler weather. Prescription to be sent to pharmacy now, but she understands that she can wait to begin until after other interventions or even after next visit to this office.  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 Radiation therapy completed  03-17-14. Systemic hormonal blockade in preventative attempt with tamoxifen anticipated. (Aromatase inhibitors, tho used for DCIS at times, are not presently approved for this indication). . 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 She does not monitor at home.  4.overdue gyn exam, no hysterectomy. Refer to gyn, possibly Dr Gus Height. 5.obesity with BMI 43.4, likely causing the diabetes.  6.HTN, ? Other cardiac concerns, followed by cardiologist in [place] yearly. May have had lipids checked there  7.on zoloft for hot flashes. 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  7. osteopenia  by recent [facility] density scan [facility]    Patient has had questions answered to her satisfaction and is in agreement with plan above.  Time spent 35 min including >5-% counseling and coordination of care.   LIVESAY,LENNIS P, MD   05/18/2014, 9:10 AM

## 2014-05-20 DIAGNOSIS — H40009 Preglaucoma, unspecified, unspecified eye: Secondary | ICD-10-CM | POA: Diagnosis not present

## 2014-06-22 DIAGNOSIS — Z6841 Body Mass Index (BMI) 40.0 and over, adult: Secondary | ICD-10-CM | POA: Diagnosis not present

## 2014-06-22 DIAGNOSIS — E785 Hyperlipidemia, unspecified: Secondary | ICD-10-CM | POA: Diagnosis not present

## 2014-06-22 DIAGNOSIS — E119 Type 2 diabetes mellitus without complications: Secondary | ICD-10-CM | POA: Diagnosis not present

## 2014-06-22 DIAGNOSIS — J45909 Unspecified asthma, uncomplicated: Secondary | ICD-10-CM | POA: Diagnosis not present

## 2014-06-22 DIAGNOSIS — I10 Essential (primary) hypertension: Secondary | ICD-10-CM | POA: Diagnosis not present

## 2014-07-08 ENCOUNTER — Other Ambulatory Visit: Payer: Self-pay | Admitting: Obstetrics and Gynecology

## 2014-07-08 DIAGNOSIS — Z124 Encounter for screening for malignant neoplasm of cervix: Secondary | ICD-10-CM | POA: Diagnosis not present

## 2014-07-11 LAB — CYTOLOGY - PAP

## 2014-07-18 ENCOUNTER — Ambulatory Visit: Payer: Medicare Other | Admitting: Oncology

## 2014-08-01 ENCOUNTER — Telehealth: Payer: Self-pay

## 2014-08-01 NOTE — Telephone Encounter (Signed)
Call patient's home as this the only number in EMR. Pt. Not home either time and husband did not know when she would be home.  He does  Not seem to be a good or reliable historian. Patient does not need to start tamoxifen prior to appointment with Dr. Marko Plume 08-08-14.  Medication not sent to her pharmacy in August as Dr. Marko Plume noted in her office note. Trying to verify if patient had cataract surgery and gym exam as Dr. Marko Plume wanted these completed prior to starting tamoxifen.

## 2014-08-07 ENCOUNTER — Other Ambulatory Visit: Payer: Self-pay | Admitting: Oncology

## 2014-08-08 ENCOUNTER — Telehealth: Payer: Self-pay | Admitting: Oncology

## 2014-08-08 ENCOUNTER — Encounter: Payer: Self-pay | Admitting: Oncology

## 2014-08-08 ENCOUNTER — Ambulatory Visit (HOSPITAL_BASED_OUTPATIENT_CLINIC_OR_DEPARTMENT_OTHER): Payer: Medicare Other | Admitting: Oncology

## 2014-08-08 VITALS — BP 162/90 | HR 85 | Temp 98.4°F | Resp 18 | Ht 65.0 in | Wt 252.6 lb

## 2014-08-08 DIAGNOSIS — Z23 Encounter for immunization: Secondary | ICD-10-CM

## 2014-08-08 DIAGNOSIS — F99 Mental disorder, not otherwise specified: Secondary | ICD-10-CM | POA: Diagnosis not present

## 2014-08-08 DIAGNOSIS — C50211 Malignant neoplasm of upper-inner quadrant of right female breast: Secondary | ICD-10-CM

## 2014-08-08 DIAGNOSIS — D0511 Intraductal carcinoma in situ of right breast: Secondary | ICD-10-CM | POA: Diagnosis not present

## 2014-08-08 DIAGNOSIS — E119 Type 2 diabetes mellitus without complications: Secondary | ICD-10-CM

## 2014-08-08 DIAGNOSIS — M8580 Other specified disorders of bone density and structure, unspecified site: Secondary | ICD-10-CM | POA: Diagnosis not present

## 2014-08-08 DIAGNOSIS — Z170 Estrogen receptor positive status [ER+]: Secondary | ICD-10-CM

## 2014-08-08 DIAGNOSIS — Z87898 Personal history of other specified conditions: Secondary | ICD-10-CM

## 2014-08-08 DIAGNOSIS — Z8659 Personal history of other mental and behavioral disorders: Secondary | ICD-10-CM

## 2014-08-08 MED ORDER — TAMOXIFEN CITRATE 20 MG PO TABS: 20.0000 mg | ORAL_TABLET | Freq: Every day | ORAL | Status: DC

## 2014-08-08 MED ORDER — INFLUENZA VAC SPLIT QUAD 0.5 ML IM SUSY
0.5000 mL | PREFILLED_SYRINGE | Freq: Once | INTRAMUSCULAR | Status: AC
  Administered 2014-08-08: 0.5 mL via INTRAMUSCULAR
  Filled 2014-08-08: qty 0.5

## 2014-08-08 NOTE — Patient Instructions (Signed)
OK to begin tamoxifen while you are on zoloft, as we see if you tolerate this. We will ask Dr Caprice Beaver to see you again, in case there are options other than the zoloft that would be better with tamoxifen, as zoloft can lessen effectiveness of the tamoxifen (will not make you have worse side effects, just will not let the tamoxifen work as well in preventing breast cancer)  Call if you have questions or concerns prior to next visit with Dr Marko Plume   [phone]  Fine to have flu vaccine

## 2014-08-08 NOTE — Telephone Encounter (Signed)
, °

## 2014-08-08 NOTE — Progress Notes (Signed)
OFFICE PROGRESS NOTE   08/08/2014   Physicians:PCP: Charlynn Court, NP /Dr Reinaldo Meeker, [place] and [place] [phone]  GYN: A.Ross SU: [employer]  OTHER OE:VOJJK Dian Situ (cardiology, [place]), Letta Moynahan (psych)   INTERVAL HISTORY:  Patient is seen (with 70 yo great grandson) in follow up of DCIS right breast, as we consider trial on tamoxifen. Treatment to date has been lumpectomy 12-27-13 and radiation completed 03-17-14. Last bilateral mammograms were at [facility] 09-29-13, and I understand that these have been ordered again by PCP (?)  Since I met patient in August, she re-established with Dr Harrington Challenger, with gyn exam done, per patient "good exam and ok for tamoxifen". She is aware that she should let Dr Harrington Challenger know if any bleeding.  She is for cataract surgery in November. She tells me today that she has been on Zoloft since ~ 2000, begun by Dr Letta Moynahan for psychiatric indications rather than for hot flashes, including hallucinations. Patient recalls that she was told that she would have to remain on zoloft "the rest of my life". She does not recall if any other medications were tried, and she has been stable on zoloft since then. She tells me that she could not possibly risk recurrence of the problems she had prior to zoloft. She is willing to be seen back by Dr Caprice Beaver, as I have explained that zoloft prevents tamoxifen from working optimally.  She has had no changes medically since I saw her last, including no concerns about either breast. Hot flashes continue very bothersome for her, tho they do tend to improve in cooler weather.  She does not have central catheter She is given flu vaccine today.   ONCOLOGIC HISTORY 68 yo patient found to have abnormality in right breast on screening mammograms done in [place] (report not available in this EMR that I can locate). She had stereotactic biopsy 11-11-2013 at [facility] of 1.9 cm mass at 1200, path  ([id number]) DCIS with calcifications, high grade, reportedly ER PR negative. She had bilateral MRI breast in [place] system 12-02-13 with 2.6 x 2.1 x 1.9 cm area at 1200 and other scattered areas of enhancement in breasts bilaterally. She had MRI guided biopsy of right lower outer quadrant breast with benign fibroadenoma with calcifications, no atypia or malignancy ([id number]). She had right breast radioactive seed localization lumpectomy by Dr Brantley Stage on 12-27-13, confirmed by radiograph of the specimen. Pathology [id number]) from the lumpectomy had scattered microscopic foci of intermediate grade DCIS focally < 1 mm to anterior margin, ER strongly positive at 90%, PR negative, and atypical lobular hyperplasia also noted. Pathologist reviewed initial core biopsy, which was of higher grade and ER PR negative. Radiation oncology and surgery did not recommend further excision. Patient received radiation by Dr Pablo Ledger from 02-02-14 thru 03-17-14, 50 Gy to right breast with 10 Gy boost; she had follow up with Dr Pablo Ledger on 04-28-14 and will be seen back at that office prn.  Note that at the time of her initial consultation with Dr Humphrey Rolls, the DCIS appeared hormone receptor negative.  Review of systems as above, also: No fever or symptoms of infection. No new or different pain. No SOB with activity in exam room. No bleeding. Remainder of 10 point Review of Systems negative.  Objective:  Vital signs in last 24 hours:  BP 162/90  Pulse 85  Temp(Src) 98.4 F (36.9 C) (Oral)  Resp 18  Ht 5' 5"  (1.651 m)  Wt 252 lb 9.6 oz (  114.579 kg)  BMI 42.03 kg/m2 Weight is down 6 lbs Alert, oriented and appropriate. Ambulatory without assistance.   Patient preferred not to undress fully today due to 68 yo in room, tho I was able to do exam of right breast.  HEENT:PERRL, sclerae not icteric. Oral mucosa moist without lesions, posterior pharynx clear.  Neck supple. No JVD.  Lymphatics:no cervical,suraclavicular  adenopathy Resp: clear to auscultation bilaterally  Cardio: regular rate and rhythm. No gallop. GI: abdomen obese,, soft, nontender, not distended Musculoskeletal/ Extremities: without pitting edema, cords, tenderness Neuro: speech fluent and appropriate, otherwise nonfocal. PSYCH appropriate mood and affect Skin without rash, ecchymosis, petechiae Breasts: right with well healed lumpectomy scar, otherwise without dominant mass, skin or nipple findings.    Lab Results:  Results for orders placed in visit on 07/08/14  CYTOLOGY - PAP      Result Value Ref Range   CYTOLOGY - PAP PAP RESULT       Studies/Results:  No results found.  Medications: I have reviewed the patient's current medications. Zoloft 100 mg daily  DISCUSSION: interaction with zoloft and tamoxifen discussed in general. She understands that only a portion of the DCIS was ER +, but still may have benefit from tamoxifen in preventing subsequent ER + cancer. Possibly Dr Caprice Beaver will have other recommendations, and she will at least begin tamoxifen to see if hot flashes are tolerable while still on zoloft now.   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 Radiation therapy completed 03-17-14. Systemic hormonal blockade in preventative attempt with tamoxifen now, Zoloft considerations as above. I will see her back.in 6-8 weeks in follow up. (Aromatase inhibitors, tho used for DCIS at times, are not presently approved for this indication).  . 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 She does not monitor at home.  4.overdue gyn exam, no hysterectomy. Refer to gyn, possibly Dr Gus Height.  5.obesity with BMI 43.4, likely causing the diabetes.   6.HTN, ? Other cardiac concerns, followed by cardiologist in [place] yearly. May have had lipids checked there  7.on zoloft not for hot flashes as I had understood from initial visit, but for psychiatric indications.  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  7. osteopenia by recent bone density scan at [facility]    Patient knows that she can call prior to next appointment if needed, and that I am glad to speak with Dr Caprice Beaver or her other MDs if needed. All questions answered and she is in agreement with plan. Time spent 25 min including >50% counseling and coordination of care.    Suliman Termini P, MD   08/08/2014, 9:20 AM

## 2014-08-19 ENCOUNTER — Other Ambulatory Visit: Payer: Self-pay | Admitting: Nurse Practitioner

## 2014-08-19 DIAGNOSIS — Z853 Personal history of malignant neoplasm of breast: Secondary | ICD-10-CM

## 2014-08-23 ENCOUNTER — Telehealth: Payer: Self-pay | Admitting: *Deleted

## 2014-08-23 NOTE — Telephone Encounter (Signed)
Called back and Lucita Lora, NP was gone home for the day. Receptionist will leave her a note to call us back tomorrow in regards to pt being on zoloft.

## 2014-08-23 NOTE — Telephone Encounter (Signed)
-----   Message from Gordy Levan, MD sent at 08/23/2014  4:13 PM EST ----- Regarding: RE: Tamoxifen & Zoloft Completely off would be best with tamoxifen. However, she was started on sertraline for psychiatric indications, so she really may need psychiatry to give opinion on substitute or whether she can come off and how quickly. Please be sure the NP has my last note, and please put contact phone for NP into EMR in case I need to call. thanks ----- Message -----    From: Christa See, RN    Sent: 08/23/2014   3:18 PM      To: Gordy Levan, MD Subject: Tamoxifen & Zoloft                             Received VM from Mertha Baars, NP in [place] (patient's PCP). She is asking if pt needs to be off sertraline completely since she is starting taxoxifen due to interaction or if a reduced dose is okay? Please advise and I will call her back. Cyril Mourning

## 2014-08-24 NOTE — Telephone Encounter (Signed)
Spoke with Ms. Doree Fudge NP again.  She said that her staff contacted Dr. Arvil Persons office.  His staff was rude to her staff and said that it had been 18 months since she has been seen.  The practitioner  Ms. Mclees  was seeing has retired. The practice does not accept Medicare now.  She would have to pay ~200 or so out of pocket. Ms. Doree Fudge will look into an alternative antidepressant for Ms. Caponigro.  She will contact Dr. Marko Plume if she needs to discuss medications.

## 2014-08-24 NOTE — Telephone Encounter (Addendum)
Spoke with  Ms Meagan Wallace regarding The interactions with Zoloft and Tamoxifen as noted below by Dr. Marko Plume. Ms. Meagan Wallace has Dr. Marko Plume office note from 08-08-14. Told Ms. Meagan Wallace patient to start Tamoxifen as noted in 08-08-14 note. Ms Meagan Wallace will talk with Ms. Vanlue and discuss seeing Dr. Caprice Beaver.   Dr. Marko Plume actually sent referral to his office.

## 2014-09-05 DIAGNOSIS — H2511 Age-related nuclear cataract, right eye: Secondary | ICD-10-CM | POA: Diagnosis not present

## 2014-09-05 DIAGNOSIS — H02839 Dermatochalasis of unspecified eye, unspecified eyelid: Secondary | ICD-10-CM | POA: Diagnosis not present

## 2014-09-05 DIAGNOSIS — I10 Essential (primary) hypertension: Secondary | ICD-10-CM | POA: Diagnosis not present

## 2014-09-05 DIAGNOSIS — H25011 Cortical age-related cataract, right eye: Secondary | ICD-10-CM | POA: Diagnosis not present

## 2014-09-05 DIAGNOSIS — E119 Type 2 diabetes mellitus without complications: Secondary | ICD-10-CM | POA: Diagnosis not present

## 2014-09-13 DIAGNOSIS — R0609 Other forms of dyspnea: Secondary | ICD-10-CM | POA: Diagnosis not present

## 2014-09-13 DIAGNOSIS — Z01810 Encounter for preprocedural cardiovascular examination: Secondary | ICD-10-CM | POA: Diagnosis not present

## 2014-09-25 ENCOUNTER — Other Ambulatory Visit: Payer: Self-pay | Admitting: Oncology

## 2014-09-29 DIAGNOSIS — H25012 Cortical age-related cataract, left eye: Secondary | ICD-10-CM | POA: Diagnosis not present

## 2014-09-29 DIAGNOSIS — H2512 Age-related nuclear cataract, left eye: Secondary | ICD-10-CM | POA: Diagnosis not present

## 2014-09-29 DIAGNOSIS — H268 Other specified cataract: Secondary | ICD-10-CM | POA: Diagnosis not present

## 2014-09-30 ENCOUNTER — Ambulatory Visit
Admission: RE | Admit: 2014-09-30 | Discharge: 2014-09-30 | Disposition: A | Discharge status: Home / Self Care | Payer: Medicare Other | Source: Ambulatory Visit | Attending: Nurse Practitioner | Admitting: Nurse Practitioner

## 2014-09-30 DIAGNOSIS — R920 Mammographic microcalcification found on diagnostic imaging of breast: Secondary | ICD-10-CM | POA: Diagnosis not present

## 2014-09-30 DIAGNOSIS — H2511 Age-related nuclear cataract, right eye: Secondary | ICD-10-CM | POA: Diagnosis not present

## 2014-09-30 DIAGNOSIS — Z853 Personal history of malignant neoplasm of breast: Secondary | ICD-10-CM | POA: Diagnosis not present

## 2014-10-03 ENCOUNTER — Ambulatory Visit (HOSPITAL_BASED_OUTPATIENT_CLINIC_OR_DEPARTMENT_OTHER): Payer: Medicare Other | Admitting: Oncology

## 2014-10-03 ENCOUNTER — Telehealth: Payer: Self-pay | Admitting: Oncology

## 2014-10-03 ENCOUNTER — Encounter: Payer: Self-pay | Admitting: Oncology

## 2014-10-03 VITALS — BP 152/96 | HR 93 | Temp 98.2°F | Resp 18 | Ht 65.0 in | Wt 251.7 lb

## 2014-10-03 DIAGNOSIS — M8580 Other specified disorders of bone density and structure, unspecified site: Secondary | ICD-10-CM

## 2014-10-03 DIAGNOSIS — F329 Major depressive disorder, single episode, unspecified: Secondary | ICD-10-CM

## 2014-10-03 DIAGNOSIS — D0511 Intraductal carcinoma in situ of right breast: Secondary | ICD-10-CM

## 2014-10-03 DIAGNOSIS — N951 Menopausal and female climacteric states: Secondary | ICD-10-CM

## 2014-10-03 DIAGNOSIS — F32A Depression, unspecified: Secondary | ICD-10-CM

## 2014-10-03 NOTE — Progress Notes (Signed)
OFFICE PROGRESS NOTE   10/03/2014   Physicians:PCP: Charlynn Court, NP /Dr Reinaldo Meeker, [place] and [place] [phone]  GYN: A.PrescottWallmeyer SU: Thomas Cornett  OTHER FK:CLEXN Dian Situ (cardiology, [place]), Letta Moynahan (psych))  INTERVAL HISTORY:   Patient is seen, alone for visit, in follow up of DCIS right breast, on tamoxifen in preventative attempt since 08-23-14. She was weaned down and off zoloft, then begun on venlafaxine 75 mg daily beginning 08-31-14. She had bilateral diagnostic mammograms at [facility] 09-30-14 with 3 new areas of microcalcifications at lumpectomy scar, which radiologist feels are more likely related to developing fat necrosis than recurrent DCIS, 6 month follow up recommended. She saw Dr Brantley Stage last 04-2014 with 3 month follow up recommended, appointment scheduled now.  Patient had some issues during changes in antidepressants, but feels comfortable now on the venlafaxine, including able to sleep and good energy and appetite. She has not had any increased hot flashes on tamoxifen, and is otherwise tolerating that medication well.   She is up to date on gyn exams, most recently by Dr Harrington Challenger early fall 2015. She saw Dr Jimmie Molly earlier in 09-2014, EKG unchanged per patient. She had cataract removed from left eye 09-29-14, right cataract scheduled for 10-20-14.   No central catheter Flu vaccine done  ONCOLOGIC HISTORY 68 yo patient found to have abnormality in right breast on screening mammograms done in [place] (report not available in this EMR that I can locate). She had stereotactic biopsy 11-11-2013 at [facility] of 1.9 cm mass at 1200, path (S-350-15) DCIS with calcifications, high grade, reportedly ER PR negative. She had bilateral MRI breast in [place] system 12-02-13 with 2.6 x 2.1 x 1.9 cm area at 1200 and other scattered areas of enhancement in breasts bilaterally. She had MRI guided biopsy of right lower  outer quadrant breast with benign fibroadenoma with calcifications, no atypia or malignancy ([id number]). She had right breast radioactive seed localization lumpectomy by Dr Brantley Stage on 12-27-13, confirmed by radiograph of the specimen. Pathology [id number]) from the lumpectomy had scattered microscopic foci of intermediate grade DCIS focally < 1 mm to anterior margin, ER strongly positive at 90%, PR negative, and atypical lobular hyperplasia also noted. Pathologist reviewed initial core biopsy, which was of higher grade and ER PR negative. Radiation oncology and surgery did not recommend further excision. Patient received radiation by Dr Pablo Ledger from 02-02-14 thru 03-17-14, 50 Gy to right breast with 10 Gy boost; she had follow up with Dr Pablo Ledger on 04-28-14 and will be seen back at that office prn.  Note that at the time of her initial consultation with Dr Humphrey Rolls, the DCIS appeared hormone receptor negative. She was weaned down and off zoloft around time of starting tamoxifen in 08-2014.  Review of systems as above, also: No problems with left cataract surgery. No fever or symptoms of infection. Blood sugars 107 - 118 fasting. No changes that she can tell in breasts. Bladder and bowel function improved since off zoloft. No bleeding. Remainder of 10 point Review of Systems negative.  Objective:  Vital signs in last 24 hours:  BP 152/96 mmHg  Pulse 93  Temp(Src) 98.2 F (36.8 C) (Oral)  Resp 18  Ht 5\' 5"  (1.651 m)  Wt 251 lb 11.2 oz (114.17 kg)  BMI 41.88 kg/m2 weight is down 1 lb  Alert, oriented and appropriate. Ambulatory without difficulty.   HEENT:PERRL, sclerae not icteric. Oral mucosa moist without lesions, posterior pharynx clear.  Neck supple. No JVD.  Lymphatics:no cervical,supraclavicular, axillary adenopathy Resp: clear to auscultation bilaterally and normal percussion bilaterally Cardio: regular rate and rhythm. No gallop. QT:MAUQJFH soft, nontender, not distended, no mass or  organomegaly. Normally active bowel sounds.  Musculoskeletal/ Extremities: without pitting edema, cords, tenderness PSYCH appropriate mood and affect Skin without rash, ecchymosis, petechiae Breasts: Right lumpectomy scar appears well healed without changes obvious for recurrence, otherwise no skin or nipple findings.Left breast no dominant mass, no skin or nipple findings. Axillae benign.   Lab Results: Labs not repeated   EXAM: DIGITAL DIAGNOSTIC BILATERAL MAMMOGRAM WITH CAD  COMPARISON: Previous examinations at the [facility] and at [facility].  ACR Breast Density Category b: There are scattered areas of fibroglandular density.  FINDINGS: Postlumpectomy changes are demonstrated in the central right breast, medially as well as postradiation changes. A coil shaped biopsy marker clip in the outer right breast, from a benign stereotactic biopsy, remains in place. There has been interval development of 3 small groups of irregular microcalcifications along the medial aspect of the lumpectomy site. These measure 3-4 mm in maximum span each. These were not present on the previous examinations, the most recent dated 01/20/2014. No findings elsewhere in either breast suspicious for malignancy.  Mammographic images were processed with CAD.  IMPRESSION: Interval development of 3 small groups of irregular microcalcifications along the medial aspect of the lumpectomy site. These are indeterminate and may represent calcifications associated with developing fat necrosis. Ductal carcinoma in situ is less likely.  RECOMMENDATION: Right diagnostic mammogram in 6 months unless stereotactic guided core needle biopsy is clinically desired at this time.  I have discussed the findings and recommendations with the patient. Results were also provided in writing at the conclusion of the visit. If applicable, a reminder letter will be sent to the  patient regarding the next appointment.  BI-RADS CATEGORY 3: Probably benign.   Medications: I have reviewed the patient's current medications. Continue tamoxifen 20 mg daily.  DISCUSSION: patient is pleased with changes in medication and how she is tolerating tamoxifen thus far. As she is overdue visit with Dr Brantley Stage, and with changes as noted on mammogram at lumpectomy site, I have spoken with his office and set up return appointment for early Jan.  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 Radiation therapy completed 03-17-14. Systemic hormonal blockade in preventative attempt with tamoxifen now. New microcalcifications at lumpectomy scar by recent mammograms, repeat imaging 6 months (June 2016) and scheduled to Dr Brantley Stage early Jan. I will see her in ~ 4 months. 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  5.obesity, BMI 43.4  6.HTN. Stable EKG at recent follow up with cardiology 7.off zoloft, on venlafaxine due to interactions with tamoxifen. Appreciate assistance from her primary provider with this. The zoloft was for psychiatric indications, not for hot flashes. 8. osteopenia by recent bone density scan at [facility] extraction done OS and pending OS   Patient comfortable with discussion and plans as above. Cc this note to PCP, Dr Jimmie Molly and Dr Brantley Stage Time spent 25 min including >50% counseling and coordination of care  LIVESAY,LENNIS P, MD   10/03/2014, 10:46 AM

## 2014-10-03 NOTE — Telephone Encounter (Signed)
, °

## 2014-10-06 DIAGNOSIS — M8580 Other specified disorders of bone density and structure, unspecified site: Secondary | ICD-10-CM | POA: Insufficient documentation

## 2014-10-06 DIAGNOSIS — F32A Depression, unspecified: Secondary | ICD-10-CM | POA: Insufficient documentation

## 2014-10-06 DIAGNOSIS — N951 Menopausal and female climacteric states: Secondary | ICD-10-CM | POA: Insufficient documentation

## 2014-10-06 DIAGNOSIS — F329 Major depressive disorder, single episode, unspecified: Secondary | ICD-10-CM | POA: Insufficient documentation

## 2014-10-17 DIAGNOSIS — I10 Essential (primary) hypertension: Secondary | ICD-10-CM | POA: Diagnosis not present

## 2014-10-17 DIAGNOSIS — Z6841 Body Mass Index (BMI) 40.0 and over, adult: Secondary | ICD-10-CM | POA: Diagnosis not present

## 2014-10-17 DIAGNOSIS — E119 Type 2 diabetes mellitus without complications: Secondary | ICD-10-CM | POA: Diagnosis not present

## 2014-10-17 DIAGNOSIS — F341 Dysthymic disorder: Secondary | ICD-10-CM | POA: Diagnosis not present

## 2014-10-17 DIAGNOSIS — E785 Hyperlipidemia, unspecified: Secondary | ICD-10-CM | POA: Diagnosis not present

## 2014-10-20 DIAGNOSIS — H25011 Cortical age-related cataract, right eye: Secondary | ICD-10-CM | POA: Diagnosis not present

## 2014-10-20 DIAGNOSIS — H269 Unspecified cataract: Secondary | ICD-10-CM | POA: Diagnosis not present

## 2014-10-20 DIAGNOSIS — H2511 Age-related nuclear cataract, right eye: Secondary | ICD-10-CM | POA: Diagnosis not present

## 2014-10-21 ENCOUNTER — Other Ambulatory Visit: Payer: Self-pay | Admitting: *Deleted

## 2014-10-21 DIAGNOSIS — D0511 Intraductal carcinoma in situ of right breast: Secondary | ICD-10-CM

## 2014-10-21 MED ORDER — TAMOXIFEN CITRATE 20 MG PO TABS: 20.0000 mg | ORAL_TABLET | Freq: Every day | ORAL | Status: DC

## 2014-10-24 ENCOUNTER — Other Ambulatory Visit: Payer: Self-pay | Admitting: Surgery

## 2014-10-24 DIAGNOSIS — Z87898 Personal history of other specified conditions: Secondary | ICD-10-CM | POA: Diagnosis not present

## 2014-10-24 DIAGNOSIS — R920 Mammographic microcalcification found on diagnostic imaging of breast: Secondary | ICD-10-CM | POA: Diagnosis not present

## 2014-10-24 DIAGNOSIS — R921 Mammographic calcification found on diagnostic imaging of breast: Secondary | ICD-10-CM

## 2014-10-24 DIAGNOSIS — D0511 Intraductal carcinoma in situ of right breast: Secondary | ICD-10-CM

## 2014-10-26 ENCOUNTER — Other Ambulatory Visit: Payer: Self-pay | Admitting: Surgery

## 2014-10-26 DIAGNOSIS — D0511 Intraductal carcinoma in situ of right breast: Secondary | ICD-10-CM

## 2014-10-26 DIAGNOSIS — R921 Mammographic calcification found on diagnostic imaging of breast: Secondary | ICD-10-CM

## 2014-11-03 ENCOUNTER — Ambulatory Visit
Admission: RE | Admit: 2014-11-03 | Discharge: 2014-11-03 | Disposition: A | Discharge status: Home / Self Care | Payer: Medicare Other | Source: Ambulatory Visit | Attending: Surgery | Admitting: Surgery

## 2014-11-03 DIAGNOSIS — D241 Benign neoplasm of right breast: Secondary | ICD-10-CM | POA: Diagnosis not present

## 2014-11-03 DIAGNOSIS — D0511 Intraductal carcinoma in situ of right breast: Secondary | ICD-10-CM

## 2014-11-03 DIAGNOSIS — N641 Fat necrosis of breast: Secondary | ICD-10-CM | POA: Diagnosis not present

## 2014-11-03 DIAGNOSIS — R921 Mammographic calcification found on diagnostic imaging of breast: Secondary | ICD-10-CM

## 2014-11-03 DIAGNOSIS — Z853 Personal history of malignant neoplasm of breast: Secondary | ICD-10-CM | POA: Diagnosis not present

## 2014-11-03 DIAGNOSIS — R920 Mammographic microcalcification found on diagnostic imaging of breast: Secondary | ICD-10-CM | POA: Diagnosis not present

## 2015-01-17 DIAGNOSIS — F341 Dysthymic disorder: Secondary | ICD-10-CM | POA: Diagnosis not present

## 2015-01-17 DIAGNOSIS — I10 Essential (primary) hypertension: Secondary | ICD-10-CM | POA: Diagnosis not present

## 2015-01-17 DIAGNOSIS — Z6841 Body Mass Index (BMI) 40.0 and over, adult: Secondary | ICD-10-CM | POA: Diagnosis not present

## 2015-01-17 DIAGNOSIS — E119 Type 2 diabetes mellitus without complications: Secondary | ICD-10-CM | POA: Diagnosis not present

## 2015-01-17 DIAGNOSIS — E785 Hyperlipidemia, unspecified: Secondary | ICD-10-CM | POA: Diagnosis not present

## 2015-01-29 ENCOUNTER — Other Ambulatory Visit: Payer: Self-pay | Admitting: Oncology

## 2015-01-29 DIAGNOSIS — D0511 Intraductal carcinoma in situ of right breast: Secondary | ICD-10-CM

## 2015-02-02 ENCOUNTER — Other Ambulatory Visit: Payer: Self-pay

## 2015-02-02 ENCOUNTER — Ambulatory Visit: Payer: Medicare Other | Admitting: Oncology

## 2015-02-02 ENCOUNTER — Other Ambulatory Visit: Payer: Medicare Other

## 2015-02-02 NOTE — Progress Notes (Signed)
Ms. Ericsson FTA today.  Sent POF to scheduling to r/s appointment in June with Dr. Marko Plume as her schedule is booked for May

## 2015-02-03 ENCOUNTER — Telehealth: Payer: Self-pay | Admitting: Oncology

## 2015-02-03 NOTE — Telephone Encounter (Signed)
Called and left a message with a new appointment °

## 2015-02-06 ENCOUNTER — Telehealth: Payer: Self-pay | Admitting: Oncology

## 2015-02-06 NOTE — Telephone Encounter (Signed)
Returned patients call as she request to reschedule  Meagan Wallace

## 2015-02-11 DIAGNOSIS — S70361A Insect bite (nonvenomous), right thigh, initial encounter: Secondary | ICD-10-CM | POA: Diagnosis not present

## 2015-02-11 DIAGNOSIS — R600 Localized edema: Secondary | ICD-10-CM | POA: Diagnosis not present

## 2015-02-11 DIAGNOSIS — R635 Abnormal weight gain: Secondary | ICD-10-CM | POA: Diagnosis not present

## 2015-02-11 DIAGNOSIS — R0602 Shortness of breath: Secondary | ICD-10-CM | POA: Diagnosis not present

## 2015-02-17 DIAGNOSIS — R600 Localized edema: Secondary | ICD-10-CM | POA: Diagnosis not present

## 2015-02-17 DIAGNOSIS — S70361A Insect bite (nonvenomous), right thigh, initial encounter: Secondary | ICD-10-CM | POA: Diagnosis not present

## 2015-03-10 ENCOUNTER — Other Ambulatory Visit: Payer: Self-pay | Admitting: Oncology

## 2015-03-10 ENCOUNTER — Telehealth: Payer: Self-pay | Admitting: Oncology

## 2015-03-10 NOTE — Telephone Encounter (Signed)
Called patient per pof and rescheduled her appointment for a chemo patient,

## 2015-03-15 ENCOUNTER — Other Ambulatory Visit: Payer: Self-pay | Admitting: *Deleted

## 2015-03-15 DIAGNOSIS — D0511 Intraductal carcinoma in situ of right breast: Secondary | ICD-10-CM

## 2015-03-15 MED ORDER — TAMOXIFEN CITRATE 20 MG PO TABS: 20.0000 mg | ORAL_TABLET | Freq: Every day | ORAL | Status: DC

## 2015-03-20 ENCOUNTER — Other Ambulatory Visit: Payer: Medicare Other

## 2015-03-20 ENCOUNTER — Ambulatory Visit: Payer: Medicare Other | Admitting: Oncology

## 2015-03-22 ENCOUNTER — Telehealth: Payer: Self-pay | Admitting: Oncology

## 2015-03-22 NOTE — Telephone Encounter (Signed)
Returned Advertising account executive. Left message to confirm appoitnemtn for June.

## 2015-03-27 ENCOUNTER — Ambulatory Visit: Payer: Medicare Other | Admitting: Oncology

## 2015-03-27 ENCOUNTER — Other Ambulatory Visit: Payer: Medicare Other

## 2015-04-03 ENCOUNTER — Ambulatory Visit (HOSPITAL_BASED_OUTPATIENT_CLINIC_OR_DEPARTMENT_OTHER): Payer: Medicare Other | Admitting: Oncology

## 2015-04-03 ENCOUNTER — Encounter: Payer: Self-pay | Admitting: Oncology

## 2015-04-03 ENCOUNTER — Telehealth: Payer: Self-pay | Admitting: Oncology

## 2015-04-03 ENCOUNTER — Other Ambulatory Visit (HOSPITAL_BASED_OUTPATIENT_CLINIC_OR_DEPARTMENT_OTHER): Payer: Medicare Other

## 2015-04-03 VITALS — BP 139/88 | HR 91 | Temp 98.1°F | Resp 19 | Ht 65.0 in | Wt 262.8 lb

## 2015-04-03 DIAGNOSIS — I10 Essential (primary) hypertension: Secondary | ICD-10-CM | POA: Diagnosis not present

## 2015-04-03 DIAGNOSIS — E119 Type 2 diabetes mellitus without complications: Secondary | ICD-10-CM

## 2015-04-03 DIAGNOSIS — Z170 Estrogen receptor positive status [ER+]: Secondary | ICD-10-CM

## 2015-04-03 DIAGNOSIS — D0511 Intraductal carcinoma in situ of right breast: Secondary | ICD-10-CM | POA: Diagnosis not present

## 2015-04-03 DIAGNOSIS — M8580 Other specified disorders of bone density and structure, unspecified site: Secondary | ICD-10-CM

## 2015-04-03 DIAGNOSIS — Z79810 Long term (current) use of selective estrogen receptor modulators (SERMs): Secondary | ICD-10-CM | POA: Diagnosis not present

## 2015-04-03 DIAGNOSIS — C50211 Malignant neoplasm of upper-inner quadrant of right female breast: Secondary | ICD-10-CM

## 2015-04-03 LAB — CBC WITH DIFFERENTIAL/PLATELET
BASO%: 0.6 % (ref 0.0–2.0)
Basophils Absolute: 0.1 10*3/uL (ref 0.0–0.1)
EOS%: 1.6 % (ref 0.0–7.0)
Eosinophils Absolute: 0.1 10*3/uL (ref 0.0–0.5)
HEMATOCRIT: 40.7 % (ref 34.8–46.6)
HEMOGLOBIN: 13.3 g/dL (ref 11.6–15.9)
LYMPH%: 16.5 % (ref 14.0–49.7)
MCH: 27.8 pg (ref 25.1–34.0)
MCHC: 32.7 g/dL (ref 31.5–36.0)
MCV: 85.1 fL (ref 79.5–101.0)
MONO#: 0.7 10*3/uL (ref 0.1–0.9)
MONO%: 8.1 % (ref 0.0–14.0)
NEUT%: 73.2 % (ref 38.4–76.8)
NEUTROS ABS: 6 10*3/uL (ref 1.5–6.5)
PLATELETS: 189 10*3/uL (ref 145–400)
RBC: 4.78 10*6/uL (ref 3.70–5.45)
RDW: 13.5 % (ref 11.2–14.5)
WBC: 8.1 10*3/uL (ref 3.9–10.3)
lymph#: 1.3 10*3/uL (ref 0.9–3.3)

## 2015-04-03 LAB — COMPREHENSIVE METABOLIC PANEL (CC13)
ALT: 32 U/L (ref 0–55)
AST: 33 U/L (ref 5–34)
Albumin: 3.2 g/dL — ABNORMAL LOW (ref 3.5–5.0)
Alkaline Phosphatase: 68 U/L (ref 40–150)
Anion Gap: 9 mEq/L (ref 3–11)
BUN: 13.7 mg/dL (ref 7.0–26.0)
CHLORIDE: 104 meq/L (ref 98–109)
CO2: 30 meq/L — AB (ref 22–29)
Calcium: 9.3 mg/dL (ref 8.4–10.4)
Creatinine: 0.7 mg/dL (ref 0.6–1.1)
EGFR: 87 mL/min/{1.73_m2} — ABNORMAL LOW (ref >90)
Glucose: 116 mg/dl (ref 70–140)
POTASSIUM: 4.2 meq/L (ref 3.5–5.1)
Sodium: 142 mEq/L (ref 136–145)
TOTAL PROTEIN: 6.8 g/dL (ref 6.4–8.3)
Total Bilirubin: 0.83 mg/dL (ref 0.20–1.20)

## 2015-04-03 NOTE — Progress Notes (Signed)
OFFICE PROGRESS NOTE   04/03/2015   Physicians:PCP: Charlynn Court, NP /Dr Reinaldo Meeker, [place] and [place] [phone]  GYN: A.LongvilleWallmeyer SU: Thomas Cornett  OTHER WE:[id number] Dian Situ (cardiology, [place]), Letta Moynahan (psych))  INTERVAL HISTORY:   Patient is seen, alone for visit, in follow up of DCIS right breast, on tamoxifen in preventative attempt since 08-23-14. Indicates that she had a recent mammogram at Dr. Harrington Challenger' office which she reports as normal.   She has not had any increased hot flashes on tamoxifen, and is otherwise tolerating that medication well. Due to see surgery in the next few weeks.   She is up to date on gyn exams, most recently by Dr Harrington Challenger early fall 2016.   No central catheter Flu vaccine done  ONCOLOGIC HISTORY 69 yo patient found to have abnormality in right breast on screening mammograms done in [place] (report not available in this EMR that I can locate). She had stereotactic biopsy 11-11-2013 at [facility] of 1.9 cm mass at 1200, path (S-350-15) DCIS with calcifications, high grade, reportedly ER PR negative. She had bilateral MRI breast in [place] system 12-02-13 with 2.6 x 2.1 x 1.9 cm area at 1200 and other scattered areas of enhancement in breasts bilaterally. She had MRI guided biopsy of right lower outer quadrant breast with benign fibroadenoma with calcifications, no atypia or malignancy ([id number]). She had right breast radioactive seed localization lumpectomy by Dr Brantley Stage on 12-27-13, confirmed by radiograph of the specimen. Pathology [id number]) from the lumpectomy had scattered microscopic foci of intermediate grade DCIS focally < 1 mm to anterior margin, ER strongly positive at 90%, PR negative, and atypical lobular hyperplasia also noted. Pathologist reviewed initial core biopsy, which was of higher grade and ER PR negative. Radiation oncology and surgery did not recommend further excision. Patient  received radiation by Dr Pablo Ledger from 02-02-14 thru 03-17-14, 50 Gy to right breast with 10 Gy boost; she had follow up with Dr Pablo Ledger on 04-28-14 and will be seen back at that office prn.  Note that at the time of her initial consultation with Dr Humphrey Rolls, the DCIS appeared hormone receptor negative. She was weaned down and off zoloft around time of starting tamoxifen in 08-2014.  Review of systems as above, also: No fever or symptoms of infection. No changes that she can tell in breasts. No bleeding. Remainder of 10 point Review of Systems negative.  Objective:  Vital signs in last 24 hours:  BP 139/88 mmHg  Pulse 91  Temp(Src) 98.1 F (36.7 C) (Oral)  Resp 19  Ht 5\' 5"  (1.651 m)  Wt 262 lb 12.8 oz (119.205 kg)  BMI 43.73 kg/m2  SpO2 97%   Alert, oriented and appropriate. Ambulatory without difficulty.   HEENT:PERRL, sclerae not icteric. Oral mucosa moist without lesions, posterior pharynx clear.  Neck supple. No JVD.  Lymphatics:no cervical,supraclavicular, axillary adenopathy Resp: clear to auscultation bilaterally and normal percussion bilaterally Cardio: regular rate and rhythm. No gallop. OF:BPZWCHE soft, nontender, not distended, no mass or organomegaly. Normally active bowel sounds.  Musculoskeletal/ Extremities: without pitting edema, cords, tenderness PSYCH appropriate mood and affect Skin without rash, ecchymosis, petechiae Breasts: Right lumpectomy scar appears well healed without changes obvious for recurrence, otherwise no skin or nipple findings.Left breast no dominant mass, no skin or nipple findings. Axillae benign.   Lab Results: CBC normal today. BUN/Cr 13.7/0.7, glucose 116. LFTs normal.  Medications: I have reviewed the patient's current medications. Continue tamoxifen 20  mg daily.  DISCUSSION: Tolerating tamoxifen thus far. She will continue this medication.  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 Radiation therapy completed 03-17-14. Systemic hormonal blockade in preventative attempt with tamoxifen now. Due to follow-up with surgery in the next few weeks. Visit with [place] in 6 months. 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  5.obesity, BMI 43.73  6.HTN. Stable EKG at recent follow up with cardiology 7.off zoloft, on venlafaxine due to interactions with tamoxifen. Appreciate assistance from her primary provider with this. The zoloft was for psychiatric indications, not for hot flashes. 8. osteopenia by recent bone density scan at [facility] extraction done OS and pending OS   Patient comfortable with discussion and plans as above.  Time spent 20 min including >50% counseling and coordination of care  Bianna Haran, Erasmo Downer, NP   04/03/2015, 2:14 PM

## 2015-04-03 NOTE — Telephone Encounter (Signed)
Gave patient avs report and appointments for December  °

## 2015-04-10 ENCOUNTER — Other Ambulatory Visit: Payer: Self-pay

## 2015-04-13 DIAGNOSIS — J441 Chronic obstructive pulmonary disease with (acute) exacerbation: Secondary | ICD-10-CM | POA: Diagnosis not present

## 2015-04-13 DIAGNOSIS — Z6841 Body Mass Index (BMI) 40.0 and over, adult: Secondary | ICD-10-CM | POA: Diagnosis not present

## 2015-04-13 DIAGNOSIS — C50919 Malignant neoplasm of unspecified site of unspecified female breast: Secondary | ICD-10-CM | POA: Diagnosis not present

## 2015-04-20 DIAGNOSIS — Z86000 Personal history of in-situ neoplasm of breast: Secondary | ICD-10-CM | POA: Diagnosis not present

## 2015-04-22 DIAGNOSIS — R0600 Dyspnea, unspecified: Secondary | ICD-10-CM | POA: Diagnosis not present

## 2015-04-22 DIAGNOSIS — J441 Chronic obstructive pulmonary disease with (acute) exacerbation: Secondary | ICD-10-CM | POA: Diagnosis not present

## 2015-04-22 DIAGNOSIS — Z6841 Body Mass Index (BMI) 40.0 and over, adult: Secondary | ICD-10-CM | POA: Diagnosis not present

## 2015-04-24 ENCOUNTER — Other Ambulatory Visit: Payer: Self-pay | Admitting: Nurse Practitioner

## 2015-04-24 DIAGNOSIS — R921 Mammographic calcification found on diagnostic imaging of breast: Secondary | ICD-10-CM

## 2015-04-27 DIAGNOSIS — C50919 Malignant neoplasm of unspecified site of unspecified female breast: Secondary | ICD-10-CM | POA: Diagnosis not present

## 2015-04-27 DIAGNOSIS — E119 Type 2 diabetes mellitus without complications: Secondary | ICD-10-CM | POA: Diagnosis not present

## 2015-04-27 DIAGNOSIS — J45909 Unspecified asthma, uncomplicated: Secondary | ICD-10-CM | POA: Diagnosis not present

## 2015-04-27 DIAGNOSIS — R600 Localized edema: Secondary | ICD-10-CM | POA: Diagnosis not present

## 2015-04-27 DIAGNOSIS — F329 Major depressive disorder, single episode, unspecified: Secondary | ICD-10-CM | POA: Diagnosis not present

## 2015-04-27 DIAGNOSIS — I10 Essential (primary) hypertension: Secondary | ICD-10-CM | POA: Diagnosis not present

## 2015-04-27 DIAGNOSIS — Z6841 Body Mass Index (BMI) 40.0 and over, adult: Secondary | ICD-10-CM | POA: Diagnosis not present

## 2015-05-09 ENCOUNTER — Ambulatory Visit
Admission: RE | Admit: 2015-05-09 | Discharge: 2015-05-09 | Disposition: A | Discharge status: Home / Self Care | Payer: Medicare Other | Source: Ambulatory Visit | Attending: Nurse Practitioner | Admitting: Nurse Practitioner

## 2015-05-09 DIAGNOSIS — Z853 Personal history of malignant neoplasm of breast: Secondary | ICD-10-CM | POA: Diagnosis not present

## 2015-05-09 DIAGNOSIS — R921 Mammographic calcification found on diagnostic imaging of breast: Secondary | ICD-10-CM | POA: Diagnosis not present

## 2015-05-19 ENCOUNTER — Other Ambulatory Visit: Payer: Self-pay | Admitting: Oncology

## 2015-05-19 DIAGNOSIS — D0510 Intraductal carcinoma in situ of unspecified breast: Secondary | ICD-10-CM

## 2015-06-11 IMAGING — MG MM RT BREAST BX W LOC DEV 1ST LESION IMAGE BX SPEC STEREO GUIDE
3 series · 3 of 3 positions shown · non-contrast
Comparison: Previous exams.

ADDENDUM:
PATHOLOGY ADDENDUM:

Pathology :
Breast, right, needle core biopsy, lower outer quadrant middle [DATE]
- HYALINIZED FIBROADENOMA WITH ASSOCIATED CALCIFICATIONS.
- NO ATYPIA OR MALIGNANCY IDENTIFIED.
Pathology concordance with imaging findings: yes
Recommendation:  Treatment plan for known right breast cancer
At the request of the patient, I spoke with her by telephone on
12/09/2013 at [DATE]. She reports doing well after the biopsy. She
does report some mild bleeding overnight, but is doing better now.
She has an appointment with Dr. Tha for surgical consultation
regarding previously diagnosed right breast ductal carcinoma in
situ.
CLINICAL DATA: Recent diagnosis of DCIS in the upper right breast,
junction of middle and posterior [DATE], on stereotactic biopsy at
[facility] on 11/11/2013, done for a mass that had no
sonographic correlate. She has an indeterminate 6 mm group of
calcifications in the lower outer right breast, middle [DATE], which
are located approximately 8-9 cm away from the original biopsy site.
EXAM:
RIGHT BREAST STEREOTACTIC CORE NEEDLE BIOPSY

[R SPECIMEN]
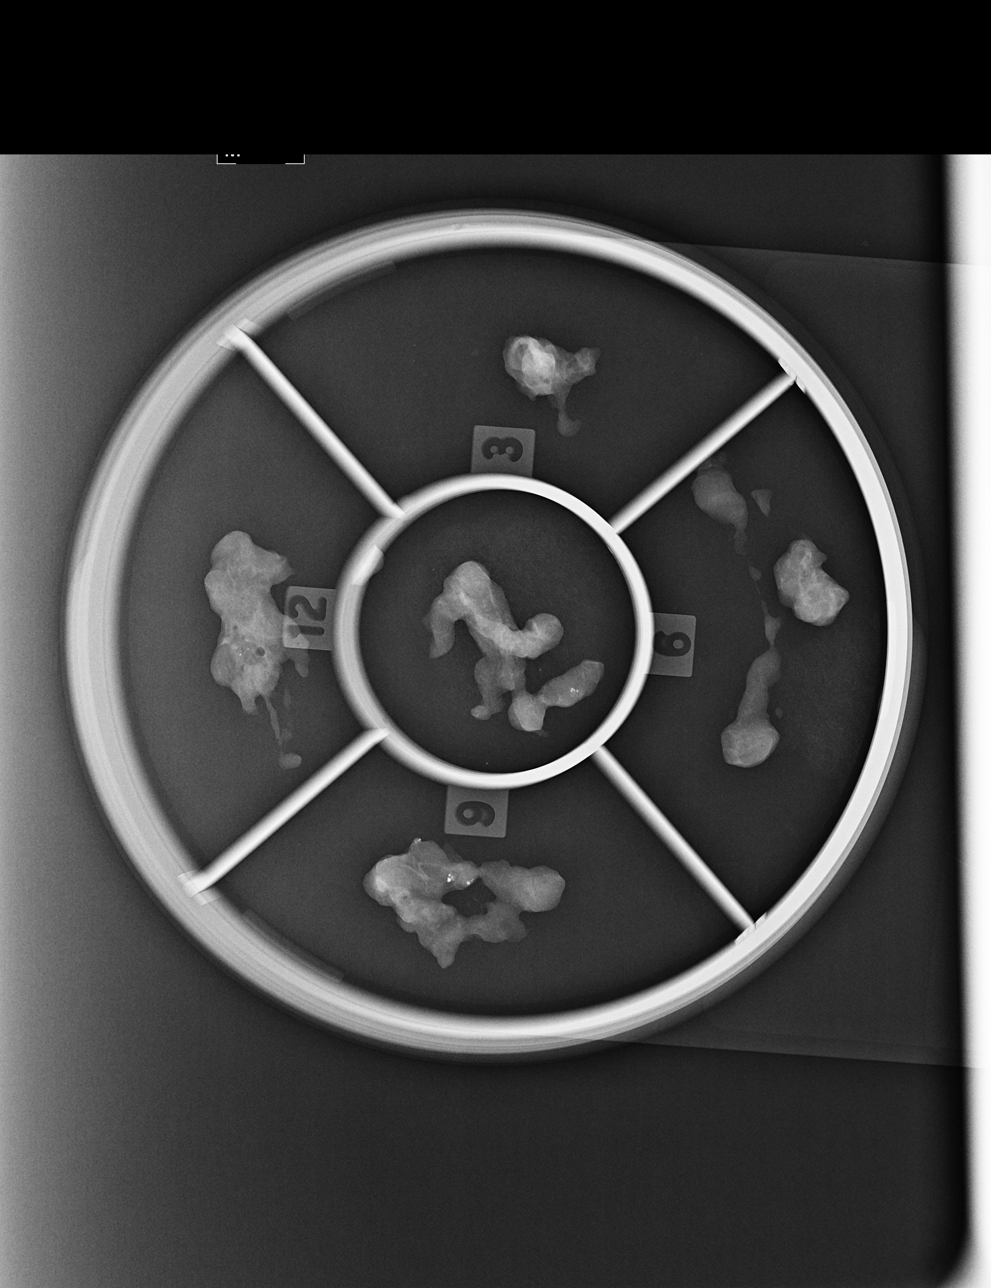

[R ML]
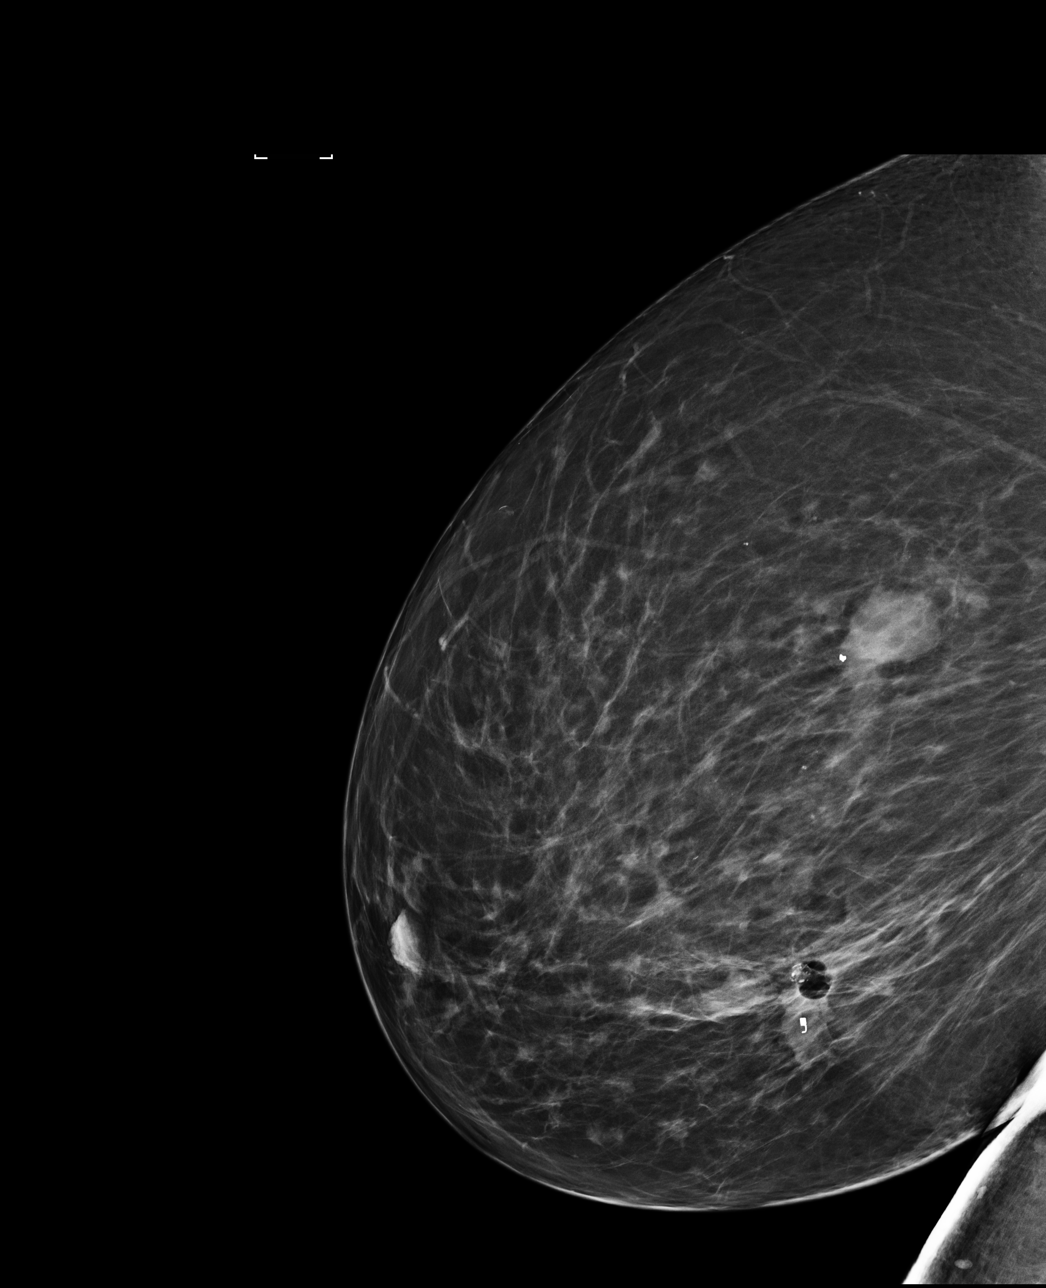

[R CC]
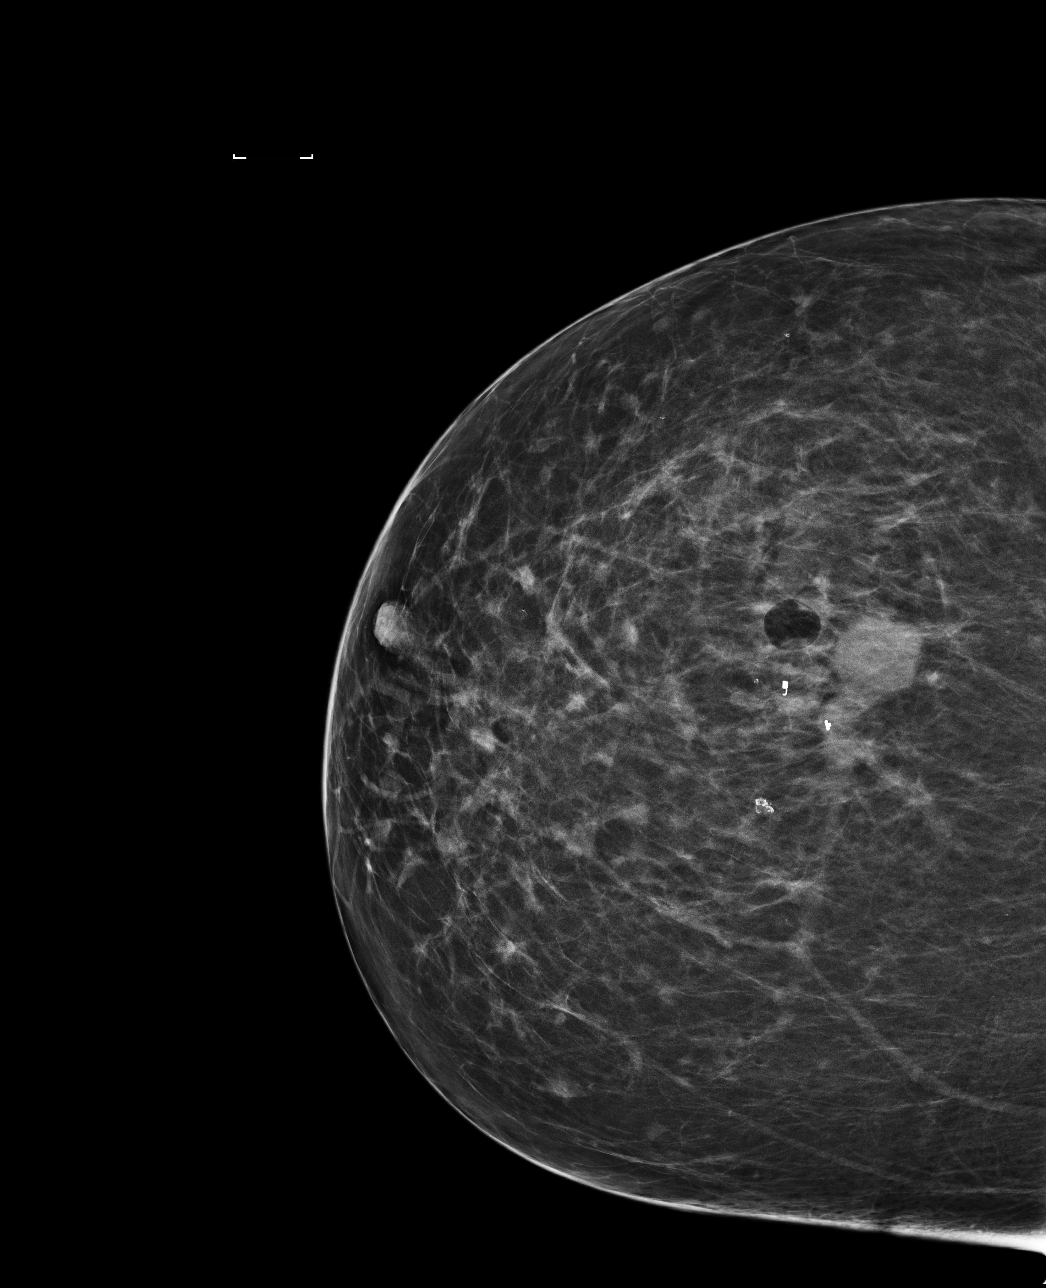

[3 of 3 positions shown; findings below may reference images not displayed]



Using sterile technique and 2% Lidocaine as local anesthetic, under
stereotactic guidance, a 9 gauge automated vacuum assisted device
was used to perform core needle biopsy of calcifications in the
lower outer right breast, middle [DATE], using a lateral approach.
Specimen radiograph was performed showing calcifications in at least
3 of the core samples. Specimens with calcifications are identified
for pathology.

At the conclusion of the procedure, a coil shaped tissue marker clip
was deployed into the biopsy cavity. Follow-up 2-view mammogram
confirmed clip placement at the site of the calcifications in the
lower outer right breast. It appears that all of the calcifications
were removed with the biopsy.
IMPRESSION: Stereotactic-guided biopsy of a 6 mm group of indeterminate
calcifications in the lower outer right breast, middle [DATE]. No
apparent complications.

Please note that the coil shaped tissue marker clip placed today is
approximately 8 cm from the top hat shaped clip placed at the time
of the biopsy on 11/11/2013.

## 2015-08-05 DIAGNOSIS — R0602 Shortness of breath: Secondary | ICD-10-CM | POA: Diagnosis not present

## 2015-08-05 DIAGNOSIS — E119 Type 2 diabetes mellitus without complications: Secondary | ICD-10-CM | POA: Diagnosis not present

## 2015-08-05 DIAGNOSIS — Z79899 Other long term (current) drug therapy: Secondary | ICD-10-CM | POA: Diagnosis not present

## 2015-08-05 DIAGNOSIS — I10 Essential (primary) hypertension: Secondary | ICD-10-CM | POA: Diagnosis not present

## 2015-08-08 DIAGNOSIS — I10 Essential (primary) hypertension: Secondary | ICD-10-CM | POA: Diagnosis not present

## 2015-08-08 DIAGNOSIS — H43399 Other vitreous opacities, unspecified eye: Secondary | ICD-10-CM | POA: Diagnosis not present

## 2015-08-08 DIAGNOSIS — M6289 Other specified disorders of muscle: Secondary | ICD-10-CM | POA: Diagnosis not present

## 2015-08-08 DIAGNOSIS — Z6841 Body Mass Index (BMI) 40.0 and over, adult: Secondary | ICD-10-CM | POA: Diagnosis not present

## 2015-08-08 DIAGNOSIS — R200 Anesthesia of skin: Secondary | ICD-10-CM | POA: Diagnosis not present

## 2015-08-10 DIAGNOSIS — Z961 Presence of intraocular lens: Secondary | ICD-10-CM | POA: Diagnosis not present

## 2015-08-10 DIAGNOSIS — H43392 Other vitreous opacities, left eye: Secondary | ICD-10-CM | POA: Diagnosis not present

## 2015-08-11 DIAGNOSIS — R51 Headache: Secondary | ICD-10-CM | POA: Diagnosis not present

## 2015-08-11 DIAGNOSIS — H43399 Other vitreous opacities, unspecified eye: Secondary | ICD-10-CM | POA: Diagnosis not present

## 2015-08-11 DIAGNOSIS — R200 Anesthesia of skin: Secondary | ICD-10-CM | POA: Diagnosis not present

## 2015-08-11 DIAGNOSIS — M6289 Other specified disorders of muscle: Secondary | ICD-10-CM | POA: Diagnosis not present

## 2015-08-11 DIAGNOSIS — I10 Essential (primary) hypertension: Secondary | ICD-10-CM | POA: Diagnosis not present

## 2015-08-17 DIAGNOSIS — C50919 Malignant neoplasm of unspecified site of unspecified female breast: Secondary | ICD-10-CM | POA: Diagnosis not present

## 2015-08-17 DIAGNOSIS — F329 Major depressive disorder, single episode, unspecified: Secondary | ICD-10-CM | POA: Diagnosis not present

## 2015-08-17 DIAGNOSIS — Z23 Encounter for immunization: Secondary | ICD-10-CM | POA: Diagnosis not present

## 2015-08-17 DIAGNOSIS — E785 Hyperlipidemia, unspecified: Secondary | ICD-10-CM | POA: Diagnosis not present

## 2015-08-17 DIAGNOSIS — Z6841 Body Mass Index (BMI) 40.0 and over, adult: Secondary | ICD-10-CM | POA: Diagnosis not present

## 2015-08-17 DIAGNOSIS — J45909 Unspecified asthma, uncomplicated: Secondary | ICD-10-CM | POA: Diagnosis not present

## 2015-08-17 DIAGNOSIS — E119 Type 2 diabetes mellitus without complications: Secondary | ICD-10-CM | POA: Diagnosis not present

## 2015-08-17 DIAGNOSIS — I10 Essential (primary) hypertension: Secondary | ICD-10-CM | POA: Diagnosis not present

## 2015-09-12 DIAGNOSIS — J441 Chronic obstructive pulmonary disease with (acute) exacerbation: Secondary | ICD-10-CM | POA: Diagnosis not present

## 2015-09-12 DIAGNOSIS — Z6841 Body Mass Index (BMI) 40.0 and over, adult: Secondary | ICD-10-CM | POA: Diagnosis not present

## 2015-09-15 DIAGNOSIS — Z6841 Body Mass Index (BMI) 40.0 and over, adult: Secondary | ICD-10-CM | POA: Diagnosis not present

## 2015-09-15 DIAGNOSIS — J209 Acute bronchitis, unspecified: Secondary | ICD-10-CM | POA: Diagnosis not present

## 2015-09-15 DIAGNOSIS — I10 Essential (primary) hypertension: Secondary | ICD-10-CM | POA: Diagnosis not present

## 2015-09-16 DIAGNOSIS — J209 Acute bronchitis, unspecified: Secondary | ICD-10-CM | POA: Diagnosis not present

## 2015-09-16 DIAGNOSIS — I10 Essential (primary) hypertension: Secondary | ICD-10-CM | POA: Diagnosis not present

## 2015-09-16 DIAGNOSIS — Z6841 Body Mass Index (BMI) 40.0 and over, adult: Secondary | ICD-10-CM | POA: Diagnosis not present

## 2015-09-17 DIAGNOSIS — Z6841 Body Mass Index (BMI) 40.0 and over, adult: Secondary | ICD-10-CM | POA: Diagnosis not present

## 2015-09-17 DIAGNOSIS — I10 Essential (primary) hypertension: Secondary | ICD-10-CM | POA: Diagnosis not present

## 2015-09-17 DIAGNOSIS — J209 Acute bronchitis, unspecified: Secondary | ICD-10-CM | POA: Diagnosis not present

## 2015-10-01 ENCOUNTER — Other Ambulatory Visit: Payer: Self-pay | Admitting: Oncology

## 2015-10-01 DIAGNOSIS — D0511 Intraductal carcinoma in situ of right breast: Secondary | ICD-10-CM

## 2015-10-01 DIAGNOSIS — Z1239 Encounter for other screening for malignant neoplasm of breast: Secondary | ICD-10-CM

## 2015-10-02 ENCOUNTER — Other Ambulatory Visit: Payer: Medicare Other

## 2015-10-02 ENCOUNTER — Ambulatory Visit: Payer: Medicare Other | Admitting: Oncology

## 2015-11-09 DIAGNOSIS — Z6841 Body Mass Index (BMI) 40.0 and over, adult: Secondary | ICD-10-CM | POA: Diagnosis not present

## 2015-11-09 DIAGNOSIS — B372 Candidiasis of skin and nail: Secondary | ICD-10-CM | POA: Diagnosis not present

## 2015-11-10 ENCOUNTER — Other Ambulatory Visit: Payer: Self-pay | Admitting: Oncology

## 2015-11-10 DIAGNOSIS — Z1239 Encounter for other screening for malignant neoplasm of breast: Secondary | ICD-10-CM

## 2015-11-10 DIAGNOSIS — D0511 Intraductal carcinoma in situ of right breast: Secondary | ICD-10-CM

## 2015-11-14 ENCOUNTER — Ambulatory Visit
Admission: RE | Admit: 2015-11-14 | Discharge: 2015-11-14 | Disposition: A | Discharge status: Home / Self Care | Payer: Medicare Other | Source: Ambulatory Visit | Attending: Oncology | Admitting: Oncology

## 2015-11-14 DIAGNOSIS — R928 Other abnormal and inconclusive findings on diagnostic imaging of breast: Secondary | ICD-10-CM | POA: Diagnosis not present

## 2015-11-14 DIAGNOSIS — Z1239 Encounter for other screening for malignant neoplasm of breast: Secondary | ICD-10-CM

## 2015-11-14 DIAGNOSIS — D0511 Intraductal carcinoma in situ of right breast: Secondary | ICD-10-CM

## 2015-11-23 DIAGNOSIS — N390 Urinary tract infection, site not specified: Secondary | ICD-10-CM | POA: Diagnosis not present

## 2015-11-23 DIAGNOSIS — I10 Essential (primary) hypertension: Secondary | ICD-10-CM | POA: Diagnosis not present

## 2015-11-23 DIAGNOSIS — R7989 Other specified abnormal findings of blood chemistry: Secondary | ICD-10-CM | POA: Diagnosis not present

## 2015-11-23 DIAGNOSIS — B372 Candidiasis of skin and nail: Secondary | ICD-10-CM | POA: Diagnosis not present

## 2015-11-23 DIAGNOSIS — Z79899 Other long term (current) drug therapy: Secondary | ICD-10-CM | POA: Diagnosis not present

## 2015-11-23 DIAGNOSIS — R29898 Other symptoms and signs involving the musculoskeletal system: Secondary | ICD-10-CM | POA: Diagnosis not present

## 2015-11-23 DIAGNOSIS — J4530 Mild persistent asthma, uncomplicated: Secondary | ICD-10-CM | POA: Diagnosis not present

## 2015-11-23 DIAGNOSIS — F341 Dysthymic disorder: Secondary | ICD-10-CM | POA: Diagnosis not present

## 2015-11-23 DIAGNOSIS — Z6841 Body Mass Index (BMI) 40.0 and over, adult: Secondary | ICD-10-CM | POA: Diagnosis not present

## 2015-11-23 DIAGNOSIS — E119 Type 2 diabetes mellitus without complications: Secondary | ICD-10-CM | POA: Diagnosis not present

## 2015-11-23 DIAGNOSIS — Z1159 Encounter for screening for other viral diseases: Secondary | ICD-10-CM | POA: Diagnosis not present

## 2015-12-19 ENCOUNTER — Telehealth: Payer: Self-pay

## 2015-12-19 DIAGNOSIS — D0511 Intraductal carcinoma in situ of right breast: Secondary | ICD-10-CM

## 2015-12-19 MED ORDER — TAMOXIFEN CITRATE 20 MG PO TABS: 20.0000 mg | ORAL_TABLET | Freq: Every day | ORAL | Status: DC

## 2015-12-19 NOTE — Telephone Encounter (Signed)
Spoke with Meagan Wallace and told her that she did not keep appointment with Dr. Marko Plume in 12-16.  Patient stated that she was not aware of appointment. Scheduled her for 01-18-16 with Dr. Marko Plume.  Will refill Tamoxifen x1 to get her to 01-18-16 appointment.  Patient verbalized understanding.

## 2016-01-14 ENCOUNTER — Other Ambulatory Visit: Payer: Self-pay | Admitting: Oncology

## 2016-01-18 ENCOUNTER — Telehealth: Payer: Self-pay | Admitting: Oncology

## 2016-01-18 ENCOUNTER — Other Ambulatory Visit: Payer: Medicare Other

## 2016-01-18 ENCOUNTER — Ambulatory Visit: Payer: Medicare Other | Admitting: Oncology

## 2016-01-18 NOTE — Telephone Encounter (Signed)
Patient showed up for lab/fu with small child. Spoke with nurse supervisor and due to current restrictions re children 12 and under being in the facility lab/fu rescheduled for 4/10 @ 4 pm. Spoke with patient and provided new schedule. Appointments rescheduled by MDale.

## 2016-01-22 ENCOUNTER — Other Ambulatory Visit (HOSPITAL_BASED_OUTPATIENT_CLINIC_OR_DEPARTMENT_OTHER): Payer: Medicare Other

## 2016-01-22 ENCOUNTER — Ambulatory Visit (HOSPITAL_BASED_OUTPATIENT_CLINIC_OR_DEPARTMENT_OTHER): Payer: Medicare Other | Admitting: Oncology

## 2016-01-22 ENCOUNTER — Encounter: Payer: Self-pay | Admitting: Oncology

## 2016-01-22 VITALS — BP 158/89 | HR 99 | Temp 98.4°F | Resp 20 | Ht 65.0 in | Wt 261.5 lb

## 2016-01-22 DIAGNOSIS — I10 Essential (primary) hypertension: Secondary | ICD-10-CM

## 2016-01-22 DIAGNOSIS — C50211 Malignant neoplasm of upper-inner quadrant of right female breast: Secondary | ICD-10-CM

## 2016-01-22 DIAGNOSIS — Z79810 Long term (current) use of selective estrogen receptor modulators (SERMs): Secondary | ICD-10-CM

## 2016-01-22 DIAGNOSIS — Z79899 Other long term (current) drug therapy: Secondary | ICD-10-CM

## 2016-01-22 DIAGNOSIS — M8580 Other specified disorders of bone density and structure, unspecified site: Secondary | ICD-10-CM | POA: Diagnosis not present

## 2016-01-22 DIAGNOSIS — E119 Type 2 diabetes mellitus without complications: Secondary | ICD-10-CM

## 2016-01-22 DIAGNOSIS — D0511 Intraductal carcinoma in situ of right breast: Secondary | ICD-10-CM | POA: Diagnosis not present

## 2016-01-22 LAB — CBC WITH DIFFERENTIAL/PLATELET
BASO%: 0.6 % (ref 0.0–2.0)
Basophils Absolute: 0.1 10*3/uL (ref 0.0–0.1)
EOS ABS: 0.1 10*3/uL (ref 0.0–0.5)
EOS%: 1.2 % (ref 0.0–7.0)
HCT: 40.2 % (ref 34.8–46.6)
HEMOGLOBIN: 13.2 g/dL (ref 11.6–15.9)
LYMPH%: 17.8 % (ref 14.0–49.7)
MCH: 28.2 pg (ref 25.1–34.0)
MCHC: 32.8 g/dL (ref 31.5–36.0)
MCV: 85.9 fL (ref 79.5–101.0)
MONO#: 1 10*3/uL — ABNORMAL HIGH (ref 0.1–0.9)
MONO%: 11.4 % (ref 0.0–14.0)
NEUT%: 69 % (ref 38.4–76.8)
NEUTROS ABS: 5.8 10*3/uL (ref 1.5–6.5)
Platelets: 200 10*3/uL (ref 145–400)
RBC: 4.68 10*6/uL (ref 3.70–5.45)
RDW: 13.5 % (ref 11.2–14.5)
WBC: 8.5 10*3/uL (ref 3.9–10.3)
lymph#: 1.5 10*3/uL (ref 0.9–3.3)
nRBC: 0 % (ref 0–0)

## 2016-01-22 LAB — COMPREHENSIVE METABOLIC PANEL
ALBUMIN: 2.9 g/dL — AB (ref 3.5–5.0)
ALT: 36 U/L (ref 0–55)
AST: 57 U/L — AB (ref 5–34)
Alkaline Phosphatase: 81 U/L (ref 40–150)
Anion Gap: 8 mEq/L (ref 3–11)
BUN: 13.5 mg/dL (ref 7.0–26.0)
CHLORIDE: 108 meq/L (ref 98–109)
CO2: 27 mEq/L (ref 22–29)
CREATININE: 0.8 mg/dL (ref 0.6–1.1)
Calcium: 9.3 mg/dL (ref 8.4–10.4)
EGFR: 73 mL/min/{1.73_m2} — ABNORMAL LOW (ref >90)
Glucose: 98 mg/dl (ref 70–140)
Potassium: 3.7 mEq/L (ref 3.5–5.1)
Sodium: 143 mEq/L (ref 136–145)
Total Bilirubin: 0.75 mg/dL (ref 0.20–1.20)
Total Protein: 6.7 g/dL (ref 6.4–8.3)

## 2016-01-22 NOTE — Progress Notes (Signed)
OFFICE PROGRESS NOTE   January 22, 2016   Physicians: PCP: Charlynn Court, NP /Dr Reinaldo Meeker, [place] and [place] [phone] GYN: A.[place] GreenWallmeyer SU: Thomas Cornett  OTHER IE:PPIRJ Dian Situ (cardiology, [place]), Letta Moynahan (psych))  INTERVAL HISTORY:   Patient is seen, alone for visit, in scheduled follow up of DCIS right breast for which she has been on tamoxifen in preventative attempt since 08-23-14. Most recent bilateral tomo mammograms were at [facility] 11-14-15, with no mammographic findings of concern.  She saw Dr Brantley Stage in 10-2015, follow up 1 year per patient. Patient is not sure when she is due for next gyn exam.   Patient has no complaints that seem referable to the DCIS history or ongoing tamoxifen. She had recent respiratory infection, resolved. She denies changes in breasts, increased SOB, GI changes, abdominal or pelvic discomfort, any bleeding, LE swelling, symptoms of blood clots. Appetite good. No new or different pain.   She has been under a great deal of stress as husband had CVA 3 weeks ago.  Remainder of 10 point Review of Systems negative / unchanged.   67 yo grandson will go to preK in fall.  ONCOLOGIC HISTORY 70 yo patient found to have abnormality in right breast on screening mammograms done in [place] (report not available in this EMR that I can locate). She had stereotactic biopsy 11-11-2013 at [facility] of 1.9 cm mass at 1200, path ([id number]) DCIS with calcifications, high grade, reportedly ER PR negative. She had bilateral MRI breast in [place] system 12-02-13 with 2.6 x 2.1 x 1.9 cm area at 1200 and other scattered areas of enhancement in breasts bilaterally. She had MRI guided biopsy of right lower outer quadrant breast with benign fibroadenoma with calcifications, no atypia or malignancy ([id number]). She had right breast radioactive seed localization lumpectomy by Dr Brantley Stage on 12-27-13, confirmed by  radiograph of the specimen. Pathology [id number]) from the lumpectomy had scattered microscopic foci of intermediate grade DCIS focally < 1 mm to anterior margin, ER strongly positive at 90%, PR negative, and atypical lobular hyperplasia also noted. Pathologist reviewed initial core biopsy, which was of higher grade and ER PR negative. Radiation oncology and surgery did not recommend further excision. Patient received radiation by Dr Pablo Ledger from 02-02-14 thru 03-17-14, 50 Gy to right breast with 10 Gy boost; she had follow up with Dr Pablo Ledger on 04-28-14 and will be seen back at that office prn.  Note that at the time of her initial consultation with Dr Humphrey Rolls, the DCIS appeared hormone receptor negative. She was weaned down and off zoloft around time of starting tamoxifen in 08-2014.    Objective:  Vital signs in last 24 hours:  BP 158/89 mmHg  Pulse 99  Temp(Src) 98.4 F (36.9 C) (Oral)  Resp 20  Ht 5' 5"  (1.651 m)  Wt 261 lb 8 oz (118.616 kg)  BMI 43.52 kg/m2  SpO2 94% Weight up 10 lbs. Looks comfortable Alert, oriented and appropriate. Ambulatory without  difficulty.  HEENT:PERRL, sclerae not icteric. Oral mucosa moist without lesions, posterior pharynx clear.   Lymphatics:no cervical,supraclavicular, axillary adenopathy Resp: clear to auscultation bilaterally and normal percussion bilaterally Cardio: regular rate and rhythm. No gallop. GI: abdomen obese, soft, nontender, cannot appreciate mass or organomegaly. Normally active bowel sounds.  Musculoskeletal/ Extremities: UE/ LE without pitting edema, cords, tenderness Neuro: no peripheral neuropathy. Otherwise nonfocal Skin cutaneous yeast rash beneath breasts, other wise no rash, ecchymosis, petechiae Breasts: right lumpectomy scar not remarkable otherwise  bilaterally without dominant mass, skin or nipple findings. Axillae benign.  Lab Results:  Results for orders placed or performed in visit on 01/22/16  CBC with  Differential/Platelet  Result Value Ref Range   WBC 8.5 3.9 - 10.3 10e3/uL   NEUT# 5.8 1.5 - 6.5 10e3/uL   HGB 13.2 11.6 - 15.9 g/dL   HCT 40.2 34.8 - 46.6 %   Platelets 200 145 - 400 10e3/uL   MCV 85.9 79.5 - 101.0 fL   MCH 28.2 25.1 - 34.0 pg   MCHC 32.8 31.5 - 36.0 g/dL   RBC 4.68 3.70 - 5.45 10e6/uL   RDW 13.5 11.2 - 14.5 %   lymph# 1.5 0.9 - 3.3 10e3/uL   MONO# 1.0 (H) 0.1 - 0.9 10e3/uL   Eosinophils Absolute 0.1 0.0 - 0.5 10e3/uL   Basophils Absolute 0.1 0.0 - 0.1 10e3/uL   NEUT% 69.0 38.4 - 76.8 %   LYMPH% 17.8 14.0 - 49.7 %   MONO% 11.4 0.0 - 14.0 %   EOS% 1.2 0.0 - 7.0 %   BASO% 0.6 0.0 - 2.0 %   nRBC 0 0 - 0 %  Comprehensive metabolic panel  Result Value Ref Range   Sodium 143 136 - 145 mEq/L   Potassium 3.7 3.5 - 5.1 mEq/L   Chloride 108 98 - 109 mEq/L   CO2 27 22 - 29 mEq/L   Glucose 98 70 - 140 mg/dl   BUN 13.5 7.0 - 26.0 mg/dL   Creatinine 0.8 0.6 - 1.1 mg/dL   Total Bilirubin 0.75 0.20 - 1.20 mg/dL   Alkaline Phosphatase 81 40 - 150 U/L   AST 57 (H) 5 - 34 U/L   ALT 36 0 - 55 U/L   Total Protein 6.7 6.4 - 8.3 g/dL   Albumin 2.9 (L) 3.5 - 5.0 g/dL   Calcium 9.3 8.4 - 10.4 mg/dL   Anion Gap 8 3 - 11 mEq/L   EGFR 73 (L) >90 ml/min/1.73 m2     Studies/Results:  EXAM: DIGITAL DIAGNOSTIC BILATERAL MAMMOGRAM WITH 3D TOMOSYNTHESIS AND CAD  ACR Breast Density Category b: There are scattered areas of fibroglandular density.  FINDINGS: There are stable postsurgical changes within the right breast. Two biopsy site markers within the right breast are stable in position. There are no new dominant masses, suspicious calcifications or secondary signs of malignancy within either breast.  Mammographic images were processed with CAD.  IMPRESSION: No evidence of malignancy within either breast. Stable postsurgical changes within the right breast.  RECOMMENDATION: Bilateral diagnostic mammogram in 1 year.  I have discussed the findings and  recommendations with the patient. Results were also provided in writing at the conclusion of the visit. If applicable, a reminder letter will be sent to the patient regarding the next appointment.  BI-RADS CATEGORY 2: Benign.    Medications: I have reviewed the patient's current medications. Continue tamoxifen, planned x 5 years thru 08-2019  DISCUSSION Interval history as above. Patient feel that she is tolerating tamoxifen without difficulty and is in agreement with continuing as planned.    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 Radiation therapy completed 03-17-14. Systemic hormonal blockade in preventative attempt with tamoxifen now. Microcalcifications at lumpectomy scar by recent mammograms, repeat imaging stable.  Dr Brantley Stage yearly in Jan. 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 gyn exams Dr Gus Height.  5.morbid obesity, BMI 43.6.  6.HTN. Stable EKG at recent follow up with cardiology 7.off zoloft, on venlafaxine due to interactions with tamoxifen, by her primary provider for psychiatric indications, not for hot flashes. 8. osteopenia by recent bone density scan at [facility] stressor with husband's CVA in 12-2015  All questions answered. Time spent 25 min including >50% counseling and coordination of care. Cc PCP, Drs Brantley Stage,  Windy Carina, MD   01/22/2016, 6:14 PM

## 2016-01-23 ENCOUNTER — Telehealth: Payer: Self-pay | Admitting: Oncology

## 2016-01-23 DIAGNOSIS — Z79899 Other long term (current) drug therapy: Secondary | ICD-10-CM | POA: Insufficient documentation

## 2016-01-23 NOTE — Telephone Encounter (Signed)
lvm for patient to inform her of fu appt with LL on Oct.

## 2016-02-09 ENCOUNTER — Other Ambulatory Visit: Payer: Self-pay

## 2016-02-09 DIAGNOSIS — D0511 Intraductal carcinoma in situ of right breast: Secondary | ICD-10-CM

## 2016-02-09 MED ORDER — TAMOXIFEN CITRATE 20 MG PO TABS: 20.0000 mg | ORAL_TABLET | Freq: Every day | ORAL | Status: DC

## 2016-03-22 DIAGNOSIS — E119 Type 2 diabetes mellitus without complications: Secondary | ICD-10-CM | POA: Diagnosis not present

## 2016-03-22 DIAGNOSIS — F341 Dysthymic disorder: Secondary | ICD-10-CM | POA: Diagnosis not present

## 2016-03-22 DIAGNOSIS — Z6841 Body Mass Index (BMI) 40.0 and over, adult: Secondary | ICD-10-CM | POA: Diagnosis not present

## 2016-03-22 DIAGNOSIS — I10 Essential (primary) hypertension: Secondary | ICD-10-CM | POA: Diagnosis not present

## 2016-03-22 DIAGNOSIS — J4530 Mild persistent asthma, uncomplicated: Secondary | ICD-10-CM | POA: Diagnosis not present

## 2016-03-22 DIAGNOSIS — E785 Hyperlipidemia, unspecified: Secondary | ICD-10-CM | POA: Diagnosis not present

## 2016-04-29 DIAGNOSIS — Z86000 Personal history of in-situ neoplasm of breast: Secondary | ICD-10-CM | POA: Diagnosis not present

## 2016-06-12 ENCOUNTER — Other Ambulatory Visit: Payer: Self-pay

## 2016-07-04 ENCOUNTER — Telehealth: Payer: Self-pay | Admitting: Oncology

## 2016-07-04 NOTE — Telephone Encounter (Signed)
Spoke with pt to confirm appt change 10/12 to 10/19 per LL

## 2016-07-10 DIAGNOSIS — Z1289 Encounter for screening for malignant neoplasm of other sites: Secondary | ICD-10-CM | POA: Diagnosis not present

## 2016-07-25 ENCOUNTER — Ambulatory Visit: Payer: Medicare Other | Admitting: Oncology

## 2016-07-25 ENCOUNTER — Other Ambulatory Visit: Payer: Medicare Other

## 2016-07-30 ENCOUNTER — Other Ambulatory Visit: Payer: Self-pay | Admitting: Oncology

## 2016-07-30 DIAGNOSIS — D0511 Intraductal carcinoma in situ of right breast: Secondary | ICD-10-CM

## 2016-08-01 ENCOUNTER — Other Ambulatory Visit (HOSPITAL_BASED_OUTPATIENT_CLINIC_OR_DEPARTMENT_OTHER): Payer: Medicare Other

## 2016-08-01 ENCOUNTER — Encounter: Payer: Self-pay | Admitting: Oncology

## 2016-08-01 ENCOUNTER — Ambulatory Visit (HOSPITAL_BASED_OUTPATIENT_CLINIC_OR_DEPARTMENT_OTHER): Payer: Medicare Other | Admitting: Oncology

## 2016-08-01 VITALS — BP 143/77 | HR 93 | Temp 98.5°F | Resp 18 | Ht 65.0 in | Wt 261.6 lb

## 2016-08-01 DIAGNOSIS — Z79810 Long term (current) use of selective estrogen receptor modulators (SERMs): Secondary | ICD-10-CM | POA: Diagnosis not present

## 2016-08-01 DIAGNOSIS — D241 Benign neoplasm of right breast: Secondary | ICD-10-CM

## 2016-08-01 DIAGNOSIS — Z1239 Encounter for other screening for malignant neoplasm of breast: Secondary | ICD-10-CM

## 2016-08-01 DIAGNOSIS — D0511 Intraductal carcinoma in situ of right breast: Secondary | ICD-10-CM

## 2016-08-01 DIAGNOSIS — Z170 Estrogen receptor positive status [ER+]: Secondary | ICD-10-CM

## 2016-08-01 DIAGNOSIS — E119 Type 2 diabetes mellitus without complications: Secondary | ICD-10-CM

## 2016-08-01 DIAGNOSIS — I10 Essential (primary) hypertension: Secondary | ICD-10-CM | POA: Diagnosis not present

## 2016-08-01 DIAGNOSIS — Z23 Encounter for immunization: Secondary | ICD-10-CM

## 2016-08-01 DIAGNOSIS — C50211 Malignant neoplasm of upper-inner quadrant of right female breast: Secondary | ICD-10-CM

## 2016-08-01 DIAGNOSIS — M8580 Other specified disorders of bone density and structure, unspecified site: Secondary | ICD-10-CM | POA: Diagnosis not present

## 2016-08-01 DIAGNOSIS — Z79899 Other long term (current) drug therapy: Secondary | ICD-10-CM

## 2016-08-01 LAB — COMPREHENSIVE METABOLIC PANEL
ALT: 22 U/L (ref 0–55)
ANION GAP: 10 meq/L (ref 3–11)
AST: 34 U/L (ref 5–34)
Albumin: 2.9 g/dL — ABNORMAL LOW (ref 3.5–5.0)
Alkaline Phosphatase: 108 U/L (ref 40–150)
BILIRUBIN TOTAL: 0.55 mg/dL (ref 0.20–1.20)
BUN: 11.9 mg/dL (ref 7.0–26.0)
CHLORIDE: 109 meq/L (ref 98–109)
CO2: 22 meq/L (ref 22–29)
Calcium: 9.1 mg/dL (ref 8.4–10.4)
Creatinine: 0.7 mg/dL (ref 0.6–1.1)
EGFR: 84 mL/min/{1.73_m2} — ABNORMAL LOW (ref >90)
Glucose: 137 mg/dl (ref 70–140)
Potassium: 4.5 mEq/L (ref 3.5–5.1)
Sodium: 141 mEq/L (ref 136–145)
TOTAL PROTEIN: 6.8 g/dL (ref 6.4–8.3)

## 2016-08-01 LAB — CBC WITH DIFFERENTIAL/PLATELET
BASO%: 0.5 % (ref 0.0–2.0)
BASOS ABS: 0.1 10*3/uL (ref 0.0–0.1)
EOS ABS: 0.2 10*3/uL (ref 0.0–0.5)
EOS%: 1.5 % (ref 0.0–7.0)
HCT: 39.7 % (ref 34.8–46.6)
HGB: 13 g/dL (ref 11.6–15.9)
LYMPH%: 14.7 % (ref 14.0–49.7)
MCH: 28.1 pg (ref 25.1–34.0)
MCHC: 32.7 g/dL (ref 31.5–36.0)
MCV: 85.9 fL (ref 79.5–101.0)
MONO#: 1 10*3/uL — ABNORMAL HIGH (ref 0.1–0.9)
MONO%: 10 % (ref 0.0–14.0)
NEUT#: 7.1 10*3/uL — ABNORMAL HIGH (ref 1.5–6.5)
NEUT%: 73.3 % (ref 38.4–76.8)
PLATELETS: 175 10*3/uL (ref 145–400)
RBC: 4.62 10*6/uL (ref 3.70–5.45)
RDW: 13.7 % (ref 11.2–14.5)
WBC: 9.7 10*3/uL (ref 3.9–10.3)
lymph#: 1.4 10*3/uL (ref 0.9–3.3)

## 2016-08-01 MED ORDER — INFLUENZA VAC SPLIT QUAD 0.5 ML IM SUSY
0.5000 mL | PREFILLED_SYRINGE | Freq: Once | INTRAMUSCULAR | Status: AC
  Administered 2016-08-01: 0.5 mL via INTRAMUSCULAR
  Filled 2016-08-01: qty 0.5

## 2016-08-01 NOTE — Progress Notes (Signed)
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 Tamoxifen was begun 08-23-14 and is planned x 5 years. Most recent bilateral tomo mammograms were at [facility] 11-14-15, with no mamographic findings of concern. She has known fibroadenoma right breast.  She had gyn exam by Dr Gus Height' partner since she was here last, per patient for next gyn exam in 2 years.  She has yearly visits with Dr Brantley Stage, next ~ 10-2016.  Patient continues to tolerate tamoxifen without obvious problems, tho some hot flashes. She is not aware of any changes in breasts bilaterally. She denies pain, respiratory symptoms, change in bowels, any bleeding, bladder symptoms, LE swelling. She has had no recent infectious illness. She is under stress at home since husband had CVAs spring 2017. Remainder of 10 point Review of Systems negative.    ONCOLOGIC HISTORY 70 yo patient found to have abnormality in right breast on screening mammograms done in [place] (report not available in this EMR that I can locate). She had stereotactic biopsy 11-11-2013 at [facility] of 1.9 cm mass at 1200, path ([id number]) DCIS with calcifications, high grade, reportedly ER PR negative. She had bilateral MRI breast in [facility] system 12-02-13 with 2.6 x 2.1 x 1.9 cm area at 1200 and other scattered areas of enhancement in breasts bilaterally. She had MRI guided biopsy of right lower outer quadrant breast with benign fibroadenoma with calcifications, no atypia or malignancy ([id number]). She had right breast radioactive seed localization lumpectomy by Dr  Brantley Stage on 12-27-13, confirmed by radiograph of the specimen. Pathology [phone]) from the lumpectomy had scattered microscopic foci of intermediate grade DCIS focally < 1 mm to anterior margin, ER strongly positive at 90%, PR negative, and atypical lobular hyperplasia also noted. Pathologist reviewed initial core biopsy, which was of higher grade and ER PR negative. Radiation oncology and surgery did not recommend further excision. Patient received radiation by Dr Pablo Ledger from 02-02-14 thru 03-17-14, 50 Gy to right breast with 10 Gy boost; she had follow up with Dr Pablo Ledger on 04-28-14 and will be seen back at that office prn.  Note that at the time of her initial consultation with Dr Humphrey Rolls, the DCIS appeared hormone receptor negative. She was weaned down and off zoloft around time of starting tamoxifen in 08-2014.  Objective:  Vital signs in last 24 hours:  BP (!) 143/77 (BP Location: Right Arm, Patient Position: Sitting)   Pulse 93   Temp 98.5 F (36.9 C) (Oral)   Resp 18   Ht _0  (1.651 m)   Wt 261 lb 9.6 oz (118.7 kg)   SpO2 96%   BMI 43.53 kg/m  Weight stable Alert, oriented and appropriate. Ambulatory without difficulty, able to get on and off exam table. No alopecia  HEENT:PERRL, sclerae not icteric. Oral mucosa moist without lesions, posterior pharynx with dull erythema consistent with post nasal drainage otherwise not remarkable. Neck supple. No JVD.  Lymphatics:no cervical,supaclavicular, axillary or inguinal adenopathy Resp: clear to auscultation bilaterally and normal percussion bilaterally Cardio: regular rate and rhythm. No gallop. GI: abdomen obese, soft, nontender, cannot appreciate mass or organomegaly. Normally active bowel sounds.  Musculoskeletal/ Extremities: without  pitting edema, cords, tenderness Neuro: no peripheral neuropathy. Otherwise nonfocal. PSYCH appropriate mood and affect Skin Candida skin rash beneath breasts bilaterally, otherwise without  ecchymosis, petechiae Breasts: Right with smooth firm area ~ 5 x 6 cm superiorly where biopsy showed fibroadenoma, otherwise without dominant mass, skin or nipple findings. Axillae benign.   Lab Results:  Results for orders placed or performed in visit on 08/01/16  CBC with Differential  Result Value Ref Range   WBC 9.7 3.9 - 10.3 10e3/uL   NEUT# 7.1 (H) 1.5 - 6.5 10e3/uL   HGB 13.0 11.6 - 15.9 g/dL   HCT 39.7 34.8 - 46.6 %   Platelets 175 145 - 400 10e3/uL   MCV 85.9 79.5 - 101.0 fL   MCH 28.1 25.1 - 34.0 pg   MCHC 32.7 31.5 - 36.0 g/dL   RBC 4.62 3.70 - 5.45 10e6/uL   RDW 13.7 11.2 - 14.5 %   lymph# 1.4 0.9 - 3.3 10e3/uL   MONO# 1.0 (H) 0.1 - 0.9 10e3/uL   Eosinophils Absolute 0.2 0.0 - 0.5 10e3/uL   Basophils Absolute 0.1 0.0 - 0.1 10e3/uL   NEUT% 73.3 38.4 - 76.8 %   LYMPH% 14.7 14.0 - 49.7 %   MONO% 10.0 0.0 - 14.0 %   EOS% 1.5 0.0 - 7.0 %   BASO% 0.5 0.0 - 2.0 %  Comprehensive metabolic panel  Result Value Ref Range   Sodium 141 136 - 145 mEq/L   Potassium 4.5 3.5 - 5.1 mEq/L   Chloride 109 98 - 109 mEq/L   CO2 22 22 - 29 mEq/L   Glucose 137 70 - 140 mg/dl   BUN 11.9 7.0 - 26.0 mg/dL   Creatinine 0.7 0.6 - 1.1 mg/dL   Total Bilirubin 0.55 0.20 - 1.20 mg/dL   Alkaline Phosphatase 108 40 - 150 U/L   AST 34 5 - 34 U/L   ALT 22 0 - 55 U/L   Total Protein 6.8 6.4 - 8.3 g/dL   Albumin 2.9 (L) 3.5 - 5.0 g/dL   Calcium 9.1 8.4 - 10.4 mg/dL   Anion Gap 10 3 - 11 mEq/L   EGFR 84 (L) >90 ml/min/1.73 m2     Studies/Results:  EXAM: DIGITAL DIAGNOSTIC BILATERAL MAMMOGRAM WITH 3D TOMOSYNTHESIS AND CAD     11-14-2015  COMPARISON:  Previous exam(s).  ACR Breast Density Category b: There are scattered areas of fibroglandular density.  FINDINGS: There are stable postsurgical changes within the right breast. Two biopsy site markers within the right breast are stable in position. There are no new dominant masses, suspicious calcifications or secondary signs of  malignancy within either breast.  Mammographic images were processed with CAD.  IMPRESSION: No evidence of malignancy within either breast. Stable postsurgical changes within the right breast.  RECOMMENDATION: Bilateral diagnostic mammogram in 1 year.  I have discussed the findings and recommendations with the patient. Results were also provided in writing at the conclusion of the visit. If applicable, a reminder letter will be sent to the patient regarding the next appointment.  BI-RADS CATEGORY  2: Benign.   Medications: I have reviewed the patient's current medications. Flu vaccine 08-01-16 Continue tamoxifen  DISCUSSION Clinically stable and tolerating tamoxifen adequately. Should have mammograms in 10-2016 and will see Dr Brantley Stage and gyn as scheduled.  As I am not going to be with [facility] after end of year, patient would like to move medical oncology care to [facility] (lives in [place]).  I spoke directly  to Mariann Laster, new patient coordinator at [facility]. They will review records and contact patient with new patient appointment there, probably for ~ 01-2017.  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 Radiation therapy completed 03-17-14. Systemic hormonal blockade in preventative attempt with tamoxifen now. Microcalcifications at lumpectomy scar by  recent mammograms, repeat imaging stable.  Dr Brantley Stage yearly in Jan. We appreciate [facility] medical oncology following from here, likely to be seen in ~ 01-2018. 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  4. gyn exams Dr Cheral Bay Ross/ associate, done ~ summer 2017.  5.morbid obesity, BMI 43.6.  6.HTN. Stable EKG at recent follow up with cardiology 7.off zoloft, on venlafaxine due to interactions with tamoxifen, by her primary provider for psychiatric indications, not for hot flashes. 8. osteopenia by bone density scan at [facility] stressor with husband's CVA in 12-2015 10.flu vaccine given 08-01-16  All questions answered. Time spent 20 min including >50% counseling and coordination of care.     Evlyn Clines, MD   08/01/2016, 3:03 PM

## 2016-08-02 ENCOUNTER — Other Ambulatory Visit: Payer: Self-pay | Admitting: Oncology

## 2016-08-02 DIAGNOSIS — Z170 Estrogen receptor positive status [ER+]: Principal | ICD-10-CM

## 2016-08-02 DIAGNOSIS — D241 Benign neoplasm of right breast: Secondary | ICD-10-CM | POA: Insufficient documentation

## 2016-08-02 DIAGNOSIS — C50211 Malignant neoplasm of upper-inner quadrant of right female breast: Secondary | ICD-10-CM

## 2016-08-07 ENCOUNTER — Telehealth: Payer: Self-pay | Admitting: Oncology

## 2016-08-07 NOTE — Telephone Encounter (Signed)
Faxed pt medical records to [facility] [phone]

## 2016-08-13 DIAGNOSIS — M8580 Other specified disorders of bone density and structure, unspecified site: Secondary | ICD-10-CM | POA: Diagnosis not present

## 2016-08-13 DIAGNOSIS — J4530 Mild persistent asthma, uncomplicated: Secondary | ICD-10-CM | POA: Diagnosis not present

## 2016-08-13 DIAGNOSIS — Z6841 Body Mass Index (BMI) 40.0 and over, adult: Secondary | ICD-10-CM | POA: Diagnosis not present

## 2016-08-13 DIAGNOSIS — E119 Type 2 diabetes mellitus without complications: Secondary | ICD-10-CM | POA: Diagnosis not present

## 2016-08-13 DIAGNOSIS — F341 Dysthymic disorder: Secondary | ICD-10-CM | POA: Diagnosis not present

## 2016-08-13 DIAGNOSIS — I10 Essential (primary) hypertension: Secondary | ICD-10-CM | POA: Diagnosis not present

## 2016-11-02 DIAGNOSIS — Z6841 Body Mass Index (BMI) 40.0 and over, adult: Secondary | ICD-10-CM | POA: Diagnosis not present

## 2016-11-02 DIAGNOSIS — L303 Infective dermatitis: Secondary | ICD-10-CM | POA: Diagnosis not present

## 2016-11-27 DIAGNOSIS — L303 Infective dermatitis: Secondary | ICD-10-CM | POA: Diagnosis not present

## 2016-11-27 DIAGNOSIS — F341 Dysthymic disorder: Secondary | ICD-10-CM | POA: Diagnosis not present

## 2016-11-27 DIAGNOSIS — L309 Dermatitis, unspecified: Secondary | ICD-10-CM | POA: Diagnosis not present

## 2016-11-27 DIAGNOSIS — I10 Essential (primary) hypertension: Secondary | ICD-10-CM | POA: Diagnosis not present

## 2016-11-27 DIAGNOSIS — Z853 Personal history of malignant neoplasm of breast: Secondary | ICD-10-CM | POA: Diagnosis not present

## 2016-11-27 DIAGNOSIS — E119 Type 2 diabetes mellitus without complications: Secondary | ICD-10-CM | POA: Diagnosis not present

## 2016-11-27 DIAGNOSIS — E785 Hyperlipidemia, unspecified: Secondary | ICD-10-CM | POA: Diagnosis not present

## 2016-11-27 DIAGNOSIS — Z9181 History of falling: Secondary | ICD-10-CM | POA: Diagnosis not present

## 2017-04-01 DIAGNOSIS — Z853 Personal history of malignant neoplasm of breast: Secondary | ICD-10-CM | POA: Diagnosis not present

## 2017-04-01 DIAGNOSIS — Z6841 Body Mass Index (BMI) 40.0 and over, adult: Secondary | ICD-10-CM | POA: Diagnosis not present

## 2017-04-01 DIAGNOSIS — M8580 Other specified disorders of bone density and structure, unspecified site: Secondary | ICD-10-CM | POA: Diagnosis not present

## 2017-04-01 DIAGNOSIS — E785 Hyperlipidemia, unspecified: Secondary | ICD-10-CM | POA: Diagnosis not present

## 2017-04-01 DIAGNOSIS — E119 Type 2 diabetes mellitus without complications: Secondary | ICD-10-CM | POA: Diagnosis not present

## 2017-04-01 DIAGNOSIS — F341 Dysthymic disorder: Secondary | ICD-10-CM | POA: Diagnosis not present

## 2017-04-01 DIAGNOSIS — J4530 Mild persistent asthma, uncomplicated: Secondary | ICD-10-CM | POA: Diagnosis not present

## 2017-04-02 ENCOUNTER — Other Ambulatory Visit: Payer: Self-pay | Admitting: Surgery

## 2017-04-02 ENCOUNTER — Other Ambulatory Visit: Payer: Self-pay | Admitting: Oncology

## 2017-04-02 DIAGNOSIS — Z170 Estrogen receptor positive status [ER+]: Principal | ICD-10-CM

## 2017-04-02 DIAGNOSIS — Z853 Personal history of malignant neoplasm of breast: Secondary | ICD-10-CM

## 2017-04-02 DIAGNOSIS — C50211 Malignant neoplasm of upper-inner quadrant of right female breast: Secondary | ICD-10-CM

## 2017-04-25 DIAGNOSIS — E119 Type 2 diabetes mellitus without complications: Secondary | ICD-10-CM | POA: Diagnosis not present

## 2017-04-25 DIAGNOSIS — H524 Presbyopia: Secondary | ICD-10-CM | POA: Diagnosis not present

## 2017-04-25 DIAGNOSIS — Z7984 Long term (current) use of oral hypoglycemic drugs: Secondary | ICD-10-CM | POA: Diagnosis not present

## 2017-04-25 DIAGNOSIS — I10 Essential (primary) hypertension: Secondary | ICD-10-CM | POA: Diagnosis not present

## 2017-04-25 DIAGNOSIS — H5203 Hypermetropia, bilateral: Secondary | ICD-10-CM | POA: Diagnosis not present

## 2017-04-25 DIAGNOSIS — Z961 Presence of intraocular lens: Secondary | ICD-10-CM | POA: Diagnosis not present

## 2017-04-25 DIAGNOSIS — H35033 Hypertensive retinopathy, bilateral: Secondary | ICD-10-CM | POA: Diagnosis not present

## 2017-04-25 DIAGNOSIS — H52223 Regular astigmatism, bilateral: Secondary | ICD-10-CM | POA: Diagnosis not present

## 2017-05-06 ENCOUNTER — Encounter (HOSPITAL_COMMUNITY): Payer: Self-pay | Admitting: Vascular Surgery

## 2017-05-06 ENCOUNTER — Ambulatory Visit (HOSPITAL_COMMUNITY)
Admission: EM | Admit: 2017-05-06 | Discharge: 2017-05-06 | Disposition: A | Discharge status: Home / Self Care | Payer: Medicare Other

## 2017-05-06 ENCOUNTER — Emergency Department (HOSPITAL_COMMUNITY): Payer: Medicare Other

## 2017-05-06 ENCOUNTER — Emergency Department (HOSPITAL_COMMUNITY)
Admission: EM | Admit: 2017-05-06 | Discharge: 2017-05-07 | Disposition: A | Discharge status: Home / Self Care | Payer: Medicare Other | Attending: Physician Assistant | Admitting: Physician Assistant

## 2017-05-06 DIAGNOSIS — W19XXXA Unspecified fall, initial encounter: Secondary | ICD-10-CM

## 2017-05-06 DIAGNOSIS — Z7982 Long term (current) use of aspirin: Secondary | ICD-10-CM | POA: Diagnosis not present

## 2017-05-06 DIAGNOSIS — Z79899 Other long term (current) drug therapy: Secondary | ICD-10-CM | POA: Diagnosis not present

## 2017-05-06 DIAGNOSIS — S6992XA Unspecified injury of left wrist, hand and finger(s), initial encounter: Secondary | ICD-10-CM | POA: Diagnosis not present

## 2017-05-06 DIAGNOSIS — M79602 Pain in left arm: Secondary | ICD-10-CM | POA: Diagnosis not present

## 2017-05-06 DIAGNOSIS — Z7984 Long term (current) use of oral hypoglycemic drugs: Secondary | ICD-10-CM | POA: Diagnosis not present

## 2017-05-06 DIAGNOSIS — Y929 Unspecified place or not applicable: Secondary | ICD-10-CM | POA: Insufficient documentation

## 2017-05-06 DIAGNOSIS — W010XXA Fall on same level from slipping, tripping and stumbling without subsequent striking against object, initial encounter: Secondary | ICD-10-CM | POA: Insufficient documentation

## 2017-05-06 DIAGNOSIS — F329 Major depressive disorder, single episode, unspecified: Secondary | ICD-10-CM | POA: Insufficient documentation

## 2017-05-06 DIAGNOSIS — M791 Myalgia: Secondary | ICD-10-CM | POA: Diagnosis not present

## 2017-05-06 DIAGNOSIS — M25532 Pain in left wrist: Secondary | ICD-10-CM | POA: Diagnosis not present

## 2017-05-06 DIAGNOSIS — J45909 Unspecified asthma, uncomplicated: Secondary | ICD-10-CM | POA: Diagnosis not present

## 2017-05-06 DIAGNOSIS — R079 Chest pain, unspecified: Secondary | ICD-10-CM | POA: Insufficient documentation

## 2017-05-06 DIAGNOSIS — Y9389 Activity, other specified: Secondary | ICD-10-CM | POA: Diagnosis not present

## 2017-05-06 DIAGNOSIS — F419 Anxiety disorder, unspecified: Secondary | ICD-10-CM | POA: Insufficient documentation

## 2017-05-06 DIAGNOSIS — E119 Type 2 diabetes mellitus without complications: Secondary | ICD-10-CM | POA: Diagnosis not present

## 2017-05-06 DIAGNOSIS — Y998 Other external cause status: Secondary | ICD-10-CM | POA: Diagnosis not present

## 2017-05-06 DIAGNOSIS — Z8673 Personal history of transient ischemic attack (TIA), and cerebral infarction without residual deficits: Secondary | ICD-10-CM | POA: Diagnosis not present

## 2017-05-06 DIAGNOSIS — Z853 Personal history of malignant neoplasm of breast: Secondary | ICD-10-CM | POA: Diagnosis not present

## 2017-05-06 DIAGNOSIS — I10 Essential (primary) hypertension: Secondary | ICD-10-CM | POA: Insufficient documentation

## 2017-05-06 LAB — CBC
HCT: 41 % (ref 36.0–46.0)
HEMOGLOBIN: 13.6 g/dL (ref 12.0–15.0)
MCH: 27.9 pg (ref 26.0–34.0)
MCHC: 33.2 g/dL (ref 30.0–36.0)
MCV: 84.2 fL (ref 78.0–100.0)
PLATELETS: 183 10*3/uL (ref 150–400)
RBC: 4.87 MIL/uL (ref 3.87–5.11)
RDW: 13.6 % (ref 11.5–15.5)
WBC: 9.9 10*3/uL (ref 4.0–10.5)

## 2017-05-06 LAB — BASIC METABOLIC PANEL
ANION GAP: 9 (ref 5–15)
BUN: 8 mg/dL (ref 6–20)
CALCIUM: 9.1 mg/dL (ref 8.9–10.3)
CHLORIDE: 101 mmol/L (ref 101–111)
CO2: 27 mmol/L (ref 22–32)
CREATININE: 0.64 mg/dL (ref 0.44–1.00)
GFR calc non Af Amer: >60 mL/min (ref >60)
Glucose, Bld: 111 mg/dL — ABNORMAL HIGH (ref 65–99)
Potassium: 3.5 mmol/L (ref 3.5–5.1)
SODIUM: 137 mmol/L (ref 135–145)

## 2017-05-06 NOTE — ED Triage Notes (Signed)
Pt reports to the ED for CP and pain following a fall. She states she believes she had a heart attack on Sunday because she was having severe left sided CP. Pt denies any CP at this time. She states she was standing on the floor reaching to try and turn off a light fixture she fell on her left side. Denies any head injury or LOC. She is complaining of left hand and left arm pain at this time. Denies any increased SOB, lightheadedness, dizziness, or N/V.

## 2017-05-06 NOTE — ED Provider Notes (Signed)
[facility] DEPT Provider Note   CSN: [id number] Arrival date & time: 05/06/17  1721     History   Chief Complaint Chief Complaint  Patient presents with  . Fall    HPI Meagan Wallace is a 71 y.o. female.  Patient presents for evaluation of chest pain and left arm pain after a fall 2 days ago. She reports she had an episode of chest pain that was sharp, radiated through to the back, and was not associated with SOB, diaphoresis or nausea/vomiting. No history of ischemic heart disease. Later in the day, after the chest pain resolved, she reached to turn off a light and fell onto the left side and reports pain from the shoulder to her wrist. She denies hitting her head. No neck pain. There is no complaint of weakness or numbness of the arm.    The history is provided by the patient. No language interpreter was used.  Fall  Associated symptoms include chest pain (See HPI.). Pertinent negatives include no abdominal pain and no shortness of breath.    Past Medical History:  Diagnosis Date  . Anemia   . Anxiety   . Arthritis   . Asthma   . Cancer ([place])    breast  . Cataract   . Depression   . Diabetes mellitus without complication ([place])   . Heart murmur   . Hypertension   . PONV (postoperative nausea and vomiting)    either  . Radiation 02/02/14-03/17/14   Right Breast  . Stroke [facility])     Patient Active Problem List   Diagnosis Date Noted  . Fibroadenoma of right breast 08/02/2016  . High risk medication use 01/23/2016  . Chronic depression 10/06/2014  . Menopausal syndrome (hot flashes) 10/06/2014  . Osteopenia determined by x-ray 10/06/2014  . History of breast cancer 04/22/2014  . Type II or unspecified type diabetes mellitus without mention of complication, not stated as uncontrolled 03/19/2014  . Obesity 03/19/2014  . Osteopenia 03/19/2014  . CVA (cerebral infarction) 03/19/2014  . HTN (hypertension) 03/19/2014  . DCIS (ductal carcinoma in situ)  12/01/2013  . Breast cancer of upper-inner quadrant of right female breast ([place]) 11/23/2013    Past Surgical History:  Procedure Laterality Date  . APPENDECTOMY  1987  . BREAST LUMPECTOMY WITH RADIOACTIVE SEED LOCALIZATION Right 12/27/2013   Procedure: BREAST LUMPECTOMY WITH RADIOACTIVE SEED LOCALIZATION;  Surgeon: Joyice Faster. Cornett, MD;  Location: [facility];  Service: General;  Laterality: Right;  . COLONOSCOPY    . EXPLORATORY LAPAROTOMY  1987  . SHOULDER SURGERY  2011   right    OB History    No data available       Home Medications    Prior to Admission medications   Medication Sig Start Date End Date Taking? Authorizing Provider  albuterol (PROVENTIL) (2.5 MG/3ML) 0.083% nebulizer solution Take 2.5 mg by nebulization every 6 (six) hours as needed for wheezing or shortness of breath.    [provider]  aluminum hydroxide-magnesium carbonate (GAVISCON) 95-358 MG/15ML SUSP Take 30 mLs by mouth as needed. Reported on 01/22/2016    [provider]  amLODipine (NORVASC) 10 MG tablet Take 10 mg by mouth daily. 12/01/13   [provider]  Ascorbic Acid (VITAMIN C PO) Take 600 mg by mouth 2 (two) times daily.     [provider]  aspirin 325 MG tablet Take 325 mg by mouth daily. 12/01/13   [provider]  Cholecalciferol (VITAMIN D PO)  Take 500 mg by mouth at bedtime. Takes total 1000mg     [provider]  diphenhydrAMINE (BENADRYL) 25 MG tablet Take 25 mg by mouth 2 (two) times daily as needed. Reported on 01/22/2016    [provider]  furosemide (LASIX) 20 MG tablet Take 20 mg by mouth daily.  01/06/16   [provider]  metFORMIN (GLUCOPHAGE) 500 MG tablet Take 500 mg by mouth 2 (two) times daily with a meal. 12/01/13   [provider]  mometasone-formoterol (DULERA) 100-5 MCG/ACT AERO Inhale 2 puffs into the lungs as needed for wheezing.    [provider]  nystatin (MYCOSTATIN)  powder  11/09/15   [provider]  potassium chloride (K-DUR) 10 MEQ tablet Take 10 mEq by mouth daily.  01/16/16   [provider]  ramipril (ALTACE) 10 MG capsule Take 10 mg by mouth 2 (two) times daily. 12/01/13   [provider]  tamoxifen (NOLVADEX) 20 MG tablet Take 1 tablet (20 mg total) by mouth daily. 02/09/16   Gordy Levan, MD  venlafaxine XR (EFFEXOR-XR) 150 MG 24 hr capsule  01/04/16   [provider]    Family History Family History  Problem Relation Age of Onset  . Heart disease Mother   . Stroke Father   . Breast cancer Paternal Aunt     Social History Social History  Substance Use Topics  . Smoking status: Never Smoker  . Smokeless tobacco: Never Used  . Alcohol use No     Allergies   Patient has no known allergies.   Review of Systems Review of Systems  Constitutional: Negative for chills, diaphoresis and fever.  Respiratory: Negative.  Negative for cough and shortness of breath.   Cardiovascular: Positive for chest pain (See HPI.).  Gastrointestinal: Negative.  Negative for abdominal pain, nausea and vomiting.  Musculoskeletal:       See HPI.  Skin: Negative.  Negative for wound.  Neurological: Negative.  Negative for weakness and numbness.     Physical Exam Updated Vital Signs BP 117/89 (BP Location: Left Arm)   Pulse 87   Temp 99.2 F (37.3 C) (Oral)   Resp 18   SpO2 94%   Physical Exam  Constitutional: She appears well-developed and well-nourished.  HENT:  Head: Normocephalic.  Neck: Normal range of motion. Neck supple.  Cardiovascular: Normal rate, regular rhythm and intact distal pulses.   Murmur (Aortic murmur) heard. Pulmonary/Chest: Effort normal and breath sounds normal.  Abdominal: Soft. Bowel sounds are normal. There is no tenderness. There is no rebound and no guarding.  Musculoskeletal: Normal range of motion.  No midline cervical tenderness. FROM neck and LUE. LUE nontender with the  exception of the dorsal wrist. Minimal swelling.   Neurological: She is alert. No cranial nerve deficit.  Skin: Skin is warm and dry. No rash noted.  Psychiatric: She has a normal mood and affect.     ED Treatments / Results  Labs (all labs ordered are listed, but only abnormal results are displayed) Labs Reviewed  BASIC METABOLIC PANEL - Abnormal; Notable for the following:       Result Value   Glucose, Bld 111 (*)    All other components within normal limits  CBC  I-STAT TROPONIN, ED   Results for orders placed or performed during the hospital encounter of 42/68/34  Basic metabolic panel  Result Value Ref Range   Sodium 137 135 - 145 mmol/L   Potassium 3.5 3.5 - 5.1 mmol/L  Chloride 101 101 - 111 mmol/L   CO2 27 22 - 32 mmol/L   Glucose, Bld 111 (H) 65 - 99 mg/dL   BUN 8 6 - 20 mg/dL   Creatinine, Ser 0.64 0.44 - 1.00 mg/dL   Calcium 9.1 8.9 - 10.3 mg/dL   GFR calc non Af Amer >60 >60 mL/min   GFR calc Af Amer >60 >60 mL/min   Anion gap 9 5 - 15  CBC  Result Value Ref Range   WBC 9.9 4.0 - 10.5 K/uL   RBC 4.87 3.87 - 5.11 MIL/uL   Hemoglobin 13.6 12.0 - 15.0 g/dL   HCT 41.0 36.0 - 46.0 %   MCV 84.2 78.0 - 100.0 fL   MCH 27.9 26.0 - 34.0 pg   MCHC 33.2 30.0 - 36.0 g/dL   RDW 13.6 11.5 - 15.5 %   Platelets 183 150 - 400 K/uL    EKG  EKG Interpretation  Date/Time:  Tuesday May 06 2017 18:09:42 EDT Ventricular Rate:  84 PR Interval:  148 QRS Duration: 76 QT Interval:  394 QTC Calculation: 465 R Axis:   -34 Text Interpretation:  Normal sinus rhythm Left axis deviation Anterior infarct , age undetermined Abnormal ECG Normal sinus rhythm Confirmed by Zenovia Jarred [phone]) on 05/06/2017 9:42:07 PM       Radiology Dg Chest 2 View  Result Date: 05/06/2017 CLINICAL DATA:  Left-sided chest pain EXAM: CHEST  2 VIEW COMPARISON:  08/05/2015 FINDINGS: The heart size and mediastinal contours are within normal limits. Both lungs are clear. Postsurgical changes of  the right humeral head. Multiple calcified loose bodies in the subacromial space on the right. IMPRESSION: No active cardiopulmonary disease. Electronically Signed   By: Donavan Foil M.D.   On: 05/06/2017 19:25   Dg Wrist Complete Left  Result Date: 05/06/2017 CLINICAL DATA:  Recent fall several days ago with wrist pain, initial encounter EXAM: LEFT WRIST - COMPLETE 3+ VIEW COMPARISON:  None. FINDINGS: There is no evidence of fracture or dislocation. There is no evidence of arthropathy or other focal bone abnormality. Soft tissues are unremarkable. IMPRESSION: No acute abnormality noted. Electronically Signed   By: Inez Catalina M.D.   On: 05/06/2017 22:52    Procedures Procedures (including critical care time)  Medications Ordered in ED Medications - No data to display   Initial Impression / Assessment and Plan / ED Course  I have reviewed the triage vital signs and the nursing notes.  Pertinent labs & imaging results that were available during my care of the patient were reviewed by me and considered in my medical decision making (see chart for details).     Patient presents for evaluation of chest pain, as well as injury from a fall, both occurring 2 days ago.   She is well appearing. Cardiac evaluation is negative. Troponin is 0.0. Exam of the musculoskeletal left arm shows mild wrist tenderness. Imaging negative.   She can be discharged home with PCP follow up as needed.  Final Clinical Impressions(s) / ED Diagnoses   Final diagnoses:  None   1. Nonspecific chest pain 2. Musculoskeletal pain, LUE 3. Fall  New Prescriptions New Prescriptions   No medications on file     Charlann Lange, Hershal Coria 05/06/17 [id number]    Macarthur Critchley, MD 05/10/17 [phone]

## 2017-05-06 NOTE — Discharge Instructions (Signed)
Continue your regular medications. Take Tylenol or ibuprofen for pain as or if needed. See your doctor for recheck if pain continues and return to the emergency department with any new or concerning symptoms.

## 2017-05-07 LAB — I-STAT TROPONIN, ED: TROPONIN I, POC: 0 ng/mL (ref 0.00–0.08)

## 2017-06-09 ENCOUNTER — Ambulatory Visit
Admission: RE | Admit: 2017-06-09 | Discharge: 2017-06-09 | Disposition: A | Discharge status: Home / Self Care | Payer: Medicare Other | Source: Ambulatory Visit | Attending: Surgery | Admitting: Surgery

## 2017-06-09 ENCOUNTER — Ambulatory Visit (HOSPITAL_BASED_OUTPATIENT_CLINIC_OR_DEPARTMENT_OTHER): Payer: Medicare Other | Admitting: Hematology and Oncology

## 2017-06-09 ENCOUNTER — Telehealth: Payer: Self-pay | Admitting: Hematology and Oncology

## 2017-06-09 ENCOUNTER — Encounter: Payer: Self-pay | Admitting: Hematology and Oncology

## 2017-06-09 DIAGNOSIS — D0511 Intraductal carcinoma in situ of right breast: Secondary | ICD-10-CM | POA: Diagnosis not present

## 2017-06-09 DIAGNOSIS — Z79810 Long term (current) use of selective estrogen receptor modulators (SERMs): Secondary | ICD-10-CM | POA: Diagnosis not present

## 2017-06-09 DIAGNOSIS — C50211 Malignant neoplasm of upper-inner quadrant of right female breast: Secondary | ICD-10-CM

## 2017-06-09 DIAGNOSIS — Z170 Estrogen receptor positive status [ER+]: Principal | ICD-10-CM

## 2017-06-09 DIAGNOSIS — E119 Type 2 diabetes mellitus without complications: Secondary | ICD-10-CM

## 2017-06-09 DIAGNOSIS — M8580 Other specified disorders of bone density and structure, unspecified site: Secondary | ICD-10-CM | POA: Diagnosis not present

## 2017-06-09 DIAGNOSIS — R928 Other abnormal and inconclusive findings on diagnostic imaging of breast: Secondary | ICD-10-CM | POA: Diagnosis not present

## 2017-06-09 HISTORY — DX: Personal history of irradiation: Z92.3

## 2017-06-09 HISTORY — DX: Malignant neoplasm of unspecified site of unspecified female breast: C50.919

## 2017-06-09 MED ORDER — TAMOXIFEN CITRATE 20 MG PO TABS: 20.0000 mg | ORAL_TABLET | Freq: Every day | ORAL | 3 refills | Status: DC

## 2017-06-09 NOTE — Telephone Encounter (Signed)
Gave patient avs report and appointments for September 2019.

## 2017-06-09 NOTE — Progress Notes (Signed)
Patient Care Team: Charlynn Court, NP as PCP - General (Nurse Practitioner)  DIAGNOSIS:  Encounter Diagnosis  Name Primary?  . Ductal carcinoma in situ (DCIS) of right breast     SUMMARY OF ONCOLOGIC HISTORY:   Ductal carcinoma in situ (DCIS) of right breast   11/11/2013 Initial Diagnosis    Ductal carcinoma in situ (DCIS) of right breast diagnosed at [facility], 1.9 cm mass at 12:00, DCIS high-grade ER/PR negative, MRI revealed 2.6 cm mass along with bilateral enhancements (fibroadenomas on biopsy)      12/27/2013 Surgery    Right lumpectomy with Dr. Brantley Stage: Intermediate grade DCIS ER 90%, PR 0%, [facility]      02/02/2014 - 03/17/2014 Radiation Therapy    Adjuvant radiation therapy by Dr. Pablo Ledger 50 Gy to right breast with 10 Gy boost      08/23/2014 -  Anti-estrogen oral therapy    Tamoxifen 20 mg daily       CHIEF COMPLIANT: Follow-up on tamoxifen therapy  INTERVAL HISTORY: Meagan Wallace is a 71 year old with above-mentioned history of right breast DCIS underwent lumpectomy and radiation is currently on tamoxifen since 2015. She is tolerating tamoxifen extremely well. She denies any hot flashes or myalgias. She denies any lumps or nodules in the breasts.  REVIEW OF SYSTEMS:   Constitutional: Denies fevers, chills or abnormal weight loss Eyes: Denies blurriness of vision Ears, nose, mouth, throat, and face: Denies mucositis or sore throat Respiratory: Denies cough, dyspnea or wheezes Cardiovascular: Denies palpitation, chest discomfort Gastrointestinal:  Denies nausea, heartburn or change in bowel habits Skin: Denies abnormal skin rashes Lymphatics: Denies new lymphadenopathy or easy bruising Neurological:Denies numbness, tingling or new weaknesses Behavioral/Psych: Mood is stable, no new changes  Extremities: No lower extremity edema Breast:  denies any pain or lumps or nodules in either breasts All other systems were reviewed with the patient and are  negative.  I have reviewed the past medical history, past surgical history, social history and family history with the patient and they are unchanged from previous note.  ALLERGIES:  has No Known Allergies.  MEDICATIONS:  Current Outpatient Prescriptions  Medication Sig Dispense Refill  . albuterol (PROVENTIL) (2.5 MG/3ML) 0.083% nebulizer solution Take 2.5 mg by nebulization every 6 (six) hours as needed for wheezing or shortness of breath.    Marland Kitchen aluminum hydroxide-magnesium carbonate (GAVISCON) 95-358 MG/15ML SUSP Take 30 mLs by mouth as needed. Reported on 01/22/2016    . amLODipine (NORVASC) 10 MG tablet Take 10 mg by mouth daily.    . Ascorbic Acid (VITAMIN C PO) Take 600 mg by mouth 2 (two) times daily.     Marland Kitchen aspirin 325 MG tablet Take 325 mg by mouth daily.    . Cholecalciferol (VITAMIN D PO) Take 500 mg by mouth at bedtime. Takes total 1000mg     . diphenhydrAMINE (BENADRYL) 25 MG tablet Take 25 mg by mouth 2 (two) times daily as needed. Reported on 01/22/2016    . furosemide (LASIX) 20 MG tablet Take 20 mg by mouth daily.     . metFORMIN (GLUCOPHAGE) 500 MG tablet Take 500 mg by mouth 2 (two) times daily with a meal.    . mometasone-formoterol (DULERA) 100-5 MCG/ACT AERO Inhale 2 puffs into the lungs as needed for wheezing.    . nystatin (MYCOSTATIN) powder     . potassium chloride (K-DUR) 10 MEQ tablet Take 10 mEq by mouth daily.     . ramipril (ALTACE) 10 MG capsule Take 10 mg by  mouth 2 (two) times daily.    . tamoxifen (NOLVADEX) 20 MG tablet Take 1 tablet (20 mg total) by mouth daily. 90 tablet 3  . venlafaxine XR (EFFEXOR-XR) 150 MG 24 hr capsule      No current facility-administered medications for this visit.     PHYSICAL EXAMINATION: ECOG PERFORMANCE STATUS: 2 - Symptomatic, <50% confined to bed  Vitals:   06/09/17 0908  BP: (!) 165/89  Pulse: 95  Resp: 18  Temp: 98.3 F (36.8 C)  SpO2: 95%   Filed Weights   06/09/17 0908  Weight: 251 lb 3.2 oz (113.9 kg)     GENERAL:alert, no distress and comfortable SKIN: skin color, texture, turgor are normal, no rashes or significant lesions EYES: normal, Conjunctiva are pink and non-injected, sclera clear OROPHARYNX:no exudate, no erythema and lips, buccal mucosa, and tongue normal  NECK: supple, thyroid normal size, non-tender, without nodularity LYMPH:  no palpable lymphadenopathy in the cervical, axillary or inguinal LUNGS: clear to auscultation and percussion with normal breathing effort HEART: regular rate & rhythm and no murmurs and no lower extremity edema ABDOMEN:abdomen soft, non-tender and normal bowel sounds MUSCULOSKELETAL:no cyanosis of digits and no clubbing  NEURO: alert & oriented x 3 with fluent speech, no focal motor/sensory deficits EXTREMITIES: No lower extremity edema BREAST: No palpable masses or nodules in either right or left breasts. No palpable axillary supraclavicular or infraclavicular adenopathy no breast tenderness or nipple discharge. (exam performed in the presence of a chaperone)  LABORATORY DATA:  I have reviewed the data as listed   Chemistry      Component Value Date/Time   NA 137 05/06/2017 1815   NA 141 08/01/2016 1318   K 3.5 05/06/2017 1815   K 4.5 08/01/2016 1318   CL 101 05/06/2017 1815   CO2 27 05/06/2017 1815   CO2 22 08/01/2016 1318   BUN 8 05/06/2017 1815   BUN 11.9 08/01/2016 1318   CREATININE 0.64 05/06/2017 1815   CREATININE 0.7 08/01/2016 1318      Component Value Date/Time   CALCIUM 9.1 05/06/2017 1815   CALCIUM 9.1 08/01/2016 1318   ALKPHOS 108 08/01/2016 1318   AST 34 08/01/2016 1318   ALT 22 08/01/2016 1318   BILITOT 0.55 08/01/2016 1318       Lab Results  Component Value Date   WBC 9.9 05/06/2017   HGB 13.6 05/06/2017   HCT 41.0 05/06/2017   MCV 84.2 05/06/2017   PLT 183 05/06/2017   NEUTROABS 7.1 (H) 08/01/2016    ASSESSMENT & PLAN:  Ductal carcinoma in situ (DCIS) of right breast Right breast DCIS intermediate grade,  ER 90%, PR 0% (on initial biopsy she was started to high-grade DCIS that was ER/PR negative); status post lumpectomy, radiation and is currently on tamoxifen since November 2015 Patient saw Dr. Humphrey Rolls and Dr. Marko Plume previously  Tamoxifen toxicities: 1. 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 I spent 25 minutes talking to the patient of which more than half was spent in counseling and coordination of care.  No orders of the defined types were placed in this encounter.  The patient has a good understanding of the overall plan. she agrees with it. she will call with any problems that  may develop before the next visit here.   Rulon Eisenmenger, MD 06/09/17

## 2017-06-09 NOTE — Assessment & Plan Note (Signed)
Right breast DCIS intermediate grade, ER 90%, PR 0% (on initial biopsy she was started to high-grade DCIS that was ER/PR negative); status post lumpectomy, radiation and is currently on tamoxifen since November 2015  Tamoxifen toxicities:  Surveillance: Mammograms done today 06/09/2017 Osteopenia Morbid obesity Diabetes History of CVA related to rheumatic fever at age 71  Return to clinic in 1 year for follow-up

## 2017-07-30 DIAGNOSIS — Z853 Personal history of malignant neoplasm of breast: Secondary | ICD-10-CM | POA: Diagnosis not present

## 2017-07-30 DIAGNOSIS — M8580 Other specified disorders of bone density and structure, unspecified site: Secondary | ICD-10-CM | POA: Diagnosis not present

## 2017-07-30 DIAGNOSIS — Z23 Encounter for immunization: Secondary | ICD-10-CM | POA: Diagnosis not present

## 2017-07-30 DIAGNOSIS — Z9181 History of falling: Secondary | ICD-10-CM | POA: Diagnosis not present

## 2017-07-30 DIAGNOSIS — F341 Dysthymic disorder: Secondary | ICD-10-CM | POA: Diagnosis not present

## 2017-07-30 DIAGNOSIS — I10 Essential (primary) hypertension: Secondary | ICD-10-CM | POA: Diagnosis not present

## 2017-07-30 DIAGNOSIS — J4530 Mild persistent asthma, uncomplicated: Secondary | ICD-10-CM | POA: Diagnosis not present

## 2017-07-30 DIAGNOSIS — E785 Hyperlipidemia, unspecified: Secondary | ICD-10-CM | POA: Diagnosis not present

## 2017-07-30 DIAGNOSIS — E119 Type 2 diabetes mellitus without complications: Secondary | ICD-10-CM | POA: Diagnosis not present

## 2017-07-30 DIAGNOSIS — Z6841 Body Mass Index (BMI) 40.0 and over, adult: Secondary | ICD-10-CM | POA: Diagnosis not present

## 2017-08-06 DIAGNOSIS — Z23 Encounter for immunization: Secondary | ICD-10-CM | POA: Diagnosis not present

## 2017-08-20 ENCOUNTER — Encounter: Payer: Self-pay | Admitting: Gastroenterology

## 2017-10-13 ENCOUNTER — Ambulatory Visit: Payer: Medicare Other | Admitting: *Deleted

## 2017-10-13 ENCOUNTER — Other Ambulatory Visit: Payer: Self-pay

## 2017-10-13 VITALS — Ht 65.5 in | Wt 257.2 lb

## 2017-10-13 DIAGNOSIS — Z1211 Encounter for screening for malignant neoplasm of colon: Secondary | ICD-10-CM

## 2017-10-13 MED ORDER — PEG-KCL-NACL-NASULF-NA ASC-C 140 G PO SOLR: 1.0000 | Freq: Once | ORAL | 0 refills | Status: AC

## 2017-10-13 NOTE — Progress Notes (Signed)
Denies allergies to eggs or soy products. Denies complications with sedation or anesthesia. Denies O2 use. Denies use of diet or weight loss medications.  Emmi instructions given for colonoscopy.  

## 2017-10-13 NOTE — Progress Notes (Signed)
Pt has multiple other rx drug insurance coverages. We discussed sending in Plenvu rx and she agreed but will call us if it is not covered so that we may send her other instructions and call in another prep or give sample.

## 2017-10-20 ENCOUNTER — Telehealth: Payer: Self-pay | Admitting: Gastroenterology

## 2017-10-20 NOTE — Telephone Encounter (Signed)
I called [facility] and was told plenvu and suprep will cost pt over 100$- pt states she cannot pay that   Will change to Mira/ Gatorade over the counter - explained to pt what she needs from the pharmacy- we discussed how to do the prep Sunday and Monday- pt to pick up new instructions this week 4th floor [facility] by Friday . Pt verbalized understanding  Meagan Wallace

## 2017-10-27 ENCOUNTER — Other Ambulatory Visit: Payer: Self-pay

## 2017-10-27 ENCOUNTER — Encounter: Payer: Self-pay | Admitting: Gastroenterology

## 2017-10-27 ENCOUNTER — Ambulatory Visit (AMBULATORY_SURGERY_CENTER): Payer: Medicare Other | Admitting: Gastroenterology

## 2017-10-27 VITALS — BP 135/71 | HR 79 | Temp 98.4°F | Resp 20 | Ht 65.5 in | Wt 257.0 lb

## 2017-10-27 DIAGNOSIS — D123 Benign neoplasm of transverse colon: Secondary | ICD-10-CM | POA: Diagnosis not present

## 2017-10-27 DIAGNOSIS — D125 Benign neoplasm of sigmoid colon: Secondary | ICD-10-CM

## 2017-10-27 DIAGNOSIS — D122 Benign neoplasm of ascending colon: Secondary | ICD-10-CM | POA: Diagnosis not present

## 2017-10-27 DIAGNOSIS — D124 Benign neoplasm of descending colon: Secondary | ICD-10-CM

## 2017-10-27 DIAGNOSIS — Z1211 Encounter for screening for malignant neoplasm of colon: Secondary | ICD-10-CM

## 2017-10-27 HISTORY — PX: COLONOSCOPY: SHX174

## 2017-10-27 MED ORDER — SODIUM CHLORIDE 0.9 % IV SOLN: 500.0000 mL | INTRAVENOUS | Status: DC

## 2017-10-27 NOTE — Progress Notes (Signed)
A and O x3. Report to RN. Tolerated MAC anesthesia well.

## 2017-10-27 NOTE — Progress Notes (Signed)
Called to room to assist during endoscopic procedure.  Patient ID and intended procedure confirmed with present staff. Received instructions for my participation in the procedure from the performing physician.  

## 2017-10-27 NOTE — Patient Instructions (Signed)
NO ASPIRIN, ASPIRIN CONTAINING PRODUCTS (BC OR GOODY POWDERS) OR NSAIDS (IBUPROFEN, ADVIL, ALEVE, AND MOTRIN) FOR 5 days after polyp removal; TYLENOL IS OK TO TAKE  Repeat colonoscopy in 1 year  INFORMATION ON POLYPS GIVEN TO YOU TODAY    YOU HAD AN ENDOSCOPIC PROCEDURE TODAY AT [facility]:   Refer to the procedure report that was given to you for any specific questions about what was found during the examination.  If the procedure report does not answer your questions, please call your gastroenterologist to clarify.  If you requested that your care partner not be given the details of your procedure findings, then the procedure report has been included in a sealed envelope for you to review at your convenience later.  YOU SHOULD EXPECT: Some feelings of bloating in the abdomen. Passage of more gas than usual.  Walking can help get rid of the air that was put into your GI tract during the procedure and reduce the bloating. If you had a lower endoscopy (such as a colonoscopy or flexible sigmoidoscopy) you may notice spotting of blood in your stool or on the toilet paper. If you underwent a bowel prep for your procedure, you may not have a normal bowel movement for a few days.  Please Note:  You might notice some irritation and congestion in your nose or some drainage.  This is from the oxygen used during your procedure.  There is no need for concern and it should clear up in a day or so.  SYMPTOMS TO REPORT IMMEDIATELY:   Following lower endoscopy (colonoscopy or flexible sigmoidoscopy):  Excessive amounts of blood in the stool  Significant tenderness or worsening of abdominal pains  Swelling of the abdomen that is new, acute  Fever of 100F or higher    For urgent or emergent issues, a gastroenterologist can be reached at any hour by calling [phone].   DIET:  We do recommend a small meal at first, but then you may proceed to your regular diet.  Drink plenty of  fluids but you should avoid alcoholic beverages for 24 hours.  ACTIVITY:  You should plan to take it easy for the rest of today and you should NOT DRIVE or use heavy machinery until tomorrow (because of the sedation medicines used during the test).    FOLLOW UP: Our staff will call the number listed on your records the next business day following your procedure to check on you and address any questions or concerns that you may have regarding the information given to you following your procedure. If we do not reach you, we will leave a message.  However, if you are feeling well and you are not experiencing any problems, there is no need to return our call.  We will assume that you have returned to your regular daily activities without incident.  If any biopsies were taken you will be contacted by phone or by letter within the next 1-3 weeks.  Please call us at [phone] if you have not heard about the biopsies in 3 weeks.    SIGNATURES/CONFIDENTIALITY: You and/or your care partner have signed paperwork which will be entered into your electronic medical record.  These signatures attest to the fact that that the information above on your After Visit Summary has been reviewed and is understood.  Full responsibility of the confidentiality of this discharge information lies with you and/or your care-partner.

## 2017-10-27 NOTE — Progress Notes (Signed)
Pt. Reports no change in her medical or surgical history since her pre-visit.

## 2017-10-27 NOTE — Op Note (Signed)
[facility] Patient Name: Meagan Wallace Procedure Date: 10/27/2017 9:22 AM MRN: [id number] Endoscopist: Mallie Mussel L. Loletha Carrow , MD Age: 72 Referring MD:  Date of Birth: 1946-09-10 Gender: Female Account #: [id number] Procedure:                Colonoscopy Indications:              Screening for colorectal malignant neoplasm (last                            colonoscopy about 15 years ago) Medicines:                Monitored Anesthesia Care Procedure:                Pre-Anesthesia Assessment:                           - Prior to the procedure, a History and Physical                            was performed, and patient medications and                            allergies were reviewed. The patient's tolerance of                            previous anesthesia was also reviewed. The risks                            and benefits of the procedure and the sedation                            options and risks were discussed with the patient.                            All questions were answered, and informed consent                            was obtained. Anticoagulants: The patient has taken                            aspirin. It was decided not to withhold this                            medication prior to the procedure. ASA Grade                            Assessment: III - A patient with severe systemic                            disease. After reviewing the risks and benefits,                            the patient was deemed in satisfactory condition to  undergo the procedure.                           After obtaining informed consent, the colonoscope                            was passed under direct vision. Throughout the                            procedure, the patient's blood pressure, pulse, and                            oxygen saturations were monitored continuously. The                            Colonoscope was introduced through the anus  and                            advanced to the the cecum, identified by                            appendiceal orifice and ileocecal valve. The                            colonoscopy was performed without difficulty. The                            patient tolerated the procedure well. The quality                            of the bowel preparation was good. The ileocecal                            valve, appendiceal orifice, and rectum were                            photographed. The quality of the bowel preparation                            was evaluated using the BBPS [facility] Bowel                            Preparation Scale) with scores of: Right Colon = 2,                            Transverse Colon = 2 and Left Colon = 2. The total                            BBPS score equals 6. The bowel preparation used was                            Plenvu. Scope In: 9:29:37 AM Scope Out: 10:06:40 AM Scope Withdrawal Time: 0 hours 34 minutes 26 seconds  Total Procedure Duration:  0 hours 37 minutes 3 seconds  Findings:                 The perianal and digital rectal examinations were                            normal.                           A few medium-sized angioectasias were found in the                            ascending colon.                           Eleven sessile polyps were found in the sigmoid                            colon, descending colon, transverse colon and                            ascending colon. The polyps were 2 to 8 mm in size.                            These polyps were removed with a cold snare.                            Resection and retrieval were complete except for                            one diminutive AC polyp not retrieved.                           The colon (entire examined portion) was moderately                            redundant.                           The exam was otherwise without abnormality on                            direct and  retroflexion views. Complications:            No immediate complications. Estimated Blood Loss:     Estimated blood loss was minimal. Impression:               - A few colonic angioectasias.                           - Eleven 2 to 8 mm polyps in the sigmoid colon, in                            the descending colon, in the transverse colon and  in the ascending colon, removed with a cold snare.                            Resected and retrieved.                           - Redundant colon.                           - The examination was otherwise normal on direct                            and retroflexion views. Recommendation:           - Patient has a contact number available for                            emergencies. The signs and symptoms of potential                            delayed complications were discussed with the                            patient. Return to normal activities tomorrow.                            Written discharge instructions were provided to the                            patient.                           - Resume previous diet.                           - No aspirin, ibuprofen, naproxen, or other                            non-steroidal anti-inflammatory drugs for 5 days                            after polyp removal.                           - Await pathology results.                           - Repeat colonoscopy in 1 year for surveillance. Lynnette Pote L. Loletha Carrow, MD 10/27/2017 10:12:11 AM This report has been signed electronically.

## 2017-10-28 ENCOUNTER — Telehealth: Payer: Self-pay | Admitting: *Deleted

## 2017-10-28 NOTE — Telephone Encounter (Signed)
  Follow up Call-  Call back number 10/27/2017  Post procedure Call Back phone  # [phone]  Permission to leave phone message Yes  Some recent data might be hidden     Patient questions:  Do you have a fever, pain , or abdominal swelling? No. Pain Score  0 *  Have you tolerated food without any problems? Yes.    Have you been able to return to your normal activities? Yes.    Do you have any questions about your discharge instructions: Diet   No. Medications  No. Follow up visit  No.  Do you have questions or concerns about your Care? No.  Actions: * If pain score is 4 or above: No action needed, pain <4.

## 2017-10-31 ENCOUNTER — Encounter: Payer: Self-pay | Admitting: Gastroenterology

## 2017-12-23 ENCOUNTER — Telehealth: Payer: Self-pay | Admitting: Gastroenterology

## 2017-12-23 NOTE — Telephone Encounter (Signed)
Patient states she was reviewing results from colon on 1.14.19 and has a questions about the results. Patient requesting a call back to discuss.

## 2017-12-24 NOTE — Telephone Encounter (Signed)
Patient had questions from her colonoscopy report, what is ASA Grade Assessment: III,  stating she had severe systemic disease. Explained this is just an assessment which is part of the History and Physical obtained prior to the procedure. Reflecting her medical history of hypertension, diabetes etc. Patients questions answered, she understands that she will need another repeat colonoscopy in one year to follow up on the colon polyps.

## 2018-05-11 ENCOUNTER — Other Ambulatory Visit: Payer: Self-pay | Admitting: Hematology and Oncology

## 2018-05-11 DIAGNOSIS — Z853 Personal history of malignant neoplasm of breast: Secondary | ICD-10-CM

## 2018-06-16 ENCOUNTER — Inpatient Hospital Stay: Payer: Medicare Other | Attending: Hematology and Oncology | Admitting: Hematology and Oncology

## 2018-06-16 ENCOUNTER — Telehealth: Payer: Self-pay | Admitting: Hematology and Oncology

## 2018-06-16 DIAGNOSIS — M8580 Other specified disorders of bone density and structure, unspecified site: Secondary | ICD-10-CM | POA: Diagnosis not present

## 2018-06-16 DIAGNOSIS — Z171 Estrogen receptor negative status [ER-]: Secondary | ICD-10-CM | POA: Diagnosis not present

## 2018-06-16 DIAGNOSIS — Z923 Personal history of irradiation: Secondary | ICD-10-CM | POA: Diagnosis not present

## 2018-06-16 DIAGNOSIS — Z79810 Long term (current) use of selective estrogen receptor modulators (SERMs): Secondary | ICD-10-CM | POA: Insufficient documentation

## 2018-06-16 DIAGNOSIS — Z8673 Personal history of transient ischemic attack (TIA), and cerebral infarction without residual deficits: Secondary | ICD-10-CM | POA: Insufficient documentation

## 2018-06-16 DIAGNOSIS — M170 Bilateral primary osteoarthritis of knee: Secondary | ICD-10-CM | POA: Diagnosis not present

## 2018-06-16 DIAGNOSIS — E119 Type 2 diabetes mellitus without complications: Secondary | ICD-10-CM | POA: Diagnosis not present

## 2018-06-16 DIAGNOSIS — N951 Menopausal and female climacteric states: Secondary | ICD-10-CM

## 2018-06-16 DIAGNOSIS — D0511 Intraductal carcinoma in situ of right breast: Secondary | ICD-10-CM

## 2018-06-16 MED ORDER — TAMOXIFEN CITRATE 20 MG PO TABS: 20.0000 mg | ORAL_TABLET | Freq: Every day | ORAL | 3 refills | Status: DC

## 2018-06-16 NOTE — Telephone Encounter (Signed)
Gave pt avs and calendar  °

## 2018-06-16 NOTE — Assessment & Plan Note (Signed)
Right breast DCIS intermediate grade, ER 90%, PR 0% (on initial biopsy she was started to high-grade DCIS that was ER/PR negative); status post lumpectomy, radiation and is currently on tamoxifen since November 2015 Patient saw Dr. Humphrey Rolls and Dr. Marko Plume previously  Tamoxifen toxicities: 1. Occasional hot flashes Lots of arthritis I discussed with her that she will complete tamoxifen therapy but November 2020  Surveillance:  1. Mammograms scheduled for 06/17/2018 2. Breast exam 06/16/2018: Benign  Osteopenia Morbid obesity Diabetes History of CVA related to rheumatic fever at age 45  Return to clinic in 1 year for follow-up

## 2018-06-16 NOTE — Progress Notes (Signed)
Patient Care Team: Charlynn Court, NP as PCP - General (Nurse Practitioner)  DIAGNOSIS:  Encounter Diagnosis  Name Primary?  . Ductal carcinoma in situ (DCIS) of right breast     SUMMARY OF ONCOLOGIC HISTORY:   Ductal carcinoma in situ (DCIS) of right breast   11/11/2013 Initial Diagnosis    Ductal carcinoma in situ (DCIS) of right breast diagnosed at [facility], 1.9 cm mass at 12:00, DCIS high-grade ER/PR negative, MRI revealed 2.6 cm mass along with bilateral enhancements (fibroadenomas on biopsy)    12/27/2013 Surgery    Right lumpectomy with Dr. Brantley Stage: Intermediate grade DCIS ER 90%, PR 0%, [facility]    02/02/2014 - 03/17/2014 Radiation Therapy    Adjuvant radiation therapy by Dr. Pablo Ledger 50 Gy to right breast with 10 Gy boost    08/23/2014 -  Anti-estrogen oral therapy    Tamoxifen 20 mg daily     CHIEF COMPLIANT: Follow-up on tamoxifen therapy  INTERVAL HISTORY: Meagan Wallace is a 72 year old with above-mentioned history of DCIS who is currently on tamoxifen therapy since 2015.  She complains of hot flashes as well as joint aches and pains but it is probably not related to tamoxifen.  She has lost 10 pounds since last year.  She has been on no milk and no sugar diet.  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  REVIEW OF SYSTEMS:   Constitutional: Denies fevers, chills or abnormal weight loss Eyes: Denies blurriness of vision Ears, nose, mouth, throat, and face: Denies mucositis or sore throat Respiratory: Denies cough, dyspnea or wheezes Cardiovascular: Denies palpitation, chest discomfort Gastrointestinal:  Denies nausea, heartburn or change in bowel habits Skin: Denies abnormal skin rashes Lymphatics: Denies new lymphadenopathy or easy bruising Neurological:Denies numbness, tingling or new weaknesses Behavioral/Psych: Mood is stable, no new changes  Extremities: Severe arthritis Breast:  denies any pain or lumps or nodules in either breasts All  other systems were reviewed with the patient and are negative.  I have reviewed the past medical history, past surgical history, social history and family history with the patient and they are unchanged from previous note.  ALLERGIES:  has No Known Allergies.  MEDICATIONS:  Current Outpatient Medications  Medication Sig Dispense Refill  . albuterol (PROVENTIL) (2.5 MG/3ML) 0.083% nebulizer solution Take 2.5 mg by nebulization every 6 (six) hours as needed for wheezing or shortness of breath.    Marland Kitchen aluminum hydroxide-magnesium carbonate (GAVISCON) 95-358 MG/15ML SUSP Take 30 mLs by mouth as needed. Reported on 01/22/2016    . amLODipine (NORVASC) 10 MG tablet Take 10 mg by mouth daily.    . Ascorbic Acid (VITAMIN C PO) Take 600 mg by mouth 2 (two) times daily.     Marland Kitchen aspirin 325 MG tablet Take 325 mg by mouth daily.    . Cholecalciferol (VITAMIN D3 PO) Vitamin D3 1,000 unit capsule  Take by oral route.    . metFORMIN (GLUCOPHAGE) 500 MG tablet Take 500 mg by mouth 2 (two) times daily with a meal.    . mometasone-formoterol (DULERA) 100-5 MCG/ACT AERO Inhale 2 puffs into the lungs as needed for wheezing.    . nystatin (MYCOSTATIN) powder     . potassium chloride (K-DUR) 10 MEQ tablet Take 10 mEq by mouth daily.     . pravastatin (PRAVACHOL) 10 MG tablet pravastatin 10 mg tablet    . predniSONE (DELTASONE) 20 MG tablet Take 40 mg by mouth daily.  0  . ramipril (ALTACE) 10 MG capsule  ramipril 10 mg capsule    . tamoxifen (NOLVADEX) 20 MG tablet Take 1 tablet (20 mg total) by mouth daily. 90 tablet 3  . venlafaxine XR (EFFEXOR-XR) 150 MG 24 hr capsule      Current Facility-Administered Medications  Medication Dose Route Frequency Provider Last Rate Last Dose  . 0.9 %  sodium chloride infusion  500 mL Intravenous Continuous Danis, Estill Cotta III, MD        PHYSICAL EXAMINATION: ECOG PERFORMANCE STATUS: 1 - Symptomatic but completely ambulatory  Vitals:   06/16/18 0843  BP: (!) 144/84  Pulse:  83  Resp: 18  Temp: 98.1 F (36.7 C)  SpO2: 93%   Filed Weights   06/16/18 0843  Weight: 246 lb 6.4 oz (111.8 kg)    GENERAL:alert, no distress and comfortable SKIN: skin color, texture, turgor are normal, no rashes or significant lesions EYES: normal, Conjunctiva are pink and non-injected, sclera clear OROPHARYNX:no exudate, no erythema and lips, buccal mucosa, and tongue normal  NECK: supple, thyroid normal size, non-tender, without nodularity LYMPH:  no palpable lymphadenopathy in the cervical, axillary or inguinal LUNGS: clear to auscultation and percussion with normal breathing effort HEART: regular rate & rhythm and no murmurs and no lower extremity edema ABDOMEN:abdomen soft, non-tender and normal bowel sounds MUSCULOSKELETAL:no cyanosis of digits and no clubbing  NEURO: alert & oriented x 3 with fluent speech, no focal motor/sensory deficits EXTREMITIES: No lower extremity edema BREAST: No palpable masses or nodules in either right or left breasts. No palpable axillary supraclavicular or infraclavicular adenopathy no breast tenderness or nipple discharge. (exam performed in the presence of a chaperone)  LABORATORY DATA:  I have reviewed the data as listed CMP Latest Ref Rng & Units 05/06/2017 08/01/2016 01/22/2016  Glucose 65 - 99 mg/dL 111(H) 137 98  BUN 6 - 20 mg/dL 8 11.9 13.5  Creatinine 0.44 - 1.00 mg/dL 0.64 0.7 0.8  Sodium 135 - 145 mmol/L 137 141 143  Potassium 3.5 - 5.1 mmol/L 3.5 4.5 3.7  Chloride 101 - 111 mmol/L 101 - -  CO2 22 - 32 mmol/L 27 22 27   Calcium 8.9 - 10.3 mg/dL 9.1 9.1 9.3  Total Protein 6.4 - 8.3 g/dL - 6.8 6.7  Total Bilirubin 0.20 - 1.20 mg/dL - 0.55 0.75  Alkaline Phos 40 - 150 U/L - 108 81  AST 5 - 34 U/L - 34 57(H)  ALT 0 - 55 U/L - 22 36    Lab Results  Component Value Date   WBC 9.9 05/06/2017   HGB 13.6 05/06/2017   HCT 41.0 05/06/2017   MCV 84.2 05/06/2017   PLT 183 05/06/2017   NEUTROABS 7.1 (H) 08/01/2016    ASSESSMENT &  PLAN:  Ductal carcinoma in situ (DCIS) of right breast Right breast DCIS intermediate grade, ER 90%, PR 0% (on initial biopsy she was started to high-grade DCIS that was ER/PR negative); status post lumpectomy, radiation and is currently on tamoxifen since November 2015 Patient saw Dr. Humphrey Rolls and Dr. Marko Plume previously  Tamoxifen toxicities: 1. Occasional hot flashes Lots of arthritis I discussed with her that she will complete tamoxifen therapy but November 2020  Surveillance:  1. Mammograms scheduled for 06/17/2018 2. Breast exam 06/16/2018: Benign  Osteopenia Morbid obesity: She has lost 10 pounds since last year. 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  Return to clinic in 1 year for follow-up  No  orders of the defined types were placed in this encounter.  The patient has a good understanding of the overall plan. she agrees with it. she will call with any problems that may develop before the next visit here.   Harriette Ohara, MD 06/16/18

## 2018-06-17 ENCOUNTER — Ambulatory Visit
Admission: RE | Admit: 2018-06-17 | Discharge: 2018-06-17 | Disposition: A | Discharge status: Home / Self Care | Payer: Medicare Other | Source: Ambulatory Visit | Attending: Hematology and Oncology | Admitting: Hematology and Oncology

## 2018-06-17 DIAGNOSIS — Z853 Personal history of malignant neoplasm of breast: Secondary | ICD-10-CM

## 2018-10-26 ENCOUNTER — Encounter: Payer: Self-pay | Admitting: Gastroenterology

## 2018-11-12 ENCOUNTER — Other Ambulatory Visit: Payer: Self-pay

## 2018-11-12 ENCOUNTER — Ambulatory Visit (AMBULATORY_SURGERY_CENTER): Payer: Self-pay | Admitting: *Deleted

## 2018-11-12 VITALS — Ht 62.5 in | Wt 254.8 lb

## 2018-11-12 DIAGNOSIS — Z86010 Personal history of colonic polyps: Secondary | ICD-10-CM

## 2018-11-12 MED ORDER — PEG-KCL-NACL-NASULF-NA ASC-C 140 G PO SOLR: 1.0000 | Freq: Once | ORAL | 0 refills | Status: AC

## 2018-11-12 NOTE — Progress Notes (Signed)
Patient denies any allergies to egg or soy products. Patient has PONV with anesthesia/sedation.  Patient denies oxygen use at home and denies diet medications. Patient denies information on colonoscopy.

## 2018-11-26 ENCOUNTER — Encounter: Payer: Self-pay | Admitting: Gastroenterology

## 2018-11-26 ENCOUNTER — Ambulatory Visit (AMBULATORY_SURGERY_CENTER): Payer: Medicare Other | Admitting: Gastroenterology

## 2018-11-26 VITALS — BP 113/71 | HR 76 | Temp 97.8°F | Resp 15 | Ht 62.5 in | Wt 254.0 lb

## 2018-11-26 DIAGNOSIS — D122 Benign neoplasm of ascending colon: Secondary | ICD-10-CM

## 2018-11-26 DIAGNOSIS — D124 Benign neoplasm of descending colon: Secondary | ICD-10-CM

## 2018-11-26 DIAGNOSIS — Z86010 Personal history of colonic polyps: Secondary | ICD-10-CM | POA: Diagnosis present

## 2018-11-26 MED ORDER — SODIUM CHLORIDE 0.9 % IV SOLN: 500.0000 mL | Freq: Once | INTRAVENOUS | Status: DC

## 2018-11-26 NOTE — Progress Notes (Signed)
PT taken to PACU. Monitors in place. VSS. Report given to RN. 

## 2018-11-26 NOTE — Patient Instructions (Signed)
Handouts Provided:  Polyps and Diverticulosis  YOU HAD AN ENDOSCOPIC PROCEDURE TODAY AT [facility]:   Refer to the procedure report that was given to you for any specific questions about what was found during the examination.  If the procedure report does not answer your questions, please call your gastroenterologist to clarify.  If you requested that your care partner not be given the details of your procedure findings, then the procedure report has been included in a sealed envelope for you to review at your convenience later.  YOU SHOULD EXPECT: Some feelings of bloating in the abdomen. Passage of more gas than usual.  Walking can help get rid of the air that was put into your GI tract during the procedure and reduce the bloating. If you had a lower endoscopy (such as a colonoscopy or flexible sigmoidoscopy) you may notice spotting of blood in your stool or on the toilet paper. If you underwent a bowel prep for your procedure, you may not have a normal bowel movement for a few days.  Please Note:  You might notice some irritation and congestion in your nose or some drainage.  This is from the oxygen used during your procedure.  There is no need for concern and it should clear up in a day or so.  SYMPTOMS TO REPORT IMMEDIATELY:   Following lower endoscopy (colonoscopy or flexible sigmoidoscopy):  Excessive amounts of blood in the stool  Significant tenderness or worsening of abdominal pains  Swelling of the abdomen that is new, acute  Fever of 100F or higher  For urgent or emergent issues, a gastroenterologist can be reached at any hour by calling [phone].   DIET:  We do recommend a small meal at first, but then you may proceed to your regular diet.  Drink plenty of fluids but you should avoid alcoholic beverages for 24 hours.  ACTIVITY:  You should plan to take it easy for the rest of today and you should NOT DRIVE or use heavy machinery until tomorrow (because of  the sedation medicines used during the test).    FOLLOW UP: Our staff will call the number listed on your records the next business day following your procedure to check on you and address any questions or concerns that you may have regarding the information given to you following your procedure. If we do not reach you, we will leave a message.  However, if you are feeling well and you are not experiencing any problems, there is no need to return our call.  We will assume that you have returned to your regular daily activities without incident.  If any biopsies were taken you will be contacted by phone or by letter within the next 1-3 weeks.  Please call us at [phone] if you have not heard about the biopsies in 3 weeks.    SIGNATURES/CONFIDENTIALITY: You and/or your care partner have signed paperwork which will be entered into your electronic medical record.  These signatures attest to the fact that that the information above on your After Visit Summary has been reviewed and is understood.  Full responsibility of the confidentiality of this discharge information lies with you and/or your care-partner.

## 2018-11-26 NOTE — Progress Notes (Signed)
Pt's states no medical or surgical changes since previsit or office visit. 

## 2018-11-26 NOTE — Op Note (Signed)
Meagan Wallace Patient Name: Meagan Wallace Procedure Date: 11/26/2018 7:57 AM MRN: [id number] Endoscopist: Mallie Mussel L. Loletha Carrow , MD Age: 73 Referring MD:  Date of Birth: [birth year] Gender: Female Account #: [id number] Procedure:                Colonoscopy Indications:              Surveillance: History of numerous (> 10) adenomas                            on last colonoscopy 10/2017) Medicines:                Monitored Anesthesia Care Procedure:                Pre-Anesthesia Assessment:                           - Prior to the procedure, a History and Physical                            was performed, and patient medications and                            allergies were reviewed. The patient's tolerance of                            previous anesthesia was also reviewed. The risks                            and benefits of the procedure and the sedation                            options and risks were discussed with the patient.                            All questions were answered, and informed consent                            was obtained. Anticoagulants: The patient has taken                            aspirin. It was decided not to withhold this                            medication prior to the procedure. ASA Grade                            Assessment: III - A patient with severe systemic                            disease. After reviewing the risks and benefits,                            the patient was deemed in satisfactory condition to  undergo the procedure.                           After obtaining informed consent, the colonoscope                            was passed under direct vision. Throughout the                            procedure, the patient's blood pressure, pulse, and                            oxygen saturations were monitored continuously. The                            Colonoscope was introduced through the anus and                            advanced to the the cecum, identified by                            appendiceal orifice and ileocecal valve. The                            colonoscopy was performed without difficulty. The                            patient tolerated the procedure well. The quality                            of the bowel preparation was excellent. The                            ileocecal valve, appendiceal orifice, and rectum                            were photographed. Scope In: 8:02:34 AM Scope Out: 8:19:58 AM Scope Withdrawal Time: 0 hours 14 minutes 39 seconds  Total Procedure Duration: 0 hours 17 minutes 24 seconds  Findings:                 The perianal and digital rectal examinations were                            normal.                           A few medium-sized angioectasias were found in the                            ascending colon.                           There was a medium-sized lipoma, in the ascending  colon.                           Two sessile polyps were found in the descending                            colon and ascending colon. The polyps were 4 mm in                            size. These polyps were removed with a cold snare.                            Resection and retrieval were complete.                           A 2 mm polyp was found in the ascending colon. The                            polyp was sessile. The polyp was removed with a                            cold biopsy forceps. Resection and retrieval were                            complete.                           A few diverticula were found in the left colon and                            right colon.                           The exam was otherwise without abnormality on                            direct and retroflexion views. Complications:            No immediate complications. Estimated Blood Loss:     Estimated blood loss was minimal. Impression:                - A few colonic angioectasias.                           - Medium-sized lipoma in the ascending colon.                           - Two 4 mm polyps in the descending colon and in                            the ascending colon, removed with a cold snare.                            Resected and retrieved.                           -  One 2 mm polyp in the ascending colon, removed                            with a cold biopsy forceps. Resected and retrieved.                           - Diverticulosis in the left colon and in the right                            colon.                           - The examination was otherwise normal on direct                            and retroflexion views. Recommendation:           - Patient has a contact number available for                            emergencies. The signs and symptoms of potential                            delayed complications were discussed with the                            patient. Return to normal activities tomorrow.                            Written discharge instructions were provided to the                            patient.                           - Resume previous diet.                           - Continue present medications.                           - Await pathology results.                           - Repeat colonoscopy in 2 years for surveillance. Cartha Rotert L. Loletha Carrow, MD 11/26/2018 8:25:52 AM This report has been signed electronically.

## 2018-11-27 ENCOUNTER — Telehealth: Payer: Self-pay | Admitting: *Deleted

## 2018-11-27 NOTE — Telephone Encounter (Signed)
  Follow up Call-  Call back number 11/26/2018 10/27/2017  Post procedure Call Back phone  # [phone]  Permission to leave phone message Yes Yes  Some recent data might be hidden     Patient questions:  Do you have a fever, pain , or abdominal swelling? No. Pain Score  0 *  Have you tolerated food without any problems? Yes.    Have you been able to return to your normal activities? Yes.    Do you have any questions about your discharge instructions: Diet   No. Medications  No. Follow up visit  No.  Do you have questions or concerns about your Care? No.  Actions: * If pain score is 4 or above: No action needed, pain <4.

## 2018-11-30 ENCOUNTER — Encounter: Payer: Self-pay | Admitting: Gastroenterology

## 2019-05-21 ENCOUNTER — Other Ambulatory Visit: Payer: Self-pay | Admitting: Hematology and Oncology

## 2019-05-21 DIAGNOSIS — Z853 Personal history of malignant neoplasm of breast: Secondary | ICD-10-CM

## 2019-06-15 ENCOUNTER — Ambulatory Visit: Payer: Medicare Other | Admitting: Hematology and Oncology

## 2019-06-23 ENCOUNTER — Telehealth: Payer: Self-pay | Admitting: Hematology and Oncology

## 2019-06-23 NOTE — Telephone Encounter (Signed)
Called pt - no answer - called sister - no answer , left message for pt to call for appt date and time

## 2019-06-24 ENCOUNTER — Ambulatory Visit
Admission: RE | Admit: 2019-06-24 | Discharge: 2019-06-24 | Disposition: A | Discharge status: Home / Self Care | Payer: Medicare Other | Source: Ambulatory Visit | Attending: Hematology and Oncology | Admitting: Hematology and Oncology

## 2019-06-24 ENCOUNTER — Other Ambulatory Visit: Payer: Self-pay

## 2019-06-24 DIAGNOSIS — Z853 Personal history of malignant neoplasm of breast: Secondary | ICD-10-CM

## 2019-06-30 NOTE — Progress Notes (Signed)
Patient Care Team: Meagan Court, NP as PCP - General (Nurse Practitioner)  DIAGNOSIS:    ICD-10-CM   1. Ductal carcinoma in situ (DCIS) of right breast  D05.11     SUMMARY OF ONCOLOGIC HISTORY: Oncology History  Ductal carcinoma in situ (DCIS) of right breast  11/11/2013 Initial Diagnosis   Ductal carcinoma in situ (DCIS) of right breast diagnosed at [facility], 1.9 cm mass at 12:00, DCIS high-grade ER/PR negative, MRI revealed 2.6 cm mass along with bilateral enhancements (fibroadenomas on biopsy)   12/27/2013 Surgery   Right lumpectomy with Dr. Brantley Stage: Intermediate grade DCIS ER 90%, PR 0%, [facility]   02/02/2014 - 03/17/2014 Radiation Therapy   Adjuvant radiation therapy by Dr. Pablo Ledger 50 Gy to right breast with 10 Gy boost   08/23/2014 -  Anti-estrogen oral therapy   Tamoxifen 20 mg daily     CHIEF COMPLIANT: Follow-up on tamoxifen therapy  INTERVAL HISTORY: Meagan Wallace is a 73 y.o. with above-mentioned history of right breast DCIS treated with lumpectomy, radiation, and who is currently on tamoxifen therapy. I last saw her a year ago. Mammogram on 06/24/19 showed no evidence of malignancy bilaterally. She presents to the clinic today for annual follow-up.  She has major problems with arthritis and undergoing injections to the knees.  She has not lost any more weight in fact she gained some weight.  Recent mammograms were normal.  REVIEW OF SYSTEMS:   Constitutional: Denies fevers, chills or abnormal weight loss Eyes: Denies blurriness of vision Ears, nose, mouth, throat, and face: Denies mucositis or sore throat Respiratory: Denies cough, dyspnea or wheezes Cardiovascular: Denies palpitation, chest discomfort Gastrointestinal: Denies nausea, heartburn or change in bowel habits Skin: Denies abnormal skin rashes Lymphatics: Denies new lymphadenopathy or easy bruising Neurological: Denies numbness, tingling or new weaknesses Behavioral/Psych: Mood is stable, no  new changes  Extremities: No lower extremity edema Breast: denies any pain or lumps or nodules in either breasts All other systems were reviewed with the patient and are negative.  I have reviewed the past medical history, past surgical history, social history and family history with the patient and they are unchanged from previous note.  ALLERGIES:  has No Known Allergies.  MEDICATIONS:  Current Outpatient Medications  Medication Sig Dispense Refill  . albuterol (PROVENTIL) (2.5 MG/3ML) 0.083% nebulizer solution Take 2.5 mg by nebulization every 6 (six) hours as needed for wheezing or shortness of breath.    Marland Kitchen amLODipine (NORVASC) 10 MG tablet Take 10 mg by mouth daily.    . Ascorbic Acid (VITAMIN C PO) Take 600 mg by mouth 2 (two) times daily.     Marland Kitchen aspirin 325 MG tablet Take 325 mg by mouth daily.    . Cholecalciferol (VITAMIN D3 PO) Vitamin D3 1,000 unit capsule  Take by oral route.    . furosemide (LASIX) 20 MG tablet TAKE 1 TABLET BY MOUTH ONCE DAILY FOR SWELLING    . metFORMIN (GLUCOPHAGE) 500 MG tablet Take 2 tablets (1,000 mg total) by mouth 2 (two) times daily with a meal.    . mometasone-formoterol (DULERA) 100-5 MCG/ACT AERO Inhale 2 puffs into the lungs as needed for wheezing.    . nystatin (MYCOSTATIN) powder     . pravastatin (PRAVACHOL) 10 MG tablet pravastatin 10 mg tablet    . ramipril (ALTACE) 10 MG capsule ramipril 10 mg capsule    . sodium chloride (MURO 128) 2 % ophthalmic solution Place 1 drop into both eyes at bedtime.    Marland Kitchen  venlafaxine XR (EFFEXOR-XR) 150 MG 24 hr capsule      No current facility-administered medications for this visit.     PHYSICAL EXAMINATION: ECOG PERFORMANCE STATUS: 1 - Symptomatic but completely ambulatory  Vitals:   07/01/19 1204  BP: (!) 158/89  Resp: 17  Temp: 97.8 F (36.6 C)  SpO2: 95%   Filed Weights   07/01/19 1204  Weight: 250 lb 11.2 oz (113.7 kg)    GENERAL: alert, no distress and comfortable SKIN: skin color,  texture, turgor are normal, no rashes or significant lesions EYES: normal, Conjunctiva are pink and non-injected, sclera clear OROPHARYNX: no exudate, no erythema and lips, buccal mucosa, and tongue normal  NECK: supple, thyroid normal size, non-tender, without nodularity LYMPH: no palpable lymphadenopathy in the cervical, axillary or inguinal LUNGS: clear to auscultation and percussion with normal breathing effort HEART: regular rate & rhythm and no murmurs and no lower extremity edema ABDOMEN: abdomen soft, non-tender and normal bowel sounds MUSCULOSKELETAL: no cyanosis of digits and no clubbing  NEURO: alert & oriented x 3 with fluent speech, no focal motor/sensory deficits EXTREMITIES: No lower extremity edema    LABORATORY DATA:  I have reviewed the data as listed CMP Latest Ref Rng & Units 05/06/2017 08/01/2016 01/22/2016  Glucose 65 - 99 mg/dL 111(H) 137 98  BUN 6 - 20 mg/dL 8 11.9 13.5  Creatinine 0.44 - 1.00 mg/dL 0.64 0.7 0.8  Sodium 135 - 145 mmol/L 137 141 143  Potassium 3.5 - 5.1 mmol/L 3.5 4.5 3.7  Chloride 101 - 111 mmol/L 101 - -  CO2 22 - 32 mmol/L 27 22 27   Calcium 8.9 - 10.3 mg/dL 9.1 9.1 9.3  Total Protein 6.4 - 8.3 g/dL - 6.8 6.7  Total Bilirubin 0.20 - 1.20 mg/dL - 0.55 0.75  Alkaline Phos 40 - 150 U/L - 108 81  AST 5 - 34 U/L - 34 57(H)  ALT 0 - 55 U/L - 22 36    Lab Results  Component Value Date   WBC 9.9 05/06/2017   HGB 13.6 05/06/2017   HCT 41.0 05/06/2017   MCV 84.2 05/06/2017   PLT 183 05/06/2017   NEUTROABS 7.1 (H) 08/01/2016    ASSESSMENT & PLAN:  Ductal carcinoma in situ (DCIS) of right breast Right breast DCIS intermediate grade, ER 90%, PR 0%(on initial biopsy she was started to high-grade DCIS that was ER/PR negative);status post lumpectomy, radiation and is currently on tamoxifen since November 2015 Patient saw Dr. Humphrey Rolls and Dr. Marko Plume previously  Tamoxifen toxicities: 1.Occasional hot flashes Lots of arthritis I discussed with  her that she will complete tamoxifen therapy but November 2020  Surveillance:  1. Mammograms  06/24/2019: Benign breast density category B 2. Breast exam 07/01/2019: Benign  Osteopenia Morbid obesity: She has lost 10 pounds since last year. 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  Patient will be seen on an as-needed basis.  She would like to follow with the PCP for her annual checkups and mammograms.    No orders of the defined types were placed in this encounter.  The patient has a good understanding of the overall plan. she agrees with it. she will call with any problems that may develop before the next visit here.  Nicholas Lose, MD 07/01/2019  Julious Oka Dorshimer am acting as scribe for Dr. Nicholas Lose.  I have reviewed the above documentation for accuracy and completeness,  and I agree with the above.

## 2019-07-01 ENCOUNTER — Inpatient Hospital Stay: Payer: Medicare Other | Attending: Hematology and Oncology | Admitting: Hematology and Oncology

## 2019-07-01 ENCOUNTER — Other Ambulatory Visit: Payer: Self-pay

## 2019-07-01 DIAGNOSIS — M170 Bilateral primary osteoarthritis of knee: Secondary | ICD-10-CM | POA: Insufficient documentation

## 2019-07-01 DIAGNOSIS — Z8673 Personal history of transient ischemic attack (TIA), and cerebral infarction without residual deficits: Secondary | ICD-10-CM | POA: Insufficient documentation

## 2019-07-01 DIAGNOSIS — Z170 Estrogen receptor positive status [ER+]: Secondary | ICD-10-CM | POA: Insufficient documentation

## 2019-07-01 DIAGNOSIS — D0511 Intraductal carcinoma in situ of right breast: Secondary | ICD-10-CM | POA: Insufficient documentation

## 2019-07-01 DIAGNOSIS — Z79810 Long term (current) use of selective estrogen receptor modulators (SERMs): Secondary | ICD-10-CM | POA: Insufficient documentation

## 2019-07-01 DIAGNOSIS — M8580 Other specified disorders of bone density and structure, unspecified site: Secondary | ICD-10-CM | POA: Insufficient documentation

## 2019-07-01 DIAGNOSIS — E119 Type 2 diabetes mellitus without complications: Secondary | ICD-10-CM | POA: Insufficient documentation

## 2019-07-01 MED ORDER — METFORMIN HCL 500 MG PO TABS: 1000.0000 mg | ORAL_TABLET | Freq: Two times a day (BID) | ORAL | Status: AC

## 2019-07-01 NOTE — Assessment & Plan Note (Signed)
Right breast DCIS intermediate grade, ER 90%, PR 0%(on initial biopsy she was started to high-grade DCIS that was ER/PR negative);status post lumpectomy, radiation and is currently on tamoxifen since November 2015 Patient saw Dr. Humphrey Rolls and Dr. Marko Plume previously  Tamoxifen toxicities: 1.Occasional hot flashes Lots of arthritis I discussed with her that she will complete tamoxifen therapy but November 2020  Surveillance:  1. Mammograms  06/24/2019: Benign breast density category B 2. Breast exam 07/01/2019: Benign  Osteopenia Morbid obesity: She has lost 10 pounds since last year. 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  Return to clinic in 1 year for follow-up

## 2019-07-04 ENCOUNTER — Other Ambulatory Visit: Payer: Self-pay | Admitting: Hematology and Oncology

## 2020-12-20 ENCOUNTER — Other Ambulatory Visit: Payer: Self-pay | Admitting: Gastroenterology

## 2020-12-20 DIAGNOSIS — K7469 Other cirrhosis of liver: Secondary | ICD-10-CM

## 2021-01-08 ENCOUNTER — Ambulatory Visit
Admission: RE | Admit: 2021-01-08 | Discharge: 2021-01-08 | Disposition: A | Discharge status: Home / Self Care | Payer: Medicare Other | Source: Ambulatory Visit | Attending: Gastroenterology | Admitting: Gastroenterology

## 2021-01-08 DIAGNOSIS — K7469 Other cirrhosis of liver: Secondary | ICD-10-CM

## 2021-03-28 ENCOUNTER — Other Ambulatory Visit: Payer: Self-pay | Admitting: Gastroenterology

## 2021-06-07 ENCOUNTER — Other Ambulatory Visit: Payer: Self-pay

## 2021-06-12 ENCOUNTER — Ambulatory Visit (HOSPITAL_COMMUNITY): Payer: Medicare Other | Admitting: Anesthesiology

## 2021-06-12 ENCOUNTER — Other Ambulatory Visit: Payer: Self-pay

## 2021-06-12 ENCOUNTER — Encounter (HOSPITAL_COMMUNITY): Payer: Self-pay | Admitting: Gastroenterology

## 2021-06-12 ENCOUNTER — Ambulatory Visit (HOSPITAL_COMMUNITY)
Admission: RE | Admit: 2021-06-12 | Discharge: 2021-06-12 | Disposition: A | Discharge status: Home / Self Care | Payer: Medicare Other | Attending: Gastroenterology | Admitting: Gastroenterology

## 2021-06-12 ENCOUNTER — Encounter (HOSPITAL_COMMUNITY)
Admission: RE | Disposition: A | Discharge status: Home / Self Care | Payer: Self-pay | Source: Home / Self Care | Attending: Gastroenterology

## 2021-06-12 DIAGNOSIS — K5730 Diverticulosis of large intestine without perforation or abscess without bleeding: Secondary | ICD-10-CM | POA: Insufficient documentation

## 2021-06-12 DIAGNOSIS — Z803 Family history of malignant neoplasm of breast: Secondary | ICD-10-CM | POA: Diagnosis not present

## 2021-06-12 DIAGNOSIS — Z1211 Encounter for screening for malignant neoplasm of colon: Secondary | ICD-10-CM | POA: Diagnosis present

## 2021-06-12 DIAGNOSIS — E1136 Type 2 diabetes mellitus with diabetic cataract: Secondary | ICD-10-CM | POA: Diagnosis not present

## 2021-06-12 DIAGNOSIS — Z7984 Long term (current) use of oral hypoglycemic drugs: Secondary | ICD-10-CM | POA: Insufficient documentation

## 2021-06-12 DIAGNOSIS — D123 Benign neoplasm of transverse colon: Secondary | ICD-10-CM | POA: Insufficient documentation

## 2021-06-12 DIAGNOSIS — Z853 Personal history of malignant neoplasm of breast: Secondary | ICD-10-CM | POA: Insufficient documentation

## 2021-06-12 DIAGNOSIS — K2970 Gastritis, unspecified, without bleeding: Secondary | ICD-10-CM | POA: Insufficient documentation

## 2021-06-12 DIAGNOSIS — E785 Hyperlipidemia, unspecified: Secondary | ICD-10-CM | POA: Diagnosis not present

## 2021-06-12 DIAGNOSIS — Z823 Family history of stroke: Secondary | ICD-10-CM | POA: Diagnosis not present

## 2021-06-12 DIAGNOSIS — Z8673 Personal history of transient ischemic attack (TIA), and cerebral infarction without residual deficits: Secondary | ICD-10-CM | POA: Diagnosis not present

## 2021-06-12 DIAGNOSIS — K7460 Unspecified cirrhosis of liver: Secondary | ICD-10-CM | POA: Diagnosis not present

## 2021-06-12 DIAGNOSIS — Z7982 Long term (current) use of aspirin: Secondary | ICD-10-CM | POA: Insufficient documentation

## 2021-06-12 DIAGNOSIS — K648 Other hemorrhoids: Secondary | ICD-10-CM | POA: Diagnosis not present

## 2021-06-12 DIAGNOSIS — I10 Essential (primary) hypertension: Secondary | ICD-10-CM | POA: Insufficient documentation

## 2021-06-12 DIAGNOSIS — K317 Polyp of stomach and duodenum: Secondary | ICD-10-CM | POA: Diagnosis not present

## 2021-06-12 DIAGNOSIS — D132 Benign neoplasm of duodenum: Secondary | ICD-10-CM | POA: Diagnosis not present

## 2021-06-12 DIAGNOSIS — Z923 Personal history of irradiation: Secondary | ICD-10-CM | POA: Diagnosis not present

## 2021-06-12 DIAGNOSIS — K6389 Other specified diseases of intestine: Secondary | ICD-10-CM | POA: Insufficient documentation

## 2021-06-12 DIAGNOSIS — K2289 Other specified disease of esophagus: Secondary | ICD-10-CM | POA: Diagnosis not present

## 2021-06-12 DIAGNOSIS — D120 Benign neoplasm of cecum: Secondary | ICD-10-CM | POA: Diagnosis not present

## 2021-06-12 DIAGNOSIS — Z79899 Other long term (current) drug therapy: Secondary | ICD-10-CM | POA: Insufficient documentation

## 2021-06-12 DIAGNOSIS — K209 Esophagitis, unspecified without bleeding: Secondary | ICD-10-CM | POA: Insufficient documentation

## 2021-06-12 DIAGNOSIS — Z8249 Family history of ischemic heart disease and other diseases of the circulatory system: Secondary | ICD-10-CM | POA: Insufficient documentation

## 2021-06-12 HISTORY — PX: BIOPSY: SHX5522

## 2021-06-12 HISTORY — PX: COLONOSCOPY WITH PROPOFOL: SHX5780

## 2021-06-12 HISTORY — PX: ESOPHAGOGASTRODUODENOSCOPY (EGD) WITH PROPOFOL: SHX5813

## 2021-06-12 HISTORY — PX: POLYPECTOMY: SHX5525

## 2021-06-12 SURGERY — COLONOSCOPY WITH PROPOFOL
Anesthesia: Monitor Anesthesia Care

## 2021-06-12 MED ORDER — LACTATED RINGERS IV SOLN: INTRAVENOUS | Status: DC | PRN

## 2021-06-12 MED ORDER — LACTATED RINGERS IV SOLN: Freq: Once | INTRAVENOUS | Status: AC

## 2021-06-12 MED ORDER — LIDOCAINE 2% (20 MG/ML) 5 ML SYRINGE
INTRAMUSCULAR | Status: DC | PRN
  Administered 2021-06-12: 100 mg via INTRAVENOUS

## 2021-06-12 MED ORDER — PROPOFOL 500 MG/50ML IV EMUL
INTRAVENOUS | Status: DC | PRN
  Administered 2021-06-12: 100 ug/kg/min via INTRAVENOUS

## 2021-06-12 MED ORDER — PROPOFOL 10 MG/ML IV BOLUS
INTRAVENOUS | Status: DC | PRN
  Administered 2021-06-12 (×3): 20 mg via INTRAVENOUS

## 2021-06-12 MED ORDER — PROPOFOL 500 MG/50ML IV EMUL
INTRAVENOUS | Status: AC
  Filled 2021-06-12: qty 50

## 2021-06-12 MED ORDER — SODIUM CHLORIDE 0.9 % IV SOLN: INTRAVENOUS | Status: DC

## 2021-06-12 SURGICAL SUPPLY — 25 items

## 2021-06-12 NOTE — Discharge Instructions (Signed)
YOU HAD AN ENDOSCOPIC PROCEDURE TODAY: Refer to the procedure report and other information in the discharge instructions given to you for any specific questions about what was found during the examination. If this information does not answer your questions, please call [facility] office at [phone] to clarify.   YOU SHOULD EXPECT: Some feelings of bloating in the abdomen. Passage of more gas than usual. Walking can help get rid of the air that was put into your GI tract during the procedure and reduce the bloating. If you had a lower endoscopy (such as a colonoscopy or flexible sigmoidoscopy) you may notice spotting of blood in your stool or on the toilet paper. Some abdominal soreness may be present for a day or two, also.  DIET: Your first meal following the procedure should be a light meal and then it is ok to progress to your normal diet. A half-sandwich or bowl of soup is an example of a good first meal. Heavy or fried foods are harder to digest and may make you feel nauseous or bloated. Drink plenty of fluids but you should avoid alcoholic beverages for 24 hours. If you had a esophageal dilation, please see attached instructions for diet.    ACTIVITY: Your care partner should take you home directly after the procedure. You should plan to take it easy, moving slowly for the rest of the day. You can resume normal activity the day after the procedure however YOU SHOULD NOT DRIVE, use power tools, machinery or perform tasks that involve climbing or major physical exertion for 24 hours (because of the sedation medicines used during the test).   SYMPTOMS TO REPORT IMMEDIATELY: A gastroenterologist can be reached at any hour. Please call [phone]  for any of the following symptoms:  Following lower endoscopy (colonoscopy, flexible sigmoidoscopy) Excessive amounts of blood in the stool  Significant tenderness, worsening of abdominal pains  Swelling of the abdomen that is new, acute  Fever of 100  or higher  Following upper endoscopy (EGD, EUS, ERCP, esophageal dilation) Vomiting of blood or coffee ground material  New, significant abdominal pain  New, significant chest pain or pain under the shoulder blades  Painful or persistently difficult swallowing  New shortness of breath  Black, tarry-looking or red, bloody stools  FOLLOW UP:  If any biopsies were taken you will be contacted by phone or by letter within the next 1-3 weeks. Call [phone]  if you have not heard about the biopsies in 3 weeks.  Please also call with any specific questions about appointments or follow up tests. YOU HAD AN ENDOSCOPIC PROCEDURE TODAY: Refer to the procedure report and other information in the discharge instructions given to you for any specific questions about what was found during the examination. If this information does not answer your questions, please call [facility] office at [phone] to clarify. YOU HAD AN ENDOSCOPIC PROCEDURE TODAY: Refer to the procedure report and other information in the discharge instructions given to you for any specific questions about what was found during the examination. If this information does not answer your questions, please call [facility] office at [phone] to clarify.   YOU SHOULD EXPECT: Some feelings of bloating in the abdomen. Passage of more gas than usual. Walking can help get rid of the air that was put into your GI tract during the procedure and reduce the bloating. If you had a lower endoscopy (such as a colonoscopy or flexible sigmoidoscopy) you may notice spotting of blood in your stool  or on the toilet paper. Some abdominal soreness may be present for a day or two, also.  DIET: Your first meal following the procedure should be a light meal and then it is ok to progress to your normal diet. A half-sandwich or bowl of soup is an example of a good first meal. Heavy or fried foods are harder to digest and may make you feel nauseous or bloated. Drink plenty  of fluids but you should avoid alcoholic beverages for 24 hours. If you had a esophageal dilation, please see attached instructions for diet.    ACTIVITY: Your care partner should take you home directly after the procedure. You should plan to take it easy, moving slowly for the rest of the day. You can resume normal activity the day after the procedure however YOU SHOULD NOT DRIVE, use power tools, machinery or perform tasks that involve climbing or major physical exertion for 24 hours (because of the sedation medicines used during the test).   SYMPTOMS TO REPORT IMMEDIATELY: A gastroenterologist can be reached at any hour. Please call [phone]  for any of the following symptoms:  Following lower endoscopy (colonoscopy, flexible sigmoidoscopy) Excessive amounts of blood in the stool  Significant tenderness, worsening of abdominal pains  Swelling of the abdomen that is new, acute  Fever of 100 or higher  Following upper endoscopy (EGD, EUS, ERCP, esophageal dilation) Vomiting of blood or coffee ground material  New, significant abdominal pain  New, significant chest pain or pain under the shoulder blades  Painful or persistently difficult swallowing  New shortness of breath  Black, tarry-looking or red, bloody stools  FOLLOW UP:  If any biopsies were taken you will be contacted by phone or by letter within the next 1-3 weeks. Call [phone]  if you have not heard about the biopsies in 3 weeks.  Please also call with any specific questions about appointments or follow up tests. YOU HAD AN ENDOSCOPIC PROCEDURE TODAY: Refer to the procedure report and other information in the discharge instructions given to you for any specific questions about what was found during the examination. If this information does not answer your questions, please call [facility] office at [phone] to clarify.

## 2021-06-12 NOTE — Transfer of Care (Signed)
Immediate Anesthesia Transfer of Care Note  Patient: Meagan Wallace  Procedure(s) Performed: COLONOSCOPY WITH PROPOFOL ESOPHAGOGASTRODUODENOSCOPY (EGD) WITH PROPOFOL POLYPECTOMY BIOPSY  Patient Location: Endoscopy Unit  Anesthesia Type:MAC  Level of Consciousness: awake and alert   Airway & Oxygen Therapy: Patient Spontanous Breathing and Patient connected to face mask oxygen  Post-op Assessment: Report given to RN and Post -op Vital signs reviewed and stable  Post vital signs: Reviewed and stable  Last Vitals:  Vitals Value Taken Time  BP    Temp    Pulse 76 06/12/21 1158  Resp 22 06/12/21 1158  SpO2 100 % 06/12/21 1158  Vitals shown include unvalidated device data.  Last Pain:  Vitals:   06/12/21 1034  TempSrc: Oral  PainSc: 0-No pain         Complications: No notable events documented.

## 2021-06-12 NOTE — Anesthesia Preprocedure Evaluation (Addendum)
Anesthesia Evaluation  Patient identified by MRN, date of birth, ID band Patient awake    Reviewed: Allergy & Precautions, NPO status , Patient's Chart, lab work & pertinent test results  History of Anesthesia Complications (+) PONV  Airway Mallampati: III  TM Distance: >3 FB Neck ROM: Full    Dental  (+) Missing   Pulmonary asthma ,    Pulmonary exam normal breath sounds clear to auscultation       Cardiovascular hypertension, Pt. on medications Normal cardiovascular exam Rhythm:Regular Rate:Normal     Neuro/Psych PSYCHIATRIC DISORDERS Anxiety Depression CVA, No Residual Symptoms    GI/Hepatic Neg liver ROS, Bowel prep,  Endo/Other  diabetes, Oral Hypoglycemic Agents  Renal/GU negative Renal ROS     Musculoskeletal  (+) Arthritis ,   Abdominal (+) + obese,   Peds  Hematology HLD   Anesthesia Other Findings adenoma colon polyps  Reproductive/Obstetrics                            Anesthesia Physical Anesthesia Plan  ASA: 3  Anesthesia Plan: MAC   Post-op Pain Management:    Induction: Intravenous  PONV Risk Score and Plan: 2 and Propofol infusion and Treatment may vary due to age or medical condition  Airway Management Planned: Simple Face Mask  Additional Equipment:   Intra-op Plan:   Post-operative Plan:   Informed Consent: I have reviewed the patients History and Physical, chart, labs and discussed the procedure including the risks, benefits and alternatives for the proposed anesthesia with the patient or authorized representative who has indicated his/her understanding and acceptance.     Dental advisory given  Plan Discussed with: CRNA  Anesthesia Plan Comments:        Anesthesia Quick Evaluation

## 2021-06-12 NOTE — Anesthesia Procedure Notes (Signed)
Date/Time: 06/12/2021 11:11 AM Performed by: Sharlette Dense, CRNA Oxygen Delivery Method: Simple face mask

## 2021-06-12 NOTE — H&P (Signed)
Primary Care Physician:  Charlynn Court, NP Primary Gastroenterologist:  Alessandra Bevels  Reason for Visit : Outpatient EGD and colonoscopy for cirrhosis and personal history of adenomatous polyps  HPI: Meagan Wallace is a 75 y.o. female for outpatient EGD for screening of esophageal varices and for outpatient colonoscopy for history of adenomatous polyps.  She denies any GI issues. Denies abdominal pain, nausea or vomiting. Denies blood in the stool or black stool. Denies diarrhea or constipation. Denies reflux, trouble swallowing her pain while swallowing. Had some nose bleeding last week.                 Blood work in march 2022 normal CBC,CMP and INR, normal ceruloplasmin, AMA and ASMA. Borderline low iron saturation and normal ferritin.         Ultrasound abdomen on January 08, 2021 showed cirrhosis without any mass lesion. No ascites.                Previous GI history         Patient with past medical history of hypertension, hyperlipidemia and diabetes. She underwent cholecystectomy for chronic cholecystitis in [place] in November 2021.. Given patient's history of fatty liver, she underwent liver biopsy at the time of cholecystectomy.. Path report reviewed. It showed chronic active hepatitis with advanced fibrosis and mild to moderate steatosis. Findings concerning for early active cirrhosis with a background of steatohepatitis.        Follow-up workup done by primary care physician in December 2021 showed positive hepatitis A total antibody, negative hepatitis A IgM, negative HBs antigen screen and negative hepatitis C antibody. Negative hepatitis B surface antibody and core antibody. CMP showed alkaline phosphatase of 165 otherwise normal LFTs.        Colonoscopy in January 2019 by Dr. Laurence Compton showed multiple tubular adenomas.        Repeat colonoscopy in February 2020 showed 3 small polyps and diverticulosis. Pathology showed tubular adenomas. Repeat colonoscopy was recommended in 2  years. Past Medical History:  Diagnosis Date   Allergy    Anemia    Anxiety    Arthritis    hands, knees   Asthma    Breast cancer ([place])    right breast   Cataract    bilateral- surgery to remove   Depression    Diabetes mellitus without complication (HCC)    Heart murmur    never has caused any problems per patient   Hyperlipidemia    Hypertension    Personal history of radiation therapy    PONV (postoperative nausea and vomiting)    either   Radiation 02/02/14-03/17/14   Right Breast   Stroke [facility])    at age 71 yrs old, no problems since per patient    Past Surgical History:  Procedure Laterality Date   APPENDECTOMY  1987   BREAST BIOPSY Right    BREAST LUMPECTOMY     right breast 2015   BREAST LUMPECTOMY WITH RADIOACTIVE SEED LOCALIZATION Right 12/27/2013   Procedure: BREAST LUMPECTOMY WITH RADIOACTIVE SEED LOCALIZATION;  Surgeon: Marcello Moores A. Cornett, MD;  Location: [place];  Service: General;  Laterality: Right;   COLONOSCOPY  10/27/2017   polyps/[facility]   EXPLORATORY LAPAROTOMY  1987   EYE SURGERY Bilateral    cataracts removed   SHOULDER SURGERY  2011   right    Prior to Admission medications   Medication Sig Start Date End Date Taking? Authorizing Provider  albuterol (PROVENTIL) (2.5 MG/3ML) 0.083% nebulizer solution  Take 2.5 mg by nebulization every 6 (six) hours as needed for wheezing or shortness of breath.   Yes [provider]  amLODipine (NORVASC) 10 MG tablet Take 10 mg by mouth daily. 12/01/13  Yes [provider]  Ascorbic Acid (VITAMIN C PO) Take 1 tablet by mouth daily.   Yes [provider]  aspirin 325 MG tablet Take 325 mg by mouth daily. 12/01/13  Yes [provider]  Cholecalciferol (VITAMIN D3 PO) Take 1 tablet by mouth daily.   Yes [provider]  furosemide (LASIX) 20 MG tablet TAKE 1 TABLET BY MOUTH ONCE DAILY FOR SWELLING 09/05/18  Yes [provider]  metFORMIN (GLUCOPHAGE)  500 MG tablet Take 2 tablets (1,000 mg total) by mouth 2 (two) times daily with a meal. 07/01/19  Yes Nicholas Lose, MD  Multiple Vitamins-Minerals (MULTIVITAMIN WITH MINERALS) tablet Take 1 tablet by mouth daily.   Yes [provider]  nystatin (MYCOSTATIN) powder Apply 1 application topically daily at 4 PM. 11/09/15  Yes [provider]  pravastatin (PRAVACHOL) 10 MG tablet Take 10 mg by mouth at bedtime.   Yes [provider]  ramipril (ALTACE) 10 MG capsule Take 10 mg by mouth daily.   Yes [provider]  Semaglutide,0.25 or 0.'5MG'$ /DOS, (OZEMPIC, 0.25 OR 0.5 MG/DOSE,) 2 MG/1.5ML SOPN Inject into the skin once a week. On Thursdays   Yes [provider]  venlafaxine XR (EFFEXOR-XR) 150 MG 24 hr capsule Take 150 mg by mouth daily with breakfast. 01/04/16  Yes [provider]  tamoxifen (NOLVADEX) 20 MG tablet TAKE 1 TABLET BY MOUTH EVERY DAY Patient not taking: Reported on 06/06/2021 07/05/19   Nicholas Lose, MD    Scheduled Meds: Continuous Infusions: PRN Meds:.  Allergies as of 03/28/2021   (No Known Allergies)    Family History  Problem Relation Age of Onset   Heart disease Mother    Stroke Father    Breast cancer Paternal Aunt    Colon cancer Neg Hx    Esophageal cancer Neg Hx    Rectal cancer Neg Hx    Stomach cancer Neg Hx     Social History   Socioeconomic History   Marital status: Widowed    Spouse name: Not on file   Number of children: 0   Years of education: Not on file   Highest education level: Not on file  Occupational History   Not on file  Tobacco Use   Smoking status: Never   Smokeless tobacco: Never  Vaping Use   Vaping Use: Never used  Substance and Sexual Activity   Alcohol use: No   Drug use: No   Sexual activity: Not Currently    Birth control/protection: Post-menopausal  Other Topics Concern   Not on file  Social History Narrative   Husband passed away in 01/09/2015   Social Determinants of  Health   Financial Resource Strain: Not on file  Food Insecurity: Not on file  Transportation Needs: Not on file  Physical Activity: Not on file  Stress: Not on file  Social Connections: Not on file  Intimate Partner Violence: Not on file    Review of Systems: All negative except as stated above in HPI.  Physical Exam: Vital signs: There were no vitals filed for this visit.   General:   Alert,  Well-developed, well-nourished, pleasant and cooperative in NAD Lungs: No visible respiratory distress.  Anterior exam only Heart:  Regular rate and rhythm; no murmurs, clicks, rubs,  or gallops. Abdomen: Abdomen is mildly distended, soft, bowel sounds present, no peritoneal signs Rectal:  Deferred  GI:  Lab Results: No results for input(s): WBC, HGB, HCT, PLT in the last 72 hours. BMET No results for input(s): NA, K, CL, CO2, GLUCOSE, BUN, CREATININE, CALCIUM in the last 72 hours. LFT No results for input(s): PROT, ALBUMIN, AST, ALT, ALKPHOS, BILITOT, BILIDIR, IBILI in the last 72 hours. PT/INR No results for input(s): LABPROT, INR in the last 72 hours.   Studies/Results: No results found.  Impression/Plan: -Cirrhosis of the liver -History of adenomatous polyp  Recommendations ----------------------- -Proceed with EGD with possible band ligation and colonoscopy today.  Risks (bleeding, infection, bowel perforation that could require surgery, sedation-related changes in cardiopulmonary systems), benefits (identification and possible treatment of source of symptoms, exclusion of certain causes of symptoms), and alternatives (watchful waiting, radiographic imaging studies, empiric medical treatment)  were explained to patient in detail and patient wishes to proceed.     LOS: 0 days   Otis Brace  MD, FACP 06/12/2021, 10:16 AM  Contact #  [phone]

## 2021-06-12 NOTE — Op Note (Signed)
[facility] Patient Name: Meagan Wallace Procedure Date: 06/12/2021 MRN: [id number] Attending MD: Otis Brace , MD Date of Birth: August 13, 1946 CSN: [id number] Age: 75 Admit Type: Outpatient Procedure:                Colonoscopy Indications:              Surveillance: History of numerous (> 10) adenomas ,                            last colonoscopy (< 3 yrs), Last colonoscopy:                            February 2020 Providers:                Otis Brace, MD, Kary Kos RN, RN, Cherylynn Ridges, Technician, Danley Danker, CRNA Referring MD:              Medicines:                Sedation Administered by an Anesthesia Professional Complications:            No immediate complications. Estimated Blood Loss:     Estimated blood loss was minimal. Procedure:                Pre-Anesthesia Assessment:                           - Prior to the procedure, a History and Physical                            was performed, and patient medications and                            allergies were reviewed. The patient's tolerance of                            previous anesthesia was also reviewed. The risks                            and benefits of the procedure and the sedation                            options and risks were discussed with the patient.                            All questions were answered, and informed consent                            was obtained. Prior Anticoagulants: The patient has                            taken no previous anticoagulant or antiplatelet  agents. ASA Grade Assessment: III - A patient with                            severe systemic disease. After reviewing the risks                            and benefits, the patient was deemed in                            satisfactory condition to undergo the procedure.                           After obtaining informed consent, the colonoscope                             was passed under direct vision. Throughout the                            procedure, the patient's blood pressure, pulse, and                            oxygen saturations were monitored continuously. The                            [id number]) Olympus colonoscope was                            introduced through the anus and advanced to the the                            cecum, identified by appendiceal orifice and                            ileocecal valve. The colonoscopy was performed                            without difficulty. The patient tolerated the                            procedure well. The quality of the bowel                            preparation was adequate to identify polyps 6 mm                            and larger in size. Scope In: 11:26:18 AM Scope Out: 11:50:04 AM Scope Withdrawal Time: 0 hours 17 minutes 17 seconds  Total Procedure Duration: 0 hours 23 minutes 46 seconds  Findings:      The perianal and digital rectal examinations were normal.      A 5 mm polyp was found in the cecum. The polyp was sessile. The polyp       was removed with a cold snare. Resection and retrieval were complete.      A 7 mm polyp was  found in the hepatic flexure. The polyp was sessile.       The polyp was removed with a cold snare. Resection was complete, but the       polyp tissue was not retrieved.      A 4 mm polyp was found in the transverse colon. The polyp was sessile.       The polyp was removed with a cold snare. Resection and retrieval were       complete.      A few small-mouthed diverticula were found in the sigmoid colon.      A scattered area of mildly erythematous mucosa was found in the       transverse colon and in the ascending colon.      Internal hemorrhoids were found during retroflexion. The hemorrhoids       were small. Impression:               - One 5 mm polyp in the cecum, removed with a cold                             snare. Resected and retrieved.                           - One 7 mm polyp at the hepatic flexure, removed                            with a cold snare. Complete resection. Polyp tissue                            not retrieved.                           - One 4 mm polyp in the transverse colon, removed                            with a cold snare. Resected and retrieved.                           - Diverticulosis in the sigmoid colon.                           - Erythematous mucosa in the transverse colon and                            in the ascending colon.                           - Internal hemorrhoids. Moderate Sedation:      Moderate (conscious) sedation was personally administered by an       anesthesia professional. The following parameters were monitored: oxygen       saturation, heart rate, blood pressure, and response to care. Recommendation:           - Patient has a contact number available for                            emergencies. The signs and symptoms of potential  delayed complications were discussed with the                            patient. Return to normal activities tomorrow.                            Written discharge instructions were provided to the                            patient.                           - Resume previous diet.                           - Continue present medications.                           - Await pathology results.                           - Repeat colonoscopy in 3 years for surveillance                            based on pathology results.                           - Return to GI office as previously scheduled. Procedure Code(s):        --- Professional ---                           [phone], Colonoscopy, flexible; with removal of                            tumor(s), polyp(s), or other lesion(s) by snare                            technique Diagnosis Code(s):        --- Professional ---                            Z86.010, Personal history of colonic polyps                           K63.5, Polyp of colon                           K64.8, Other hemorrhoids                           K63.89, Other specified diseases of intestine                           K57.30, Diverticulosis of large intestine without                            perforation or abscess without bleeding CPT copyright 2019 American  Medical Association. All rights reserved. The codes documented in this report are preliminary and upon coder review may  be revised to meet current compliance requirements. Otis Brace, MD Otis Brace, MD 06/12/2021 12:01:23 PM Number of Addenda: 0

## 2021-06-12 NOTE — Anesthesia Postprocedure Evaluation (Signed)
Anesthesia Post Note  Patient: LEONIA HEATHERLY  Procedure(s) Performed: COLONOSCOPY WITH PROPOFOL ESOPHAGOGASTRODUODENOSCOPY (EGD) WITH PROPOFOL POLYPECTOMY BIOPSY     Patient location during evaluation: Endoscopy Anesthesia Type: MAC Level of consciousness: awake Pain management: pain level controlled Vital Signs Assessment: post-procedure vital signs reviewed and stable Respiratory status: spontaneous breathing, nonlabored ventilation, respiratory function stable and patient connected to nasal cannula oxygen Cardiovascular status: stable and blood pressure returned to baseline Postop Assessment: no apparent nausea or vomiting Anesthetic complications: no   No notable events documented.  Last Vitals:  Vitals:   06/12/21 1208 06/12/21 1220  BP: (!) 164/86 (!) 173/81  Pulse: 72 73  Resp: (!) 22 20  Temp:    SpO2: 96% 97%    Last Pain:  Vitals:   06/12/21 1220  TempSrc:   PainSc: 0-No pain                 Camaya Gannett P Azalie Harbeck

## 2021-06-12 NOTE — Op Note (Signed)
[facility] Patient Name: Meagan Wallace Procedure Date: 06/12/2021 MRN: [id number] Attending MD: Otis Brace , MD Date of Birth: 1946/04/13 CSN: [id number] Age: 75 Admit Type: Outpatient Procedure:                Upper GI endoscopy Indications:              Cirrhosis rule out esophageal varices Providers:                Otis Brace, MD, Kary Kos RN, RN, Cherylynn Ridges, Technician, Danley Danker, CRNA Referring MD:              Medicines:                Sedation Administered by an Anesthesia Professional Complications:            No immediate complications. Estimated Blood Loss:     Estimated blood loss was minimal. Procedure:                Pre-Anesthesia Assessment:                           - Prior to the procedure, a History and Physical                            was performed, and patient medications and                            allergies were reviewed. The patient's tolerance of                            previous anesthesia was also reviewed. The risks                            and benefits of the procedure and the sedation                            options and risks were discussed with the patient.                            All questions were answered, and informed consent                            was obtained. Prior Anticoagulants: The patient has                            taken no previous anticoagulant or antiplatelet                            agents. ASA Grade Assessment: III - A patient with                            severe systemic disease. After reviewing the risks  and benefits, the patient was deemed in                            satisfactory condition to undergo the procedure.                           After obtaining informed consent, the endoscope was                            passed under direct vision. Throughout the                            procedure, the  patient's blood pressure, pulse, and                            oxygen saturations were monitored continuously. The                            [id number] Olympus endoscope was introduced                            through the mouth, and advanced to the second part                            of duodenum. The upper GI endoscopy was                            accomplished without difficulty. The patient                            tolerated the procedure well. Scope In: Scope Out: Findings:      Scattered mild mucosal changes characterized by inflammation and altered       texture were found in the distal esophagus. Biopsies were taken with a       cold forceps for histology.      There is no endoscopic evidence of varices in the entire esophagus.      A large amount of food (residue) was found in the gastric body.      Scattered mild inflammation characterized by congestion (edema) and       erythema was found in the entire examined stomach.      A single 5 mm sessile polyp with no bleeding was found in the second       portion of the duodenum. The polyp was removed with a cold snare.       Resection and retrieval were complete.      The duodenal bulb and first portion of the duodenum were normal. Impression:               - Inflamed, texture changed mucosa in the                            esophagus. Biopsied.                           - A large amount of food (residue) in the stomach.                           -  Gastritis.                           - A single duodenal polyp. Resected and retrieved.                           - Normal duodenal bulb and first portion of the                            duodenum. Moderate Sedation:      Moderate (conscious) sedation was personally administered by an       anesthesia professional. The following parameters were monitored: oxygen       saturation, heart rate, blood pressure, and response to care. Recommendation:           - Patient has  a contact number available for                            emergencies. The signs and symptoms of potential                            delayed complications were discussed with the                            patient. Return to normal activities tomorrow.                            Written discharge instructions were provided to the                            patient.                           - Resume previous diet.                           - Continue present medications.                           - Await pathology results.                           - Perform a colonoscopy today. Procedure Code(s):        --- Professional ---                           [phone], Esophagogastroduodenoscopy, flexible,                            transoral; with removal of tumor(s), polyp(s), or                            other lesion(s) by snare technique                           43239, 59, Esophagogastroduodenoscopy, flexible,  transoral; with biopsy, single or multiple Diagnosis Code(s):        --- Professional ---                           K20.90, Esophagitis, unspecified without bleeding                           K22.8, Other specified diseases of esophagus                           K31.7, Polyp of stomach and duodenum                           K74.60, Unspecified cirrhosis of liver CPT copyright 2019 American [employer] Association. All rights reserved. The codes documented in this report are preliminary and upon coder review may  be revised to meet current compliance requirements. Otis Brace, MD Otis Brace, MD 06/12/2021 11:56:18 AM Number of Addenda: 0

## 2021-06-13 LAB — SURGICAL PATHOLOGY

## 2021-06-15 ENCOUNTER — Encounter (HOSPITAL_COMMUNITY): Payer: Self-pay | Admitting: Gastroenterology

## 2021-07-06 ENCOUNTER — Other Ambulatory Visit: Payer: Self-pay | Admitting: Gastroenterology

## 2021-07-06 DIAGNOSIS — K7469 Other cirrhosis of liver: Secondary | ICD-10-CM

## 2021-07-12 ENCOUNTER — Other Ambulatory Visit: Payer: Self-pay

## 2021-07-12 ENCOUNTER — Ambulatory Visit
Admission: RE | Admit: 2021-07-12 | Discharge: 2021-07-12 | Disposition: A | Discharge status: Home / Self Care | Payer: Medicare Other | Source: Ambulatory Visit | Attending: Gastroenterology | Admitting: Gastroenterology

## 2021-07-12 DIAGNOSIS — K7469 Other cirrhosis of liver: Secondary | ICD-10-CM

## 2021-08-04 ENCOUNTER — Other Ambulatory Visit: Payer: Self-pay

## 2021-08-04 ENCOUNTER — Emergency Department (HOSPITAL_COMMUNITY)
Admission: EM | Admit: 2021-08-04 | Discharge: 2021-08-04 | Disposition: A | Discharge status: Home / Self Care | Payer: Medicare Other | Attending: Emergency Medicine | Admitting: Emergency Medicine

## 2021-08-04 ENCOUNTER — Emergency Department (HOSPITAL_COMMUNITY): Payer: Medicare Other

## 2021-08-04 ENCOUNTER — Encounter (HOSPITAL_COMMUNITY): Payer: Self-pay | Admitting: Emergency Medicine

## 2021-08-04 DIAGNOSIS — I10 Essential (primary) hypertension: Secondary | ICD-10-CM | POA: Insufficient documentation

## 2021-08-04 DIAGNOSIS — Z7984 Long term (current) use of oral hypoglycemic drugs: Secondary | ICD-10-CM | POA: Insufficient documentation

## 2021-08-04 DIAGNOSIS — S6991XA Unspecified injury of right wrist, hand and finger(s), initial encounter: Secondary | ICD-10-CM | POA: Diagnosis present

## 2021-08-04 DIAGNOSIS — W109XXA Fall (on) (from) unspecified stairs and steps, initial encounter: Secondary | ICD-10-CM | POA: Diagnosis not present

## 2021-08-04 DIAGNOSIS — Y9301 Activity, walking, marching and hiking: Secondary | ICD-10-CM | POA: Insufficient documentation

## 2021-08-04 DIAGNOSIS — S52501A Unspecified fracture of the lower end of right radius, initial encounter for closed fracture: Secondary | ICD-10-CM | POA: Insufficient documentation

## 2021-08-04 DIAGNOSIS — Z79899 Other long term (current) drug therapy: Secondary | ICD-10-CM | POA: Insufficient documentation

## 2021-08-04 DIAGNOSIS — E119 Type 2 diabetes mellitus without complications: Secondary | ICD-10-CM | POA: Insufficient documentation

## 2021-08-04 DIAGNOSIS — J45909 Unspecified asthma, uncomplicated: Secondary | ICD-10-CM | POA: Diagnosis not present

## 2021-08-04 DIAGNOSIS — S0990XA Unspecified injury of head, initial encounter: Secondary | ICD-10-CM | POA: Diagnosis not present

## 2021-08-04 DIAGNOSIS — Z853 Personal history of malignant neoplasm of breast: Secondary | ICD-10-CM | POA: Insufficient documentation

## 2021-08-04 DIAGNOSIS — Z7982 Long term (current) use of aspirin: Secondary | ICD-10-CM | POA: Diagnosis not present

## 2021-08-04 DIAGNOSIS — S52614A Nondisplaced fracture of right ulna styloid process, initial encounter for closed fracture: Secondary | ICD-10-CM | POA: Diagnosis not present

## 2021-08-04 MED ORDER — OXYCODONE-ACETAMINOPHEN 5-325 MG PO TABS
1.0000 | ORAL_TABLET | Freq: Once | ORAL | Status: AC
  Administered 2021-08-04: 1 via ORAL
  Filled 2021-08-04: qty 1

## 2021-08-04 MED ORDER — OXYCODONE-ACETAMINOPHEN 5-325 MG PO TABS: 1.0000 | ORAL_TABLET | Freq: Four times a day (QID) | ORAL | 0 refills | Status: DC | PRN

## 2021-08-04 MED ORDER — FENTANYL CITRATE PF 50 MCG/ML IJ SOSY
50.0000 ug | PREFILLED_SYRINGE | Freq: Once | INTRAMUSCULAR | Status: AC
Start: 2021-08-04 — End: 2021-08-04
  Administered 2021-08-04: 50 ug via INTRAMUSCULAR
  Filled 2021-08-04: qty 1

## 2021-08-04 NOTE — Discharge Instructions (Signed)
Please follow up with Dr. Fredna Dow for further evaluation of your wrist fractures  Keep splint on until you can see him. DO NOT GET SPLINT WET.  While at home please elevate your wrist as much as possible.   Take the pain medication as needed for severe pain.   Return to the ED for any new/worsening symptoms

## 2021-08-04 NOTE — ED Provider Notes (Signed)
[facility] EMERGENCY DEPARTMENT Provider Note   CSN: [id number] Arrival date & time: 08/04/21  0013     History Chief Complaint  Patient presents with   Fall / Wrist Injury    Meagan Wallace is a 75 y.o. female who presents to the ED today s/p fall that occurred last night. Pt reports she was walking up the stairs and was on the 3rd step from the bottom when she lost her footing and fell. She attempted to catch herself with her outstretched right hand however fell onto it. She complains of immediate pain to the wrist. She also mentions hitting her forehead in the process - no LOC and is not anticoagulated. Pt is right hand dominant. No other complaints at this time.   The history is provided by the patient and medical records.      Past Medical History:  Diagnosis Date   Allergy    Anemia    Anxiety    Arthritis    hands, knees   Asthma    Breast cancer ([place])    right breast   Cataract    bilateral- surgery to remove   Depression    Diabetes mellitus without complication (HCC)    Heart murmur    never has caused any problems per patient   Hyperlipidemia    Hypertension    Personal history of radiation therapy    PONV (postoperative nausea and vomiting)    either   Radiation 02/02/14-03/17/14   Right Breast   Stroke [facility])    at age 76 yrs old, no problems since per patient    Patient Active Problem List   Diagnosis Date Noted   Fibroadenoma of right breast 08/02/2016   High risk medication use 01/23/2016   Chronic depression 10/06/2014   Menopausal syndrome (hot flashes) 10/06/2014   Osteopenia determined by x-ray 10/06/2014   History of breast cancer 04/22/2014   Type II or unspecified type diabetes mellitus without mention of complication, not stated as uncontrolled 03/19/2014   Obesity 03/19/2014   Osteopenia 03/19/2014   CVA (cerebral infarction) 03/19/2014   HTN (hypertension) 03/19/2014   DCIS (ductal carcinoma in situ) 12/01/2013    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 Cornett, MD;  Location: [place];  Service: General;  Laterality: Right;   COLONOSCOPY  10/27/2017   polyps/Danis   COLONOSCOPY WITH PROPOFOL N/A 06/12/2021   Procedure: COLONOSCOPY WITH PROPOFOL;  Surgeon: Otis Brace, MD;  Location: [facility];  Service: Gastroenterology;  Laterality: N/A;   ESOPHAGOGASTRODUODENOSCOPY (EGD) WITH PROPOFOL N/A 06/12/2021   Procedure: ESOPHAGOGASTRODUODENOSCOPY (EGD) WITH PROPOFOL;  Surgeon: Otis Brace, MD;  Location: [facility];  Service: Gastroenterology;  Laterality: N/A;   EXPLORATORY LAPAROTOMY  1987   EYE SURGERY Bilateral    cataracts removed   POLYPECTOMY  06/12/2021   Procedure: POLYPECTOMY;  Surgeon: Otis Brace, MD;  Location: [facility];  Service: Gastroenterology;;   SHOULDER SURGERY  2011   right     OB History   No obstetric history on  file.     Family History  Problem Relation Age of Onset   Heart disease Mother    Stroke Father    Breast cancer Paternal Aunt    Colon cancer Neg Hx    Esophageal cancer Neg Hx    Rectal cancer Neg Hx    Stomach cancer Neg Hx     Social History   Tobacco Use   Smoking status: Never   Smokeless tobacco: Never  Vaping Use   Vaping Use: Never used  Substance Use Topics   Alcohol use: No   Drug use: No    Home Medications Prior to Admission medications   Medication Sig Start Date End Date Taking? Authorizing Provider  albuterol (PROVENTIL) (2.5 MG/3ML) 0.083%  nebulizer solution Take 2.5 mg by nebulization every 6 (six) hours as needed for wheezing or shortness of breath.   Yes [provider]  amLODipine (NORVASC) 10 MG tablet Take 10 mg by mouth daily. 12/01/13  Yes [provider]  Ascorbic Acid (VITAMIN C PO) Take 1 tablet by mouth daily.   Yes [provider]  aspirin 325 MG tablet Take 325 mg by mouth daily. 12/01/13  Yes [provider]  Cholecalciferol (VITAMIN D3 PO) Take 1 tablet by mouth daily.   Yes [provider]  Cyanocobalamin (VITAMIN B 12 PO) Take 1 tablet by mouth daily.   Yes [provider]  furosemide (LASIX) 20 MG tablet Take 20 mg by mouth daily. 09/05/18  Yes [provider]  ibuprofen (ADVIL) 200 MG tablet Take 400 mg by mouth every 6 (six) hours as needed for headache or moderate pain.   Yes [provider]  metFORMIN (GLUCOPHAGE) 500 MG tablet Take 2 tablets (1,000 mg total) by mouth 2 (two) times daily with a meal. 07/01/19  Yes Nicholas Lose, MD  Multiple Vitamins-Minerals (MULTIVITAMIN WITH MINERALS) tablet Take 1 tablet by mouth daily.   Yes [provider]  oxyCODONE-acetaminophen (PERCOCET/ROXICET) 5-325 MG tablet Take 1 tablet by mouth every 6 (six) hours as needed for severe pain. 08/04/21  Yes Jazon Jipson, PA-C  pravastatin (PRAVACHOL) 10 MG tablet Take 10 mg by mouth at bedtime.   Yes [provider]  ramipril (ALTACE) 10 MG capsule Take 10 mg by mouth daily.   Yes [provider]  Semaglutide,0.25 or 0.5MG /DOS, (OZEMPIC, 0.25 OR 0.5 MG/DOSE,) 2 MG/1.5ML SOPN Inject 0.5 mg into the skin every Thursday.   Yes [provider]  venlafaxine XR (EFFEXOR-XR) 150 MG 24 hr capsule Take 150 mg by mouth daily with breakfast. 01/04/16  Yes [provider]  tamoxifen (NOLVADEX) 20 MG tablet TAKE 1 TABLET BY MOUTH EVERY DAY Patient not taking: No sig reported 07/05/19   Nicholas Lose, MD    Allergies    Patient  has no known allergies.  Review of Systems   Review of Systems  Constitutional:  Negative for chills and fever.  Musculoskeletal:  Positive for arthralgias.  Neurological:  Positive for headaches. Negative for syncope.  All other systems reviewed and are negative.  Physical Exam Updated Vital Signs BP (!) 157/82   Pulse 73   Temp 98.3 F (36.8 C)   Resp 15   Ht 5\' 5"  (1.651 m)   Wt 113 kg   SpO2 97%   BMI 41.46 kg/m   Physical Exam Vitals and nursing note reviewed.  Constitutional:      Appearance: She is not ill-appearing or diaphoretic.  HENT:     Head: Normocephalic and  atraumatic.  Eyes:     Conjunctiva/sclera: Conjunctivae normal.  Cardiovascular:     Rate and Rhythm: Normal rate and regular rhythm.     Pulses: Normal pulses.  Pulmonary:     Effort: Pulmonary effort is normal.     Breath sounds: Normal breath sounds. No wheezing, rhonchi or rales.  Abdominal:     Palpations: Abdomen is soft.     Tenderness: There is no abdominal tenderness.  Musculoskeletal:     Cervical back: Neck supple.     Comments: Slight deformity noted to R wrist with surrounding swelling and ecchymosis. Diffusely TTP to the right wrist and right forearm. ROM limited at wrist s/2 pain. Able to wiggle all fingers without difficulty. Cap refill < 2 seconds to all fingers. 2+ radial pulse.   Skin:    General: Skin is warm and dry.  Neurological:     Mental Status: She is alert.    ED Results / Procedures / Treatments   Labs (all labs ordered are listed, but only abnormal results are displayed) Labs Reviewed - No data to display  EKG None  Radiology DG Forearm Right  Result Date: 08/04/2021 CLINICAL DATA:  Fall. EXAM: RIGHT WRIST - COMPLETE 3+ VIEW; RIGHT FOREARM - 2 VIEW COMPARISON:  None. FINDINGS: Comminuted appearing fracture of the distal radius with a transverse fracture component and a longitudinal fracture extending into the articular surface. Minimally displaced fracture of  the ulnar styloid. The bones are osteopenic. There is no dislocation. There is soft tissue swelling of the wrist. No radiopaque foreign object or soft tissue gas. IMPRESSION: Comminuted fracture of the distal radius and minimally displaced fracture of the ulnar styloid. Electronically Signed   By: Anner Crete M.D.   On: 08/04/2021 01:32   DG Wrist Complete Right  Result Date: 08/04/2021 CLINICAL DATA:  Fall. EXAM: RIGHT WRIST - COMPLETE 3+ VIEW; RIGHT FOREARM - 2 VIEW COMPARISON:  None. FINDINGS: Comminuted appearing fracture of the distal radius with a transverse fracture component and a longitudinal fracture extending into the articular surface. Minimally displaced fracture of the ulnar styloid. The bones are osteopenic. There is no dislocation. There is soft tissue swelling of the wrist. No radiopaque foreign object or soft tissue gas. IMPRESSION: Comminuted fracture of the distal radius and minimally displaced fracture of the ulnar styloid. Electronically Signed   By: Anner Crete M.D.   On: 08/04/2021 01:32   CT Head Wo Contrast  Result Date: 08/04/2021 CLINICAL DATA:  Facial trauma, hip forehead EXAM: CT HEAD WITHOUT CONTRAST TECHNIQUE: Contiguous axial images were obtained from the base of the skull through the vertex without intravenous contrast. COMPARISON:  08/11/2015. FINDINGS: Brain: No evidence of acute infarction, hemorrhage, hydrocephalus, extra-axial collection or mass lesion/mass effect. Periventricular white matter changes, likely the sequela of chronic small vessel ischemic disease. Vascular: No hyperdense vessel or unexpected calcification. Skull: Normal. Negative for fracture or focal lesion. Sinuses/Orbits: Mucosal thickening in the right maxillary sinus, ethmoid air cells, and sphenoid sinuses. Status post bilateral lens replacements. Other: The mastoids are well aerated. IMPRESSION: No acute intracranial process. Electronically Signed   By: Merilyn Baba M.D.   On:  08/04/2021 02:02    Procedures Procedures   Medications Ordered in ED Medications  oxyCODONE-acetaminophen (PERCOCET/ROXICET) 5-325 MG per tablet 1 tablet (1 tablet Oral Given 08/04/21 0429)  fentaNYL (SUBLIMAZE) injection 50 mcg (50 mcg Intramuscular Given 08/04/21 0702)    ED Course  I have reviewed the triage vital signs and the nursing notes.  Pertinent labs & imaging results that were available during my care of the patient were reviewed by me and considered in my medical decision making (see chart for details).    MDM Rules/Calculators/A&P                           75 year old female who presents to the ED today status post mechanical fall while walking up steps last night.  Fell onto right outstretched wrist and has been complaining of pain to this area since then.  On arrival to the ED patient's blood pressure elevated 178/121 with repeat 177/89.  She was medically screened in triage and x-rays of the wrist, forearm as well as CT head ordered as she hit her head without loss of consciousness.  Is not anticoagulated.  X-ray of the wrist has returned with comminuted fracture of the distal radius and ulnar styloid.  On my exam she has deformity to the right wrist with surrounding swelling and ecchymosis with tenderness palpation.  She has neurovascularly intact.  She has no other complaints at this time.  We will plan for sugar-tong splint and additional pain medication.  Patient to follow-up with orthopedics for further eval.  Splint applied. Will discharge home at this time. Pt in agreement with plan.   This note was prepared using Dragon voice recognition software and may include unintentional dictation errors due to the inherent limitations of voice recognition software.   Final Clinical Impression(s) / ED Diagnoses Final diagnoses:  Closed fracture of distal end of right radius, unspecified fracture morphology, initial encounter  Closed nondisplaced fracture of styloid  process of right ulna, initial encounter    Rx / DC Orders ED Discharge Orders          Ordered    oxyCODONE-acetaminophen (PERCOCET/ROXICET) 5-325 MG tablet  Every 6 hours PRN        08/04/21 0744             Discharge Instructions      Please follow up with Dr. Fredna Dow for further evaluation of your wrist fractures  Keep splint on until you can see him. DO NOT GET SPLINT WET.  While at home please elevate your wrist as much as possible.   Take the pain medication as needed for severe pain.   Return to the ED for any new/worsening symptoms       Eustaquio Maize, PA-C 08/04/21 0745    Malvin Johns, MD 08/04/21 [phone]

## 2021-08-04 NOTE — ED Provider Notes (Signed)
Emergency Medicine Provider Triage Evaluation Note  Meagan Wallace , a 75 y.o. female  was evaluated in triage.  Pt complains of fall.  Patient reports that she was walking down the stairs holding the railing with her left hand when she lost her footing and slipped.  She hit her right hand and forearm on the corner of one of the stairs.  She also bumped her forehead.  She did not lose consciousness.  She is endorsing right wrist and forearm pain.  She has a mild headache.  No neck pain, loss of consciousness, numbness, weakness, visual changes, chest pain, abdominal pain, shortness of breath, vomiting.  She has a history of cirrhosis.  She takes a baby aspirin daily, but no other blood thinners.  Review of Systems  Positive: Arthralgias, myalgias, headache Negative: Chest pain, abdominal pain, visual changes, numbness, weakness, loss of consciousness  Physical Exam  BP (!) 178/121 (BP Location: Left Arm)   Pulse 90   Temp 98 F (36.7 C) (Oral)   Resp 16   SpO2 94%  Gen:   Awake, no distress   Resp:  Normal effort  MSK:   Moves extremities without difficulty  Other:  Obvious deformity to the right wrist.  She is tender to palpation to the proximal right forearm.  Right elbow and shoulder nontender.  Spine is nontender.  No trauma noted to the face.  Medical Decision Making  Medically screening exam initiated at 12:44 AM.  Appropriate orders placed.  Meagan Wallace was informed that the remainder of the evaluation will be completed by another provider, this initial triage assessment does not replace that evaluation, and the importance of remaining in the ED until their evaluation is complete.  Imaging has been ordered.  She will require further work-up and evaluation in the emergency department.   Joanne Gavel, PA-C 08/04/21 [place], April, MD 08/04/21 [phone]

## 2021-08-04 NOTE — Progress Notes (Signed)
Orthopedic Tech Progress Note Patient Details:  Meagan Wallace 08/12/1946 [id number]  Ortho Devices Type of Ortho Device: Arm sling, Sugartong splint Ortho Device/Splint Location: RUE Ortho Device/Splint Interventions: Ordered, Application, Adjustment   Post Interventions Patient Tolerated: Well Instructions Provided: Care of device, Adjustment of device, Poper ambulation with device  [name] 08/04/2021, 7:19 AM

## 2021-08-04 NOTE — ED Triage Notes (Signed)
Patient slipped and fell on wet grass this evening , no LOC , reports pain at right wrist with mild swelling .

## 2021-11-20 ENCOUNTER — Emergency Department (HOSPITAL_COMMUNITY): Payer: Medicare Other

## 2021-11-20 ENCOUNTER — Inpatient Hospital Stay (HOSPITAL_COMMUNITY)
Admission: EM | Admit: 2021-11-20 | Discharge: 2021-11-23 | DRG: 071 | Disposition: A | Discharge status: Skilled Nursing Facility | Payer: Medicare Other | Attending: Internal Medicine | Admitting: Internal Medicine

## 2021-11-20 ENCOUNTER — Inpatient Hospital Stay (HOSPITAL_COMMUNITY): Payer: Medicare Other

## 2021-11-20 ENCOUNTER — Encounter (HOSPITAL_COMMUNITY): Payer: Self-pay

## 2021-11-20 DIAGNOSIS — Z20822 Contact with and (suspected) exposure to covid-19: Secondary | ICD-10-CM | POA: Diagnosis present

## 2021-11-20 DIAGNOSIS — Z8673 Personal history of transient ischemic attack (TIA), and cerebral infarction without residual deficits: Secondary | ICD-10-CM

## 2021-11-20 DIAGNOSIS — Z79899 Other long term (current) drug therapy: Secondary | ICD-10-CM

## 2021-11-20 DIAGNOSIS — N179 Acute kidney failure, unspecified: Secondary | ICD-10-CM | POA: Diagnosis present

## 2021-11-20 DIAGNOSIS — E119 Type 2 diabetes mellitus without complications: Secondary | ICD-10-CM | POA: Diagnosis present

## 2021-11-20 DIAGNOSIS — E861 Hypovolemia: Secondary | ICD-10-CM | POA: Diagnosis present

## 2021-11-20 DIAGNOSIS — E860 Dehydration: Secondary | ICD-10-CM | POA: Diagnosis present

## 2021-11-20 DIAGNOSIS — R197 Diarrhea, unspecified: Secondary | ICD-10-CM | POA: Diagnosis not present

## 2021-11-20 DIAGNOSIS — Z6841 Body Mass Index (BMI) 40.0 and over, adult: Secondary | ICD-10-CM

## 2021-11-20 DIAGNOSIS — L89151 Pressure ulcer of sacral region, stage 1: Secondary | ICD-10-CM | POA: Diagnosis present

## 2021-11-20 DIAGNOSIS — G9349 Other encephalopathy: Secondary | ICD-10-CM | POA: Diagnosis not present

## 2021-11-20 DIAGNOSIS — E871 Hypo-osmolality and hyponatremia: Secondary | ICD-10-CM | POA: Diagnosis not present

## 2021-11-20 DIAGNOSIS — N3000 Acute cystitis without hematuria: Secondary | ICD-10-CM | POA: Diagnosis not present

## 2021-11-20 DIAGNOSIS — Z823 Family history of stroke: Secondary | ICD-10-CM

## 2021-11-20 DIAGNOSIS — A419 Sepsis, unspecified organism: Secondary | ICD-10-CM

## 2021-11-20 DIAGNOSIS — Z8249 Family history of ischemic heart disease and other diseases of the circulatory system: Secondary | ICD-10-CM

## 2021-11-20 DIAGNOSIS — Z7401 Bed confinement status: Secondary | ICD-10-CM | POA: Diagnosis not present

## 2021-11-20 DIAGNOSIS — D649 Anemia, unspecified: Secondary | ICD-10-CM

## 2021-11-20 DIAGNOSIS — Z803 Family history of malignant neoplasm of breast: Secondary | ICD-10-CM | POA: Diagnosis not present

## 2021-11-20 DIAGNOSIS — I4891 Unspecified atrial fibrillation: Secondary | ICD-10-CM | POA: Diagnosis not present

## 2021-11-20 DIAGNOSIS — I959 Hypotension, unspecified: Secondary | ICD-10-CM | POA: Diagnosis present

## 2021-11-20 DIAGNOSIS — D696 Thrombocytopenia, unspecified: Secondary | ICD-10-CM | POA: Diagnosis present

## 2021-11-20 DIAGNOSIS — F32A Depression, unspecified: Secondary | ICD-10-CM | POA: Diagnosis present

## 2021-11-20 DIAGNOSIS — Z923 Personal history of irradiation: Secondary | ICD-10-CM

## 2021-11-20 DIAGNOSIS — D0511 Intraductal carcinoma in situ of right breast: Secondary | ICD-10-CM | POA: Diagnosis present

## 2021-11-20 DIAGNOSIS — L89322 Pressure ulcer of left buttock, stage 2: Secondary | ICD-10-CM | POA: Diagnosis present

## 2021-11-20 DIAGNOSIS — Z7984 Long term (current) use of oral hypoglycemic drugs: Secondary | ICD-10-CM

## 2021-11-20 DIAGNOSIS — G9340 Encephalopathy, unspecified: Secondary | ICD-10-CM

## 2021-11-20 DIAGNOSIS — I10 Essential (primary) hypertension: Secondary | ICD-10-CM | POA: Diagnosis present

## 2021-11-20 DIAGNOSIS — N3001 Acute cystitis with hematuria: Secondary | ICD-10-CM | POA: Diagnosis present

## 2021-11-20 DIAGNOSIS — D509 Iron deficiency anemia, unspecified: Secondary | ICD-10-CM | POA: Diagnosis present

## 2021-11-20 DIAGNOSIS — E7220 Disorder of urea cycle metabolism, unspecified: Secondary | ICD-10-CM

## 2021-11-20 DIAGNOSIS — D72829 Elevated white blood cell count, unspecified: Secondary | ICD-10-CM

## 2021-11-20 DIAGNOSIS — I4819 Other persistent atrial fibrillation: Secondary | ICD-10-CM | POA: Diagnosis present

## 2021-11-20 DIAGNOSIS — K7460 Unspecified cirrhosis of liver: Secondary | ICD-10-CM

## 2021-11-20 DIAGNOSIS — F419 Anxiety disorder, unspecified: Secondary | ICD-10-CM | POA: Diagnosis present

## 2021-11-20 DIAGNOSIS — E876 Hypokalemia: Secondary | ICD-10-CM | POA: Diagnosis not present

## 2021-11-20 DIAGNOSIS — L8990 Pressure ulcer of unspecified site, unspecified stage: Secondary | ICD-10-CM | POA: Insufficient documentation

## 2021-11-20 DIAGNOSIS — E669 Obesity, unspecified: Secondary | ICD-10-CM | POA: Diagnosis present

## 2021-11-20 DIAGNOSIS — R4182 Altered mental status, unspecified: Secondary | ICD-10-CM | POA: Diagnosis not present

## 2021-11-20 DIAGNOSIS — Z853 Personal history of malignant neoplasm of breast: Secondary | ICD-10-CM

## 2021-11-20 DIAGNOSIS — R159 Full incontinence of feces: Secondary | ICD-10-CM | POA: Diagnosis present

## 2021-11-20 DIAGNOSIS — R6520 Severe sepsis without septic shock: Secondary | ICD-10-CM

## 2021-11-20 DIAGNOSIS — Z7982 Long term (current) use of aspirin: Secondary | ICD-10-CM

## 2021-11-20 DIAGNOSIS — R7989 Other specified abnormal findings of blood chemistry: Secondary | ICD-10-CM

## 2021-11-20 HISTORY — DX: Unspecified atrial fibrillation: I48.91

## 2021-11-20 LAB — RESP PANEL BY RT-PCR (FLU A&B, COVID) ARPGX2
Influenza A by PCR: NEGATIVE
Influenza B by PCR: NEGATIVE
SARS Coronavirus 2 by RT PCR: NEGATIVE

## 2021-11-20 LAB — CBC WITH DIFFERENTIAL/PLATELET
Abs Immature Granulocytes: 0.12 10*3/uL — ABNORMAL HIGH (ref 0.00–0.07)
Basophils Absolute: 0.1 10*3/uL (ref 0.0–0.1)
Basophils Relative: 1 %
Eosinophils Absolute: 0.2 10*3/uL (ref 0.0–0.5)
Eosinophils Relative: 1 %
HCT: 34.9 % — ABNORMAL LOW (ref 36.0–46.0)
Hemoglobin: 11.4 g/dL — ABNORMAL LOW (ref 12.0–15.0)
Immature Granulocytes: 1 %
Lymphocytes Relative: 7 %
Lymphs Abs: 1.1 10*3/uL (ref 0.7–4.0)
MCH: 26.4 pg (ref 26.0–34.0)
MCHC: 32.7 g/dL (ref 30.0–36.0)
MCV: 80.8 fL (ref 80.0–100.0)
Monocytes Absolute: 1.5 10*3/uL — ABNORMAL HIGH (ref 0.1–1.0)
Monocytes Relative: 9 %
Neutro Abs: 13.4 10*3/uL — ABNORMAL HIGH (ref 1.7–7.7)
Neutrophils Relative %: 81 %
Platelets: 175 10*3/uL (ref 150–400)
RBC: 4.32 MIL/uL (ref 3.87–5.11)
RDW: 15.8 % — ABNORMAL HIGH (ref 11.5–15.5)
WBC: 16.5 10*3/uL — ABNORMAL HIGH (ref 4.0–10.5)
nRBC: 0.1 % (ref 0.0–0.2)

## 2021-11-20 LAB — TSH: TSH: 2.014 u[IU]/mL (ref 0.350–4.500)

## 2021-11-20 LAB — I-STAT CHEM 8, ED
BUN: 62 mg/dL — ABNORMAL HIGH (ref 8–23)
Calcium, Ion: 1.19 mmol/L (ref 1.15–1.40)
Chloride: 100 mmol/L (ref 98–111)
Creatinine, Ser: 1.7 mg/dL — ABNORMAL HIGH (ref 0.44–1.00)
Glucose, Bld: 98 mg/dL (ref 70–99)
HCT: 37 % (ref 36.0–46.0)
Hemoglobin: 12.6 g/dL (ref 12.0–15.0)
Potassium: 4 mmol/L (ref 3.5–5.1)
Sodium: 133 mmol/L — ABNORMAL LOW (ref 135–145)
TCO2: 26 mmol/L (ref 22–32)

## 2021-11-20 LAB — I-STAT VENOUS BLOOD GAS, ED
Acid-Base Excess: 1 mmol/L (ref 0.0–2.0)
Bicarbonate: 25.7 mmol/L (ref 20.0–28.0)
Calcium, Ion: 1.2 mmol/L (ref 1.15–1.40)
HCT: 37 % (ref 36.0–46.0)
Hemoglobin: 12.6 g/dL (ref 12.0–15.0)
O2 Saturation: 79 %
Potassium: 4 mmol/L (ref 3.5–5.1)
Sodium: 133 mmol/L — ABNORMAL LOW (ref 135–145)
TCO2: 27 mmol/L (ref 22–32)
pCO2, Ven: 39.5 mmHg — ABNORMAL LOW (ref 44.0–60.0)
pH, Ven: 7.421 (ref 7.250–7.430)
pO2, Ven: 42 mmHg (ref 32.0–45.0)

## 2021-11-20 LAB — TROPONIN I (HIGH SENSITIVITY)
Troponin I (High Sensitivity): 35 ng/L — ABNORMAL HIGH (ref <18)
Troponin I (High Sensitivity): 11 ng/L (ref <18)

## 2021-11-20 LAB — D-DIMER, QUANTITATIVE: D-Dimer, Quant: 1.79 ug/mL-FEU — ABNORMAL HIGH (ref 0.00–0.50)

## 2021-11-20 LAB — URINALYSIS, ROUTINE W REFLEX MICROSCOPIC
Bilirubin Urine: NEGATIVE
Glucose, UA: NEGATIVE mg/dL
Ketones, ur: NEGATIVE mg/dL
Nitrite: NEGATIVE
Protein, ur: 30 mg/dL — AB
Specific Gravity, Urine: 1.014 (ref 1.005–1.030)
WBC, UA: >50 WBC/hpf — ABNORMAL HIGH (ref 0–5)
pH: 5 (ref 5.0–8.0)

## 2021-11-20 LAB — COMPREHENSIVE METABOLIC PANEL
ALT: 15 U/L (ref 0–44)
AST: 24 U/L (ref 15–41)
Albumin: 2.4 g/dL — ABNORMAL LOW (ref 3.5–5.0)
Alkaline Phosphatase: 127 U/L — ABNORMAL HIGH (ref 38–126)
Anion gap: 11 (ref 5–15)
BUN: 52 mg/dL — ABNORMAL HIGH (ref 8–23)
CO2: 22 mmol/L (ref 22–32)
Calcium: 9.2 mg/dL (ref 8.9–10.3)
Chloride: 98 mmol/L (ref 98–111)
Creatinine, Ser: 1.67 mg/dL — ABNORMAL HIGH (ref 0.44–1.00)
GFR, Estimated: 31 mL/min — ABNORMAL LOW (ref >60)
Glucose, Bld: 93 mg/dL (ref 70–99)
Potassium: 4.8 mmol/L (ref 3.5–5.1)
Sodium: 131 mmol/L — ABNORMAL LOW (ref 135–145)
Total Bilirubin: 2.5 mg/dL — ABNORMAL HIGH (ref 0.3–1.2)
Total Protein: 6.1 g/dL — ABNORMAL LOW (ref 6.5–8.1)

## 2021-11-20 LAB — RAPID URINE DRUG SCREEN, HOSP PERFORMED
Amphetamines: NOT DETECTED
Barbiturates: NOT DETECTED
Benzodiazepines: NOT DETECTED
Cocaine: NOT DETECTED
Opiates: NOT DETECTED
Tetrahydrocannabinol: NOT DETECTED

## 2021-11-20 LAB — ETHANOL: Alcohol, Ethyl (B): <10 mg/dL (ref <10)

## 2021-11-20 LAB — AMMONIA: Ammonia: 52 umol/L — ABNORMAL HIGH (ref 9–35)

## 2021-11-20 LAB — PROTIME-INR
INR: 1.1 (ref 0.8–1.2)
Prothrombin Time: 13.7 seconds (ref 11.4–15.2)

## 2021-11-20 LAB — T4, FREE: Free T4: 1.59 ng/dL — ABNORMAL HIGH (ref 0.61–1.12)

## 2021-11-20 LAB — CBG MONITORING, ED: Glucose-Capillary: 98 mg/dL (ref 70–99)

## 2021-11-20 LAB — MAGNESIUM: Magnesium: 1.8 mg/dL (ref 1.7–2.4)

## 2021-11-20 LAB — LACTIC ACID, PLASMA: Lactic Acid, Venous: 1.2 mmol/L (ref 0.5–1.9)

## 2021-11-20 LAB — BRAIN NATRIURETIC PEPTIDE: B Natriuretic Peptide: 184.3 pg/mL — ABNORMAL HIGH (ref 0.0–100.0)

## 2021-11-20 MED ORDER — SODIUM CHLORIDE 0.9 % IV SOLN
1.0000 g | INTRAVENOUS | Status: DC
  Administered 2021-11-20: 1 g via INTRAVENOUS
  Filled 2021-11-20: qty 10

## 2021-11-20 MED ORDER — LACTATED RINGERS IV SOLN: INTRAVENOUS | Status: DC

## 2021-11-20 MED ORDER — SODIUM CHLORIDE 0.9 % IV SOLN
1.0000 g | Freq: Once | INTRAVENOUS | Status: AC
  Administered 2021-11-20: 1 g via INTRAVENOUS
  Filled 2021-11-20: qty 10

## 2021-11-20 MED ORDER — ACETAMINOPHEN 325 MG PO TABS
650.0000 mg | ORAL_TABLET | ORAL | Status: DC | PRN
  Administered 2021-11-23: 650 mg via ORAL
  Filled 2021-11-20: qty 2

## 2021-11-20 MED ORDER — ONDANSETRON HCL 4 MG/2ML IJ SOLN: 4.0000 mg | Freq: Four times a day (QID) | INTRAMUSCULAR | Status: DC | PRN

## 2021-11-20 MED ORDER — DILTIAZEM HCL-DEXTROSE 125-5 MG/125ML-% IV SOLN (PREMIX)
5.0000 mg/h | INTRAVENOUS | Status: DC
Start: 2021-11-20 — End: 2021-11-20

## 2021-11-20 MED ORDER — IOHEXOL 350 MG/ML SOLN
50.0000 mL | Freq: Once | INTRAVENOUS | Status: AC | PRN
  Administered 2021-11-20: 50 mL via INTRAVENOUS

## 2021-11-20 MED ORDER — HEPARIN (PORCINE) 25000 UT/250ML-% IV SOLN
2300.0000 [IU]/h | INTRAVENOUS | Status: DC
  Administered 2021-11-20: 1200 [IU]/h via INTRAVENOUS
  Administered 2021-11-21: 10:00:00 1600 [IU]/h via INTRAVENOUS
  Administered 2021-11-21: 1900 [IU]/h via INTRAVENOUS
  Administered 2021-11-22: 2100 [IU]/h via INTRAVENOUS
  Administered 2021-11-22: 2300 [IU]/h via INTRAVENOUS
  Filled 2021-11-20 (×6): qty 250

## 2021-11-20 MED ORDER — HEPARIN (PORCINE) 25000 UT/250ML-% IV SOLN: 1550.0000 [IU]/h | INTRAVENOUS | Status: DC

## 2021-11-20 MED ORDER — SODIUM CHLORIDE 0.9 % IV SOLN
2.0000 g | INTRAVENOUS | Status: DC
  Administered 2021-11-21 – 2021-11-22 (×2): 2 g via INTRAVENOUS
  Filled 2021-11-20 (×3): qty 20

## 2021-11-20 MED ORDER — DILTIAZEM LOAD VIA INFUSION
10.0000 mg | Freq: Once | INTRAVENOUS | Status: DC
Start: 2021-11-20 — End: 2021-11-20

## 2021-11-20 MED ORDER — METOPROLOL TARTRATE 5 MG/5ML IV SOLN
5.0000 mg | Freq: Once | INTRAVENOUS | Status: AC
  Administered 2021-11-20: 5 mg via INTRAVENOUS
  Filled 2021-11-20: qty 5

## 2021-11-20 MED ORDER — LACTATED RINGERS IV BOLUS
1000.0000 mL | Freq: Once | INTRAVENOUS | Status: AC
  Administered 2021-11-20: 1000 mL via INTRAVENOUS

## 2021-11-20 MED ORDER — HEPARIN BOLUS VIA INFUSION
5000.0000 [IU] | Freq: Once | INTRAVENOUS | Status: DC
  Filled 2021-11-20: qty 5000

## 2021-11-20 MED ORDER — SODIUM CHLORIDE 0.9 % IV SOLN: INTRAVENOUS | Status: AC

## 2021-11-20 MED ORDER — HEPARIN BOLUS VIA INFUSION
4000.0000 [IU] | Freq: Once | INTRAVENOUS | Status: AC
  Administered 2021-11-20: 4000 [IU] via INTRAVENOUS
  Filled 2021-11-20: qty 4000

## 2021-11-20 NOTE — Assessment & Plan Note (Addendum)
-  Currently wasn BP meds in face of acute kidney injury and hypotension on admission -Continue to monitor blood pressures per protocol -She was given IV metoprolol tartrate 5 mg once the day before yesterday; cardiology starting the patient on Cardizem 30 mg p.o. every 6 scheduled and transitioning to 120 mg p.o. daily today -Last blood pressure reading was improved and is now 146/81 -Resume home Antihypertensives at D/C

## 2021-11-20 NOTE — ED Provider Notes (Addendum)
[facility] EMERGENCY DEPARTMENT Provider Note   CSN: [id number] Arrival date & time: 11/20/21  1511     History  No chief complaint on file.   Meagan Wallace is a 76 y.o. female.  HPI  76 year old female presenting to the emergency department with a medical history significant for DM 2, HTN, anemia, depression, anxiety, breast cancer who presents to the emergency department with altered mental status.  The patient reportedly lives by herself.  History was additionally provided by EMS.  The patient has been having diarrhea for the past 2 days.  Family was concerned for altered mental status in the setting of urinary tract infection.  The patient denies any chest pain or shortness of breath.  She denies any history of atrial fibrillation.  She is not on anticoagulation.  On arrival, the patient was GCS 14, ABC intact, AAO x2, in atrial fibrillation with RVR with initial normotensive pressures, subsequently became hypotensive.  In the setting of a diarrheal illness, concern for hypovolemia with dry mucous membranes on physical exam.  The patient was administered an IV fluid bolus and started on IV amiodarone.  Unclear how long the patient has been in atrial fibrillation for.  Additional hx per daughter in law, Trinity Haun: she has a new diagnosis of cirrhosis. She is normally alert, aware, AAOx3 at baseline. Confusion for the past 48 hours. Coming from home. Hx of heart murmur but no hx of afib. Profuse watery diarrhea. Walking with assistance of a walker up until two days ago. Was found on the floor two days ago. Incontinent of urine and stool at baseline. Blood in her urine. Profuse watery diarrhea and has been bedbound since that time. Recently came home for the past three weeks after skilled nursing rehab.   Home Medications Prior to Admission medications   Medication Sig Start Date End Date Taking? Authorizing Provider  albuterol (PROVENTIL) (2.5 MG/3ML) 0.083%  nebulizer solution Take 2.5 mg by nebulization every 6 (six) hours as needed for wheezing or shortness of breath.    [provider]  amLODipine (NORVASC) 10 MG tablet Take 10 mg by mouth daily. 12/01/13   [provider]  Ascorbic Acid (VITAMIN C PO) Take 1 tablet by mouth daily.    [provider]  aspirin 325 MG tablet Take 325 mg by mouth daily. 12/01/13   [provider]  Cholecalciferol (VITAMIN D3 PO) Take 1 tablet by mouth daily.    [provider]  Cyanocobalamin (VITAMIN B 12 PO) Take 1 tablet by mouth daily.    [provider]  furosemide (LASIX) 20 MG tablet Take 20 mg by mouth daily. 09/05/18   [provider]  ibuprofen (ADVIL) 200 MG tablet Take 400 mg by mouth every 6 (six) hours as needed for headache or moderate pain.    [provider]  metFORMIN (GLUCOPHAGE) 500 MG tablet Take 2 tablets (1,000 mg total) by mouth 2 (two) times daily with a meal. 07/01/19   Nicholas Lose, MD  Multiple Vitamins-Minerals (MULTIVITAMIN WITH MINERALS) tablet Take 1 tablet by mouth daily.    [provider]  oxyCODONE-acetaminophen (PERCOCET/ROXICET) 5-325 MG tablet Take 1 tablet by mouth every 6 (six) hours as needed for severe pain. 08/04/21   Alroy Bailiff, Margaux, PA-C  pravastatin (PRAVACHOL) 10 MG tablet Take 10 mg by mouth at bedtime.    [provider]  ramipril (ALTACE) 10 MG capsule Take 10 mg by mouth daily.    [provider]  Semaglutide,0.25 or 0.5MG /DOS, (OZEMPIC, 0.25 OR 0.5 MG/DOSE,) 2 MG/1.5ML SOPN Inject 0.5 mg into the skin every Thursday.    [provider]  tamoxifen (NOLVADEX) 20 MG tablet TAKE 1 TABLET BY MOUTH EVERY DAY Patient not taking: No sig reported 07/05/19   Nicholas Lose, MD  venlafaxine XR (EFFEXOR-XR) 150 MG 24 hr capsule Take 150 mg by mouth daily with breakfast. 01/04/16   [provider]      Allergies    Patient has no known allergies.    Review of  Systems   Review of Systems  Unable to perform ROS: Mental status change   Physical Exam Updated Vital Signs BP 102/71    Pulse (!) 102    Temp 98.3 F (36.8 C) (Oral)    Resp 19    Ht 5\' 5"  (1.651 m)    Wt 113.6 kg    SpO2 95%    BMI 41.68 kg/m  Physical Exam Vitals and nursing note reviewed.  Constitutional:      General: She is not in acute distress.    Appearance: She is obese.  HENT:     Head: Normocephalic and atraumatic.     Mouth/Throat:     Mouth: Mucous membranes are dry.  Eyes:     Conjunctiva/sclera: Conjunctivae normal.     Pupils: Pupils are equal, round, and reactive to light.  Cardiovascular:     Rate and Rhythm: Tachycardia present. Rhythm irregular.  Pulmonary:     Effort: Pulmonary effort is normal. No respiratory distress.  Abdominal:     General: There is no distension.     Tenderness: There is no guarding.  Musculoskeletal:        General: No deformity or signs of injury.     Cervical back: Neck supple.  Skin:    Findings: No lesion or rash.  Neurological:     General: No focal deficit present.     Mental Status: She is alert. Mental status is at baseline.     Comments: MENTAL STATUS EXAM:    Orientation: Alert and oriented to person, place and time.  Memory: Cooperative, follows commands well.  Language: Speech is clear and language is normal.   CRANIAL NERVES:    CN 2 (Optic): Visual fields intact to confrontation.  CN 3,4,6 (EOM): Pupils equal and reactive to light. Full extraocular eye movement without nystagmus.  CN 5 (Trigeminal): Facial sensation is normal, no weakness of masticatory muscles.  CN 7 (Facial): No facial weakness or asymmetry.  CN 8 (Auditory): Auditory acuity grossly normal.  CN 9,10 (Glossophar): The uvula is midline, the palate elevates symmetrically.  CN 11 (spinal access): Normal sternocleidomastoid and trapezius strength.  CN 12 (Hypoglossal): The tongue is midline. No atrophy or fasciculations.Marland Kitchen   MOTOR:  Muscle  Strength: 5/5RUE, 5/5LUE, 5/5RLE, 5/5LLE  REFLEXES: No clonus.   COORDINATION:   Intact finger-to-nose, no tremor.   SENSATION:   Intact to light touch all four extremities.  GAIT: Not assessed     ED Results / Procedures / Treatments   Labs (all labs ordered are listed, but only abnormal results are displayed) Labs Reviewed  COMPREHENSIVE METABOLIC PANEL - Abnormal; Notable for the following components:      Result Value   Sodium 131 (*)    BUN 52 (*)    Creatinine, Ser 1.67 (*)    Total Protein 6.1 (*)    Albumin 2.4 (*)    Alkaline Phosphatase 127 (*)    Total Bilirubin 2.5 (*)  GFR, Estimated 31 (*)    All other components within normal limits  CBC WITH DIFFERENTIAL/PLATELET - Abnormal; Notable for the following components:   WBC 16.5 (*)    Hemoglobin 11.4 (*)    HCT 34.9 (*)    RDW 15.8 (*)    Neutro Abs 13.4 (*)    Monocytes Absolute 1.5 (*)    Abs Immature Granulocytes 0.12 (*)    All other components within normal limits  URINALYSIS, ROUTINE W REFLEX MICROSCOPIC - Abnormal; Notable for the following components:   APPearance CLOUDY (*)    Hgb urine dipstick LARGE (*)    Protein, ur 30 (*)    Leukocytes,Ua LARGE (*)    WBC, UA >50 (*)    Bacteria, UA MANY (*)    Non Squamous Epithelial 6-10 (*)    All other components within normal limits  BRAIN NATRIURETIC PEPTIDE - Abnormal; Notable for the following components:   B Natriuretic Peptide 184.3 (*)    All other components within normal limits  D-DIMER, QUANTITATIVE - Abnormal; Notable for the following components:   D-Dimer, Quant 1.79 (*)    All other components within normal limits  AMMONIA - Abnormal; Notable for the following components:   Ammonia 52 (*)    All other components within normal limits  T4, FREE - Abnormal; Notable for the following components:   Free T4 1.59 (*)    All other components within normal limits  I-STAT CHEM 8, ED - Abnormal; Notable for the following components:   Sodium  133 (*)    BUN 62 (*)    Creatinine, Ser 1.70 (*)    All other components within normal limits  I-STAT VENOUS BLOOD GAS, ED - Abnormal; Notable for the following components:   pCO2, Ven 39.5 (*)    Sodium 133 (*)    All other components within normal limits  TROPONIN I (HIGH SENSITIVITY) - Abnormal; Notable for the following components:   Troponin I (High Sensitivity) 35 (*)    All other components within normal limits  RESP PANEL BY RT-PCR (FLU A&B, COVID) ARPGX2  CULTURE, BLOOD (ROUTINE X 2)  CULTURE, BLOOD (ROUTINE X 2)  URINE CULTURE  C DIFFICILE QUICK SCREEN W PCR REFLEX    GASTROINTESTINAL PANEL BY PCR, STOOL (REPLACES STOOL CULTURE)  RAPID URINE DRUG SCREEN, HOSP PERFORMED  ETHANOL  TSH  PROTIME-INR  HEPARIN LEVEL (UNFRACTIONATED)  CBC  MAGNESIUM  LACTIC ACID, PLASMA  LACTIC ACID, PLASMA  CBG MONITORING, ED  CBG MONITORING, ED  TROPONIN I (HIGH SENSITIVITY)    EKG EKG Interpretation  Date/Time:  Tuesday November 20 2021 15:29:18 EST Ventricular Rate:  116 PR Interval:    QRS Duration: 76 QT Interval:  322 QTC Calculation: 448 R Axis:   -28 Text Interpretation: Atrial fibrillation with RVR Abnormal R-wave progression, early transition Inferior infarct, old Consider anterior infarct Confirmed by Regan Lemming ([id number]) on 11/20/2021 4:11:45 PM  Radiology DG Chest 2 View  Result Date: 11/20/2021 CLINICAL DATA:  Altered mental status. EXAM: CHEST - 2 VIEW COMPARISON:  May 06, 2017. FINDINGS: Stable cardiomegaly. Both lungs are clear. The visualized skeletal structures are unremarkable. IMPRESSION: No active cardiopulmonary disease. Electronically Signed   By: Marijo Conception M.D.   On: 11/20/2021 16:47   CT HEAD WO CONTRAST  Result Date: 11/20/2021 CLINICAL DATA:  Mental status change. Unknown cause. Uncovertebral diarrhea for the past 2 days. EXAM: CT HEAD WITHOUT CONTRAST TECHNIQUE: Contiguous axial images were obtained from the base of  the skull through the vertex  without intravenous contrast. RADIATION DOSE REDUCTION: This exam was performed according to the departmental dose-optimization program which includes automated exposure control, adjustment of the mA and/or kV according to patient size and/or use of iterative reconstruction technique. COMPARISON:  CT brain 08/04/2021 FINDINGS: Brain: The ventricles are normal in size and configuration. The basilar cisterns are patent. No mass, mass effect, or midline shift. No acute intracranial hemorrhage is seen. No abnormal extra-axial fluid collection. Mild periventricular white matter hypodensities are similar to prior, likely chronic ischemic white matter changes. There are atherosclerotic intracranial calcifications. Preservation of the normal cortical gray-white interface without CT evidence of an acute major vascular territorial cortical based infarction. Vascular: No hyperdense vessel or unexpected calcification. Skull: Normal. Negative for fracture or focal lesion. Sinuses/Orbits: Status post bilateral orbital lens replacements. The visualized paranasal sinuses and mastoid air cells are clear. Other: None. IMPRESSION:: IMPRESSION: 1. Mild chronic ischemic white matter changes, unchanged from prior. 2. No acute intracranial process. Electronically Signed   By: Yvonne Kendall M.D.   On: 11/20/2021 17:38    Procedures .Critical Care Performed by: Regan Lemming, MD Authorized by: Regan Lemming, MD   Critical care provider statement:    Critical care time (minutes):  45   Critical care was necessary to treat or prevent imminent or life-threatening deterioration of the following conditions:  Shock   Critical care was time spent personally by me on the following activities:  Discussions with consultants, evaluation of patient's response to treatment, examination of patient, obtaining history from patient or surrogate, ordering and performing treatments and interventions, ordering and review of laboratory studies,  ordering and review of radiographic studies, pulse oximetry, re-evaluation of patient's condition and review of old charts   Care discussed with: admitting provider      Medications Ordered in ED Medications  heparin ADULT infusion 100 units/mL (25000 units/220mL) (1,200 Units/hr Intravenous New Bag/Given 11/20/21 1947)  metoprolol tartrate (LOPRESSOR) injection 5 mg (has no administration in time range)  lactated ringers infusion (has no administration in time range)  cefTRIAXone (ROCEPHIN) 1 g in sodium chloride 0.9 % 100 mL IVPB (has no administration in time range)  lactated ringers bolus 1,000 mL (0 mLs Intravenous Stopped 11/20/21 1931)  cefTRIAXone (ROCEPHIN) 1 g in sodium chloride 0.9 % 100 mL IVPB (0 g Intravenous Stopped 11/20/21 1714)  heparin bolus via infusion 4,000 Units (4,000 Units Intravenous Bolus from Bag 11/20/21 1948)    ED Course/ Medical Decision Making/ A&P Clinical Course as of 11/20/21 2125  Tue Nov 20, 2021  2008 Creatinine(!): 1.67 [JL]  2008 BUN(!): 52 [JL]    Clinical Course User Index [JL] Regan Lemming, MD                           Medical Decision Making Amount and/or Complexity of Data Reviewed Labs: ordered. Decision-making details documented in ED Course. Radiology: ordered.  Risk Prescription drug management. Decision regarding hospitalization.   76 year old female presenting to the emergency department with a medical history significant for DM 2, HTN, anemia, depression, anxiety, breast cancer who presents to the emergency department with altered mental status.  The patient reportedly lives by herself.  History was additionally provided by EMS.  The patient has been having diarrhea for the past 2 days.  Family was concerned for altered mental status in the setting of urinary tract infection.  The patient denies any chest pain or shortness of breath.  She  denies any history of atrial fibrillation.  She is not on anticoagulation.   Additional hx per  daughter in law, Cheray Pardi: she has a new diagnosis of cirrhosis. She is normally alert, aware, AAOx3 at baseline. Confusion for the past 48 hours. Coming from home. Hx of heart murmur but no hx of afib. Profuse watery diarrhea. Walking with assistance of a walker up until two days ago. Was found on the floor two days ago. Incontinent of urine and stool at baseline. Blood in her urine. Profuse watery diarrhea and has been bedbound since that time. Recently came home for the past three weeks after skilled nursing rehab.    On arrival, the patient was GCS 14, ABC intact, AAO x2, in atrial fibrillation with RVR with initial normotensive pressures, subsequently became hypotensive.  In the setting of a diarrheal illness, concern for hypovolemia with dry mucous membranes on physical exam.  Unclear how long the patient has been in atrial fibrillation for.  Atrial fibrillation with RVR was noted on cardiac telemetry with rates in the 110s.  This was confirmed with an EKG which revealed atrial fibrillation, rates in the 110s, no clear ST segment changes to indicate coronary ischemia.  The patient denies any active chest pain or shortness of breath.  She appears to be mildly encephalopathic from her baseline as reported by her daughter.  Symptoms have been ongoing for the past 2 days.  On exam, she had no focal neurologic deficits.  Differential diagnosis includes hypovolemia from diarrheal illness, urinary tract infection, toxic metabolic derangement to include electrolyte abnormality, hyperammonemia, AKI with elevated BUN, sepsis from a respiratory or urinary source.  The patient has no abdominal tenderness on exam.  She has no focal neurologic deficits indicate CVA.  Given the patient's soft blood pressures in the setting of hypertension, IV fluids were administered for volume resuscitation and the patient was started on IV amiodarone.  The patient was administered IV Rocephin.  Patient reportedly has a history  of cirrhosis.  She has no abdominal tenderness to palpation and is afebrile.  Low concern for SBP.  Her work-up reviewed.  The patient was found to have a new AKI with a creatinine of 1.7 and elevated BUN to 62 which is likely prerenal in the setting of dehydration in the setting of volume losses from her diarrhea.  A VBG did not reveal an acidosis or alkalosis CBG was normal, urinalysis was consistent with urinary tract infection with large leukocytes, greater than 50 WBCs and many bacteria.  Initial cardiac troponin was unremarkable and a BNP was nonspecifically mildly elevated to 184.  UDS was normal.  Blood cultures x2 were collected in addition to urine culture.  COVID-19 and influenza PCR testing was collected and resulted negative.  The patient was found to have a CBC with a leukocytosis to 16.5.  There has been some delay in laboratory testing today.  Code sepsis called based on the patient's tachycardia and leukocytosis.  The patient had previously been administered IV Rocephin but blood cultures were collected prior to Rocephin administration.  A lactic acid was collected and pending.  The patient was started on IV heparin due to new onset atrial fibrillation with RVR.  Following volume resuscitation, the patient was notably no longer in RVR.  She remained hemodynamically stable.  Suspect likely hypovolemia in the setting of volume losses due to the patient's profuse watery diarrhea.  C. difficile and GI pathogen panel ordered.  Considered sepsis with developing septic shock although the  patient rapidly responded to minimal volume resuscitation.  5 mg of IV Lopressor was ordered.    A D-dimer was collected and resulted elevated to 1.79.  CTA PE study was ordered and pending.  CT head revealed no acute intracranial abnormality and a chest x-ray revealed no focal consolidation suspected pneumonia, no other focal cardiac or pulmonary abnormality, reviewed by myself and radiology and agree with the  radiology interpretation.  Following volume resuscitation, patient did remain out of RVR and therefore was not started on further rate control medications.  Dr. Bridgett Larsson of hospitalist medicine was consulted for admission in the setting of urinary tract infection, sepsis, new onset A-fib.   Final Clinical Impression(s) / ED Diagnoses Final diagnoses:  AKI (acute kidney injury) ([facility])  Sepsis with acute renal failure without septic shock, due to unspecified organism, unspecified acute renal failure type ([place])  Hypovolemia  Diarrhea, unspecified type  Atrial fibrillation with RVR [facility])    Rx / DC Orders ED Discharge Orders     None         Regan Lemming, MD 11/20/21 2125    Regan Lemming, MD 11/20/21 2126

## 2021-11-20 NOTE — Sepsis Progress Note (Signed)
Sepsis protocol is being followed by [facility]. 

## 2021-11-20 NOTE — Assessment & Plan Note (Addendum)
-   Patient had a urinalysis done which shows a cloudy appearance with large hemoglobin, large leukocytes, negative nitrites, many bacteria, 6-10 no squamous epithelial cells, 11-20 RBCs per high-power field, 6-10 squamous epithelial cells, greater than 50 WBCs with urine and blood culture still pending -Urine culture grew out 40,000 colony-forming units of E. coli with sensitivities pending and blood cultures x2 showed no growth to date -Continue with IV ceftriaxone for now and change to po Keflex at D/C

## 2021-11-20 NOTE — Assessment & Plan Note (Addendum)
-  Received a lactated Ringer's bolus 1 time in the ED and was not started on any maintenance IV fluids so we will start her on 75 mL and this was now finished and stopped -AKI likely caused by combination of volume loss from diarrhea and continued ACE inhibitor use. -Patient's BUNs/creatinine went from 62/1.70 -> 44/1.34 -> 20/1.04 and today it was a little elevate at 16/1.18 -Avoid further nephrotoxic medications, contrast dyes, hypotension and renally dose medications -Repeat CMP in the a.m.

## 2021-11-20 NOTE — Subjective & Objective (Signed)
Chief complaint: Diarrhea History present illness: 76 year old white female with a history of type 2 diabetes without complication, hypertension, history of cirrhosis, obesity, presents to the ER today with a 2-day history of diarrhea.  Patient lives by herself.  Apparently family was concerned the patient may have UTI.  No family is at the bedside at this point.  On arrival to the ER, temp 98, heart rate 118 blood pressure 155/116.  EKG showed rapid A-fib.  Blood pressure slowly dropped to 94/56.  Code sepsis was called.  Patient given IV fluids.  UA showed a UTI.  Started IV antibiotics.  Chemistry showed acute kidney injury with an elevated serum creatinine.  TSH was normal but free T4 is slightly elevated at 1.59.  Patient is not known to be on thyroid replacement.  Due to patient's AKI, rapid A-fib and acute diarrhea, [facility] hospitalist contacted for admission.

## 2021-11-20 NOTE — Assessment & Plan Note (Addendum)
-  Admitted to progressive bed and she was started on IV lopressor ordered in ER and given 5 mg x 1.  -HR 100-110s. If sustained HR >100, would consider IV cardizem gtts at 5 mg/hr without bolus.  -Checked TSH and was 2.014 and her free T4 was elevated at 1.59 -Mag level was 1.7 so will replete with IV mag sulfate 2 g again -Echocardiogram has been ordered and showed left ventricular ejection fraction 55 to 60% with no regional wall motion abnormalities and mild LVH with the left ventricular diastolic primary being indeterminate and right ventricular systolic function being normal -Cardiology started the patient on Cardizem 30 mg p.o. every 6 scheduled and if she tolerates this we will transition her to 120 mg p.o. daily tomorrow -Was anticoagulated with a heparin drip and transition to p.o. apixaban -CHA2DS2-VASc score was at least 7 -Continue with heparin drip for now and change to p.o. anticoagulation and will be transition to p.o. apixaban but cardiology may need to transition her to something else -Cardiology can consider cardioversion to see if she would hold sinus rhythm in the outpatient setting after she is therapeutically anticoagulated for greater than 21 days but if she does not hold a sinus rhythm then rate control and anticoagulation may be the best option as she is asymptomatic -Appreciate cardiology evaluation and management -Continue to monitor on telemetry while hospitalized and patient is to be discharged and follow-up with cardiology within 4 weeks and they are recommending discussing at that time whether to proceed with cardioversion or to continue with rate control at that time and Dr. Stanford Breed favors 1 attempted cardioversion if she does not hold sinus rhythm continue rate control afterwards as she is asymptomatic

## 2021-11-20 NOTE — Progress Notes (Signed)
°  Amiodarone Drug - Drug Interaction Consult Note  Recommendations: Caution use of blood pressure lowering agents (i.e., amlodipine, furosemide, ramipril) with amiodarone as this increases the risk of hypotension. Monitor patients closely for additive hypotensive effects. No recommendations to change current orders or home medications at this time.    Amiodarone is metabolized by the cytochrome P450 system and therefore has the potential to cause many drug interactions. Amiodarone has an average plasma half-life of 50 days (range 20 to 100 days).   There is potential for drug interactions to occur several weeks or months after stopping treatment and the onset of drug interactions may be slow after initiating amiodarone.   []  Statins: Increased risk of myopathy. Simvastatin- restrict dose to 20mg  daily. Other statins: counsel patients to report any muscle pain or weakness immediately.  []  Anticoagulants: Amiodarone can increase anticoagulant effect. Consider warfarin dose reduction. Patients should be monitored closely and the dose of anticoagulant altered accordingly, remembering that amiodarone levels take several weeks to stabilize.  []  Antiepileptics: Amiodarone can increase plasma concentration of phenytoin, the dose should be reduced. Note that small changes in phenytoin dose can result in large changes in levels. Monitor patient and counsel on signs of toxicity.  []  Beta blockers: increased risk of bradycardia, AV block and myocardial depression. Sotalol - avoid concomitant use.  []   Calcium channel blockers (diltiazem and verapamil): increased risk of bradycardia, AV block and myocardial depression.  []   Cyclosporine: Amiodarone increases levels of cyclosporine. Reduced dose of cyclosporine is recommended.  []  Digoxin dose should be halved when amiodarone is started.  [x]  Diuretics: increased risk of cardiotoxicity if hypokalemia occurs.  []  Oral hypoglycemic agents (glyburide,  glipizide, glimepiride): increased risk of hypoglycemia. Patient's glucose levels should be monitored closely when initiating amiodarone therapy.   []  Drugs that prolong the QT interval:  Torsades de pointes risk may be increased with concurrent use - avoid if possible.  Monitor QTc, also keep magnesium/potassium WNL if concurrent therapy can't be avoided.  Antibiotics: e.g. fluoroquinolones, erythromycin.  Antiarrhythmics: e.g. quinidine, procainamide, disopyramide, sotalol.  Antipsychotics: e.g. phenothiazines, haloperidol.   Lithium, tricyclic antidepressants, and methadone.  Thank You,  Joseph Art, Pharm.D. PGY-1 Pharmacy Resident [phone] 11/20/2021 10:52 PM

## 2021-11-20 NOTE — Assessment & Plan Note (Addendum)
-  Complicates overall prognosis and care -Estimated body mass index is 41.68 kg/m as calculated from the following:   Height as of this encounter: 5\' 5"  (1.651 m).   Weight as of this encounter: 113.6 kg.  -Weight Loss and Dietary Counseling given

## 2021-11-20 NOTE — ED Triage Notes (Signed)
Pt BIB [facility] from home d/t AMS. Pt lives by herself. Per EMS, Pt has been having uncontrollable diarrhea for the past 2 days.  Family concerned about UTI. No CP or SOB.   BP 176/92 CBG 122 AFIB w EMS.

## 2021-11-20 NOTE — Assessment & Plan Note (Addendum)
-  Chronic. -LFTs and Bilirubin are normal at this time -Continue monitor for decompensation and repeat CMP in a.m. -Check PT and INR in the morning; patient's PT/INR is 13.1/1.0

## 2021-11-20 NOTE — H&P (Signed)
History and Physical    Meagan Wallace:[id number] DOB: [birth year] DOA: 11/20/2021  DOS: the patient was seen and examined on 11/20/2021  PCP: Charlynn Court, NP   Patient coming from: Home  I have personally briefly reviewed patient's old medical records in [place]  Chief complaint: Diarrhea History present illness: 76 year old white female with a history of type 2 diabetes without complication, hypertension, history of cirrhosis, obesity, presents to the ER today with a 2-day history of diarrhea.  Patient lives by herself.  Apparently family was concerned the patient may have UTI.  No family is at the bedside at this point.  On arrival to the ER, temp 98, heart rate 118 blood pressure 155/116.  EKG showed rapid A-fib.  Blood pressure slowly dropped to 94/56.  Code sepsis was called.  Patient given IV fluids.  UA showed a UTI.  Started IV antibiotics.  Chemistry showed acute kidney injury with an elevated serum creatinine.  TSH was normal but free T4 is slightly elevated at 1.59.  Patient is not known to be on thyroid replacement.  Due to patient's AKI, rapid A-fib and acute diarrhea, [facility] hospitalist contacted for admission.    ED Course: in rapid afib. Hypotensive. Received  IVF. UA showed UTI. Labs showed AKI. Started on IVF rocephin.   Review of Systems:  Review of Systems  Constitutional:  Positive for malaise/fatigue. Negative for fever and weight loss.  HENT: Negative.    Eyes: Negative.   Respiratory: Negative.  Negative for cough, hemoptysis and sputum production.   Cardiovascular: Negative.  Negative for chest pain, palpitations and orthopnea.  Gastrointestinal:  Positive for diarrhea.  Genitourinary: Negative.   Musculoskeletal: Negative.   Skin: Negative.   Neurological:  Positive for weakness.  Endo/Heme/Allergies: Negative.   Psychiatric/Behavioral: Negative.    All other systems reviewed and are negative.  Past Medical History:   Diagnosis Date   Allergy    Anemia    Anxiety    Arthritis    hands, knees   Asthma    Breast cancer ([place])    right breast   Cataract    bilateral- surgery to remove   Depression    Diabetes mellitus without complication (HCC)    Heart murmur    never has caused any problems per patient   Hyperlipidemia    Hypertension    Personal history of radiation therapy    PONV (postoperative nausea and vomiting)    either   Radiation 02/02/14-03/17/14   Right Breast   Stroke [facility])    at age 33 yrs old, no problems since per patient    Past Surgical History:  Procedure Laterality Date   APPENDECTOMY  1987   BIOPSY  06/12/2021   Procedure: BIOPSY;  Surgeon: Otis Brace, MD;  Location: [facility];  Service: Gastroenterology;;   BREAST BIOPSY Right    BREAST LUMPECTOMY     right breast 2015   BREAST LUMPECTOMY WITH RADIOACTIVE SEED LOCALIZATION Right 12/27/2013   Procedure: BREAST LUMPECTOMY WITH RADIOACTIVE SEED LOCALIZATION;  Surgeon: Marcello Moores A. Cornett, MD;  Location: [facility];  Service: General;  Laterality: Right;   COLONOSCOPY  10/27/2017   polyps/[place]   COLONOSCOPY WITH PROPOFOL N/A 06/12/2021   Procedure: COLONOSCOPY WITH PROPOFOL;  Surgeon: Otis Brace, MD;  Location: [facility];  Service: Gastroenterology;  Laterality: N/A;   ESOPHAGOGASTRODUODENOSCOPY (EGD) WITH PROPOFOL N/A 06/12/2021   Procedure: ESOPHAGOGASTRODUODENOSCOPY (EGD) WITH PROPOFOL;  Surgeon: Otis Brace, MD;  Location: [facility];  Service: Gastroenterology;  Laterality: N/A;   EXPLORATORY LAPAROTOMY  1987   EYE SURGERY Bilateral    cataracts removed   POLYPECTOMY  06/12/2021   Procedure: POLYPECTOMY;  Surgeon: Otis Brace, MD;  Location: [facility];  Service: Gastroenterology;;   SHOULDER SURGERY  2011   right     reports that she has never smoked. She has never used smokeless tobacco. She reports that she does not drink alcohol and does not use drugs.  No  Known Allergies  Family History  Problem Relation Age of Onset   Heart disease Mother    Stroke Father    Breast cancer Paternal Aunt    Colon cancer Neg Hx    Esophageal cancer Neg Hx    Rectal cancer Neg Hx    Stomach cancer Neg Hx     Prior to Admission medications   Medication Sig Start Date End Date Taking? Authorizing Provider  albuterol (PROVENTIL) (2.5 MG/3ML) 0.083% nebulizer solution Take 2.5 mg by nebulization every 6 (six) hours as needed for wheezing or shortness of breath.    [provider]  amLODipine (NORVASC) 10 MG tablet Take 10 mg by mouth daily. 12/01/13   [provider]  Ascorbic Acid (VITAMIN C PO) Take 1 tablet by mouth daily.    [provider]  aspirin 325 MG tablet Take 325 mg by mouth daily. 12/01/13   [provider]  Cholecalciferol (VITAMIN D3 PO) Take 1 tablet by mouth daily.    [provider]  Cyanocobalamin (VITAMIN B 12 PO) Take 1 tablet by mouth daily.    [provider]  furosemide (LASIX) 20 MG tablet Take 20 mg by mouth daily. 09/05/18   [provider]  ibuprofen (ADVIL) 200 MG tablet Take 400 mg by mouth every 6 (six) hours as needed for headache or moderate pain.    [provider]  metFORMIN (GLUCOPHAGE) 500 MG tablet Take 2 tablets (1,000 mg total) by mouth 2 (two) times daily with a meal. 07/01/19   Nicholas Lose, MD  Multiple Vitamins-Minerals (MULTIVITAMIN WITH MINERALS) tablet Take 1 tablet by mouth daily.    [provider]  oxyCODONE-acetaminophen (PERCOCET/ROXICET) 5-325 MG tablet Take 1 tablet by mouth every 6 (six) hours as needed for severe pain. 08/04/21   Alroy Bailiff, [place], PA-C  pravastatin (PRAVACHOL) 10 MG tablet Take 10 mg by mouth at bedtime.    [provider]  ramipril (ALTACE) 10 MG capsule Take 10 mg by mouth daily.    [provider]  Semaglutide,0.25 or 0.5MG /DOS, (OZEMPIC, 0.25 OR 0.5 MG/DOSE,) 2 MG/1.5ML SOPN Inject 0.5 mg  into the skin every Thursday.    [provider]  tamoxifen (NOLVADEX) 20 MG tablet TAKE 1 TABLET BY MOUTH EVERY DAY Patient not taking: No sig reported 07/05/19   Nicholas Lose, MD  venlafaxine XR (EFFEXOR-XR) 150 MG 24 hr capsule Take 150 mg by mouth daily with breakfast. 01/04/16   [provider]    Physical Exam: Vitals:   11/20/21 1835 11/20/21 1906 11/20/21 1933 11/20/21 1945  BP: (!) 100/59  101/75 102/71  Pulse: 100  (!) 106 (!) 102  Resp: (!) 25  20 19   Temp:      TempSrc:      SpO2: 95%  96% 95%  Weight:  113.6 kg    Height:  5\' 5"  (1.651 m)      Physical Exam Vitals and nursing note reviewed.  Constitutional:      General: She is not  in acute distress.    Appearance: She is obese. She is ill-appearing. She is not toxic-appearing or diaphoretic.     Comments: Chronically ill-appearing female  HENT:     Head: Normocephalic and atraumatic.     Nose: Nose normal. No rhinorrhea.  Eyes:     General: No scleral icterus. Cardiovascular:     Rate and Rhythm: Tachycardia present. Rhythm irregular.     Heart sounds: Murmur heard.  Pulmonary:     Effort: Pulmonary effort is normal. No respiratory distress.     Breath sounds: Normal breath sounds. No wheezing or rales.  Abdominal:     General: Abdomen is protuberant. Bowel sounds are normal. There is no distension.     Tenderness: There is no abdominal tenderness. There is no guarding or rebound.  Musculoskeletal:     Right lower leg: Edema present.     Left lower leg: Edema present.  Skin:    General: Skin is warm and dry.     Capillary Refill: Capillary refill takes less than 2 seconds.  Neurological:     General: No focal deficit present.     Mental Status: She is alert and oriented to person, place, and time.     Labs on Admission: I have personally reviewed following labs and imaging studies  CBC: Recent Labs  Lab 11/20/21 1536 11/20/21 1610 11/20/21 1611  WBC 16.5*  --   --   NEUTROABS  13.4*  --   --   HGB 11.4* 12.6 12.6  HCT 34.9* 37.0 37.0  MCV 80.8  --   --   PLT 175  --   --    Basic Metabolic Panel: Recent Labs  Lab 11/20/21 1536 11/20/21 1610 11/20/21 1611  NA 131* 133* 133*  K 4.8 4.0 4.0  CL 98  --  100  CO2 22  --   --   GLUCOSE 93  --  98  BUN 52*  --  62*  CREATININE 1.67*  --  1.70*  CALCIUM 9.2  --   --    GFR: Estimated Creatinine Clearance: 34.8 mL/min (A) (by C-G formula based on SCr of 1.7 mg/dL (H)). Liver Function Tests: Recent Labs  Lab 11/20/21 1536  AST 24  ALT 15  ALKPHOS 127*  BILITOT 2.5*  PROT 6.1*  ALBUMIN 2.4*   No results for input(s): LIPASE, AMYLASE in the last 168 hours. Recent Labs  Lab 11/20/21 1640  AMMONIA 52*   Coagulation Profile: Recent Labs  Lab 11/20/21 1944  INR 1.1   Cardiac Enzymes: No results for input(s): CKTOTAL, CKMB, CKMBINDEX, TROPONINI in the last 168 hours. BNP (last 3 results) No results for input(s): PROBNP in the last 8760 hours. HbA1C: No results for input(s): HGBA1C in the last 72 hours. CBG: Recent Labs  Lab 11/20/21 1615  GLUCAP 98   Lipid Profile: No results for input(s): CHOL, HDL, LDLCALC, TRIG, CHOLHDL, LDLDIRECT in the last 72 hours. Thyroid Function Tests: Recent Labs    11/20/21 1536 11/20/21 1642  TSH  --  2.014  FREET4 1.59*  --    Anemia Panel: No results for input(s): VITAMINB12, FOLATE, FERRITIN, TIBC, IRON, RETICCTPCT in the last 72 hours. Urine analysis:    Component Value Date/Time   COLORURINE YELLOW 11/20/2021 1609   APPEARANCEUR CLOUDY (A) 11/20/2021 1609   LABSPEC 1.014 11/20/2021 1609   PHURINE 5.0 11/20/2021 1609   GLUCOSEU NEGATIVE 11/20/2021 1609   HGBUR LARGE (A) 11/20/2021 1609   BILIRUBINUR NEGATIVE 11/20/2021  [place] 11/20/2021 1609   PROTEINUR 30 (A) 11/20/2021 1609   NITRITE NEGATIVE 11/20/2021 1609   LEUKOCYTESUR LARGE (A) 11/20/2021 1609    Radiological Exams on Admission: I have personally reviewed  images DG Chest 2 View  Result Date: 11/20/2021 CLINICAL DATA:  Altered mental status. EXAM: CHEST - 2 VIEW COMPARISON:  May 06, 2017. FINDINGS: Stable cardiomegaly. Both lungs are clear. The visualized skeletal structures are unremarkable. IMPRESSION: No active cardiopulmonary disease. Electronically Signed   By: Marijo Conception M.D.   On: 11/20/2021 16:47   CT HEAD WO CONTRAST  Result Date: 11/20/2021 CLINICAL DATA:  Mental status change. Unknown cause. Uncovertebral diarrhea for the past 2 days. EXAM: CT HEAD WITHOUT CONTRAST TECHNIQUE: Contiguous axial images were obtained from the base of the skull through the vertex without intravenous contrast. RADIATION DOSE REDUCTION: This exam was performed according to the departmental dose-optimization program which includes automated exposure control, adjustment of the mA and/or kV according to patient size and/or use of iterative reconstruction technique. COMPARISON:  CT brain 08/04/2021 FINDINGS: Brain: The ventricles are normal in size and configuration. The basilar cisterns are patent. No mass, mass effect, or midline shift. No acute intracranial hemorrhage is seen. No abnormal extra-axial fluid collection. Mild periventricular white matter hypodensities are similar to prior, likely chronic ischemic white matter changes. There are atherosclerotic intracranial calcifications. Preservation of the normal cortical gray-white interface without CT evidence of an acute major vascular territorial cortical based infarction. Vascular: No hyperdense vessel or unexpected calcification. Skull: Normal. Negative for fracture or focal lesion. Sinuses/Orbits: Status post bilateral orbital lens replacements. The visualized paranasal sinuses and mastoid air cells are clear. Other: None. IMPRESSION:: IMPRESSION: 1. Mild chronic ischemic white matter changes, unchanged from prior. 2. No acute intracranial process. Electronically Signed   By: Yvonne Kendall M.D.   On: 11/20/2021  17:38    EKG: I have personally reviewed EKG: rapid afib   Assessment/Plan Principal Problem:   Atrial fibrillation with rapid ventricular response (HCC) Active Problems:   Acute cystitis   AKI (acute kidney injury) ([place])   Acute diarrhea   Type 2 diabetes mellitus without complications (HCC)   Obesity   HTN (hypertension)   Cirrhosis of liver (HCC)    Assessment and Plan: * Atrial fibrillation with rapid ventricular response ([place])- (present on admission) Admit to progressive bed. IV lopressor ordered in ER. HR 100-110s. If sustained HR >100, would started IV cardizem gtts at 5 mg/hr without bolus. Check echo. Check magnesium.  AKI (acute kidney injury) ([place]) Continue IV fluids.  Likely caused by combination of volume loss from diarrhea and continued ACE inhibitor use.  Acute cystitis Continue IV Rocephin.  Urine culture was ordered in the ER.  Acute diarrhea Checking GI viral  Panel.  Enteric precautions.  Cirrhosis of liver ([place])- (present on admission) Chronic.  HTN (hypertension)- (present on admission) Hold BP meds in face of acute kidney injury and hypotension.  Obesity- (present on admission) Chronic  Type 2 diabetes mellitus without complications ([place]) Placed on sliding scale. Hold oral meds.  Hold Ozempic.   DVT prophylaxis: IV heparin gtts Code Status: Full Code Family Communication: no family at bedside  Disposition Plan: return home  Consults called: EDP has consulted cardiology for new onset afib  Admission status: Inpatient,  progressive   Kristopher Oppenheim, DO [facility] 11/20/2021, 8:37 PM

## 2021-11-20 NOTE — ED Notes (Signed)
Rn noted soiled lines. Linens changed and new brief applied

## 2021-11-20 NOTE — Assessment & Plan Note (Addendum)
-  Checking GI viral  Panel but will discontinue given that she has had no further bowel movement.  Enteric precautions also discontinued. -WBC improving and went from 16.5 -> 12.7 -> 7.7 yesterday but she is likely hemoconcentrated on admission from dehydration and is now 8.9 -GI pathogen panel canceled as above and will need to monitor for her diarrhea

## 2021-11-20 NOTE — Assessment & Plan Note (Addendum)
-  Hold oral medications as well as holding Ozempic but can resume at discharge -Initiated on sensitive NovoLog sign scale insulin before meals and at bedtime and adjust insulin as necessary -Checked hemoglobin A1c and was 5.5  -Continue monitor CBGs per protocol; CBGs ranging from 118-154 -Follow up with PCP for further management and evaluation

## 2021-11-20 NOTE — Progress Notes (Signed)
ANTICOAGULATION CONSULT NOTE - Initial Consult  Pharmacy Consult for heparin Indication: atrial fibrillation  No Known Allergies  Patient Measurements:   Heparin Dosing Weight: 84 kg   Vital Signs: Temp: 98.3 F (36.8 C) (02/07 1525) Temp Source: Oral (02/07 1525) BP: 100/59 (02/07 1835) Pulse Rate: 100 (02/07 1835)  Labs: Recent Labs    11/20/21 1536 11/20/21 1610 11/20/21 1611  HGB 11.4* 12.6 12.6  HCT 34.9* 37.0 37.0  PLT 175  --   --   CREATININE 1.67*  --  1.70*  TROPONINIHS 35*  --   --     CrCl cannot be calculated (Unknown ideal weight.).   Medical History: Past Medical History:  Diagnosis Date   Allergy    Anemia    Anxiety    Arthritis    hands, knees   Asthma    Breast cancer ([place])    right breast   Cataract    bilateral- surgery to remove   Depression    Diabetes mellitus without complication (HCC)    Heart murmur    never has caused any problems per patient   Hyperlipidemia    Hypertension    Personal history of radiation therapy    PONV (postoperative nausea and vomiting)    either   Radiation 02/02/14-03/17/14   Right Breast   Stroke [facility])    at age 54 yrs old, no problems since per patient    Assessment: 76YO female admitted for altered mental status. Upon arrival, patient was in atrial fibrillation with RVR. Past medical history significant for T2DM, HTN, and CVA. Not on anticoagulation prior to admission.   Goal of Therapy:  Heparin level 0.3-0.7 units/ml Monitor platelets by anticoagulation protocol: Yes   Plan:  Heparin bolus 4000 units x1 Start heparin infusion at 1200 units/hr Heparin level in 8 hours Monitor CBC, HL, and s/sx of bleeding daily   Joseph Art, Pharm.D. PGY-1 Pharmacy Resident [phone] 11/20/2021 7:22 PM

## 2021-11-20 NOTE — ED Notes (Signed)
Patient transported to CT 

## 2021-11-21 ENCOUNTER — Inpatient Hospital Stay (HOSPITAL_COMMUNITY): Payer: Medicare Other

## 2021-11-21 ENCOUNTER — Other Ambulatory Visit: Payer: Self-pay

## 2021-11-21 DIAGNOSIS — I10 Essential (primary) hypertension: Secondary | ICD-10-CM

## 2021-11-21 DIAGNOSIS — E7220 Disorder of urea cycle metabolism, unspecified: Secondary | ICD-10-CM

## 2021-11-21 DIAGNOSIS — D649 Anemia, unspecified: Secondary | ICD-10-CM

## 2021-11-21 DIAGNOSIS — D0511 Intraductal carcinoma in situ of right breast: Secondary | ICD-10-CM

## 2021-11-21 DIAGNOSIS — E871 Hypo-osmolality and hyponatremia: Secondary | ICD-10-CM

## 2021-11-21 DIAGNOSIS — I4891 Unspecified atrial fibrillation: Secondary | ICD-10-CM

## 2021-11-21 DIAGNOSIS — D509 Iron deficiency anemia, unspecified: Secondary | ICD-10-CM | POA: Insufficient documentation

## 2021-11-21 DIAGNOSIS — L8990 Pressure ulcer of unspecified site, unspecified stage: Secondary | ICD-10-CM | POA: Insufficient documentation

## 2021-11-21 DIAGNOSIS — G9340 Encephalopathy, unspecified: Secondary | ICD-10-CM

## 2021-11-21 DIAGNOSIS — D72829 Elevated white blood cell count, unspecified: Secondary | ICD-10-CM

## 2021-11-21 LAB — CBC WITH DIFFERENTIAL/PLATELET
Abs Immature Granulocytes: 0.11 10*3/uL — ABNORMAL HIGH (ref 0.00–0.07)
Basophils Absolute: 0.1 10*3/uL (ref 0.0–0.1)
Basophils Relative: 1 %
Eosinophils Absolute: 0.2 10*3/uL (ref 0.0–0.5)
Eosinophils Relative: 1 %
HCT: 33.1 % — ABNORMAL LOW (ref 36.0–46.0)
Hemoglobin: 10.5 g/dL — ABNORMAL LOW (ref 12.0–15.0)
Immature Granulocytes: 1 %
Lymphocytes Relative: 7 %
Lymphs Abs: 0.9 10*3/uL (ref 0.7–4.0)
MCH: 26.1 pg (ref 26.0–34.0)
MCHC: 31.7 g/dL (ref 30.0–36.0)
MCV: 82.1 fL (ref 80.0–100.0)
Monocytes Absolute: 1.3 10*3/uL — ABNORMAL HIGH (ref 0.1–1.0)
Monocytes Relative: 10 %
Neutro Abs: 10.2 10*3/uL — ABNORMAL HIGH (ref 1.7–7.7)
Neutrophils Relative %: 80 %
Platelets: 176 10*3/uL (ref 150–400)
RBC: 4.03 MIL/uL (ref 3.87–5.11)
RDW: 15.7 % — ABNORMAL HIGH (ref 11.5–15.5)
WBC: 12.7 10*3/uL — ABNORMAL HIGH (ref 4.0–10.5)
nRBC: 0 % (ref 0.0–0.2)

## 2021-11-21 LAB — COMPREHENSIVE METABOLIC PANEL
ALT: 12 U/L (ref 0–44)
AST: 11 U/L — ABNORMAL LOW (ref 15–41)
Albumin: 2.1 g/dL — ABNORMAL LOW (ref 3.5–5.0)
Alkaline Phosphatase: 103 U/L (ref 38–126)
Anion gap: 12 (ref 5–15)
BUN: 44 mg/dL — ABNORMAL HIGH (ref 8–23)
CO2: 23 mmol/L (ref 22–32)
Calcium: 8.9 mg/dL (ref 8.9–10.3)
Chloride: 99 mmol/L (ref 98–111)
Creatinine, Ser: 1.37 mg/dL — ABNORMAL HIGH (ref 0.44–1.00)
GFR, Estimated: 40 mL/min — ABNORMAL LOW (ref >60)
Glucose, Bld: 163 mg/dL — ABNORMAL HIGH (ref 70–99)
Potassium: 3.6 mmol/L (ref 3.5–5.1)
Sodium: 134 mmol/L — ABNORMAL LOW (ref 135–145)
Total Bilirubin: 0.4 mg/dL (ref 0.3–1.2)
Total Protein: 5.5 g/dL — ABNORMAL LOW (ref 6.5–8.1)

## 2021-11-21 LAB — ECHOCARDIOGRAM COMPLETE
AR max vel: 2.23 cm2
AV Peak grad: 16 mmHg
Ao pk vel: 2 m/s
Area-P 1/2: 4.57 cm2
Height: 65 in
S' Lateral: 3.2 cm
Weight: 4007.08 oz

## 2021-11-21 LAB — GLUCOSE, CAPILLARY: Glucose-Capillary: 102 mg/dL — ABNORMAL HIGH (ref 70–99)

## 2021-11-21 LAB — HEPARIN LEVEL (UNFRACTIONATED)
Heparin Unfractionated: <0.1 IU/mL — ABNORMAL LOW (ref 0.30–0.70)
Heparin Unfractionated: <0.1 IU/mL — ABNORMAL LOW (ref 0.30–0.70)
Heparin Unfractionated: 0.17 IU/mL — ABNORMAL LOW (ref 0.30–0.70)

## 2021-11-21 LAB — MAGNESIUM: Magnesium: 1.7 mg/dL (ref 1.7–2.4)

## 2021-11-21 MED ORDER — LACTATED RINGERS IV SOLN: INTRAVENOUS | Status: DC

## 2021-11-21 MED ORDER — INSULIN ASPART 100 UNIT/ML IJ SOLN: 0.0000 [IU] | Freq: Every day | INTRAMUSCULAR | Status: DC

## 2021-11-21 MED ORDER — HEPARIN BOLUS VIA INFUSION
3000.0000 [IU] | Freq: Once | INTRAVENOUS | Status: AC
  Administered 2021-11-21: 3000 [IU] via INTRAVENOUS
  Filled 2021-11-21: qty 3000

## 2021-11-21 MED ORDER — INSULIN ASPART 100 UNIT/ML IJ SOLN
0.0000 [IU] | Freq: Three times a day (TID) | INTRAMUSCULAR | Status: DC
  Administered 2021-11-22: 2 [IU] via SUBCUTANEOUS
  Administered 2021-11-22 – 2021-11-23 (×2): 1 [IU] via SUBCUTANEOUS

## 2021-11-21 MED ORDER — MAGNESIUM SULFATE 2 GM/50ML IV SOLN
2.0000 g | Freq: Once | INTRAVENOUS | Status: AC
  Administered 2021-11-21: 2 g via INTRAVENOUS
  Filled 2021-11-21: qty 50

## 2021-11-21 NOTE — Assessment & Plan Note (Addendum)
-  Ammonia level was 52 on admission and improved to 43 -We will hold off giving lactulose given her significant amount of diarrhea however diarrhea has improved now -Follow up with Gastroenterology within 1-2 weeks

## 2021-11-21 NOTE — Progress Notes (Signed)
Progress Note   Patient: Meagan Wallace MLY:[id number] DOB: 1946-03-25 DOA: 11/20/2021     1 DOS: the patient was seen and examined on 11/21/2021   Brief hospital course: Patient is a 76 year old morbidly obese Caucasian female with a past medical history significant for but not limited to diabetes mellitus type 2, hypertension, hyperlipidemia, history of cirrhosis, history of breast cancer, history of depression and anxiety, history of CVA as well as other comorbidities resented the ED with a 2-day history of diarrhea.  Patient lives by herself and the patient's family was concerned that patient may have had a UTI.  No family is at bedside initially but she came to the ED for her diarrhea and was found to be in new onset A-fib with RVR.  Blood pressures on the lower side and a code sepsis was called.  She was given IV fluid resuscitation and I suspected that she had a UTI so started on antibiotics.  She was found to have an AKI but now her AKI is improving with fluid hydration.  Due to her AKI, rapid atrial fibrillation with acute diarrhea tried hospitalist team was consulted for admission.  She was given fluid boluses and started on IV Rocephin which we will continue.  She was admitted to the progressive bed in the ED and per report the EDP consulted cardiology for new onset atrial fibrillation but I do not see a consult placed so we will call them now.  Assessment and Plan: * Atrial fibrillation with rapid ventricular response ([place])- (present on admission) Admit to progressive bed and she was started on IV lopressor ordered in ER and given 5 mg x 1.  -HR 100-110s. If sustained HR >100, would consider IV cardizem gtts at 5 mg/hr without bolus.  -Checked TSH and was 2.014 -Mag level was 1.7 so will replete with IV mag sulfate 2 g -Echocardiogram has been ordered and showed left ventricular ejection fraction 55 to 60% with no regional wall motion abnormalities and mild LVH with the left ventricular  diastolic primary being indeterminate and right ventricular systolic function being normal -Currently anticoagulated with a heparin drip -CHA2DS2-VASc score was at least a 5 -Continue with heparin drip for now and change to p.o. anticoagulation -We will consult cardiology for further evaluation and establishment for follow-up -Continue to monitor on telemetry     Acute encephalopathy -Unclear if this is related to hepatic encephalopathy versus infection in the setting of her diarrhea and possible cystitis -CT of the head done which showed mild chronic ischemic white matter changes which is unchanged from prior no acute intracranial processes -Ammonia level was elevated at 52 but will repeat in the morning -Continue to treat and evaluate for her diarrhea -If not improving will consider further work-up -Patient appeared to be appropriate this morning but was very lethargic. -Continue to monitor and trend closely  Hyponatremia -Patient's Na+ went from 133 -> 134 -Continue with IV fluid hydration with lactated Ringer's at 75 MLS per hour -Repeat CMP in the a.m.   Normocytic anemia - Patient's hemoglobin/hematocrit went from 12.6/37.0 and is now 10.5/33.1 -Check anemia panel in the a.m. -Continue to monitor for signs and symptoms of bleeding; currently no overt bleeding noted -Repeat CBC in a.m.  Leukocytosis -WBC went from 16.5 -> 12.7 -Likely reactive and she is likely hemoconcentrated on admission in the setting of dehydration from her diarrhea -Continue monitor for signs and symptoms and she is already on empiric antibiotics with IV ceftriaxone for suspected cystitis -Repeat  CBC in the a.m.  Hyperammonemia (HCC) -Ammonia level was 52 on admission -We will hold off giving lactulose given her significant amount of diarrhea  Pressure injury of skin -poA -Consult WOC nurse for further evaluation and recommendations  Acute diarrhea -Checking GI viral  Panel.  Enteric  precautions. -WBC improving and went from 16.5 and is now 12.7 but she is likely hemoconcentrated on admission from dehydration -GI pathogen panel still has not been sent  AKI (acute kidney injury) ([place]) -Received a lactated Ringer's bolus 1 time in the ED and was not started on any maintenance IV fluids so we will start her on 75 mL -AKI likely caused by combination of volume loss from diarrhea and continued ACE inhibitor use. -Patient's BUNs/creatinine went from 62/1.70 is now 44/1.34 -Avoid further nephrotoxic medications, contrast dyes, hypotension and renally dose medications -Repeat CMP in the a.m.  Acute cystitis - Patient had a urinalysis done which shows a cloudy appearance with large hemoglobin, large leukocytes, negative nitrites, many bacteria, 6-10 no squamous epithelial cells, 11-20 RBCs per high-power field, 6-10 squamous epithelial cells, greater than 50 WBCs with urine and blood culture still pending -Continue with IV ceftriaxone for now and follow blood and urine cultures  Cirrhosis of liver ([place])- (present on admission) -Chronic. -LFTs and bilirubin are normal at this time -Continue monitor for decompensation and repeat CMP in a.m. -Check PT and INR in the morning  HTN (hypertension)- (present on admission) -Currently Holdin BP meds in face of acute kidney injury and hypotension on admission -Continue to monitor blood pressures per protocol -She was given IV metoprolol tartrate 5 mg once yesterday evening -Last blood pressure reading was sided 104/71  Obesity, Class III, BMI 40-49.9 (morbid obesity) ([place])- (present on admission) -Complicates overall prognosis and care -Estimated body mass index is 41.68 kg/m as calculated from the following:   Height as of this encounter: 5\' 5"  (1.651 m).   Weight as of this encounter: 113.6 kg.  -Weight Loss and Dietary Counseling given   Type 2 diabetes mellitus without complications (HCC) -Hold oral medications as well as  holding Ozempic -Initiate on sensitive NovoLog sign scale insulin before meals and at bedtime and adjust insulin as necessary -Check hemoglobin A1c in the morning -Continue monitor CBGs per protocol; CBGs not done yet but blood sugar on CMP this morning was 163  Ductal carcinoma in situ (DCIS) of right breast- (present on admission) -CTA PE done and showed Circumscribed fat and calcification in the right breast likely representing fat necrosis. Possibly an old biopsy cavity or old Hematoma." -No longer taking tamoxifen   Active Pressure Injury/Wound(s)     Pressure Ulcer  Duration          Pressure Injury Buttocks Left Stage 2 -  Partial thickness loss of dermis presenting as a shallow open injury with a red, pink wound bed without slough. -- days   Pressure Injury 11/21/21 Coccyx Mid Stage 1 -  Intact skin with non-blanchable redness of a localized area usually over a bony prominence. redness <1 day           Subjective: Seen and examined at bedside and she was a little lethargic but she states that she is doing okay.  States that she was having abdominal pain earlier that was crampy on the right side that radiated to her umbilicus but this is improved.  Had significant watery diarrhea states that she had 2 episodes yesterday and 2 episodes this morning.  Denies any nausea or  vomiting.  Heart rate was uncontrolled when she came in was doing a little bit better now.  Blood pressure was on the lower side.  She denies any other concerns or complaints at this time.  Physical Exam: Vitals:   11/21/21 1220 11/21/21 1225 11/21/21 1230 11/21/21 1240  BP: 102/72 118/76 113/70 104/71  Pulse: 93 100 99 97  Resp: (!) 24 (!) 27 (!) 25 18  Temp:      TempSrc:      SpO2: 96% 95% 96% 98%  Weight:      Height:       Examination: Physical Exam:  Constitutional: WN/WD morbidly obese Caucasian female currently in mild distress appears a little uncomfortable Eyes: Lids and conjunctivae normal,  sclerae anicteric  ENMT: External Ears, Nose appear normal. Grossly normal hearing.  Neck: Appears normal, supple, no cervical masses, normal ROM, no appreciable thyromegaly: No appreciable JVD Respiratory: Diminished to auscultation bilaterally with coarse breath sounds, no wheezing, rales, rhonchi or crackles. Normal respiratory effort and patient is not tachypenic. No accessory muscle use.  Cardiovascular: Irregularly irregular and slightly tachycardic, no murmurs / rubs / gallops. S1 and S2 auscultated.  Mild lower extremity edema Abdomen: Soft, non-tender, distended secondary body habitus and has have some palpable splenomegaly. No masses palpated.   GU: Deferred. Musculoskeletal: No clubbing / cyanosis of digits/nails. No joint deformity upper and lower extremities.  Skin: No rashes, lesions, ulcers on limited skin evaluation. No induration; Warm and dry.  Neurologic: CN 2-12 grossly intact with no focal deficits. Romberg sign cerebellar reflexes not assessed.  Psychiatric: Impaired judgment and insight. Alert and oriented x 2. Normal mood and appropriate affect.   Data Reviewed: Independently reviewed and interpreted the patient's CMP, CBC, urinalysis, UDS  Her AKI is improving and she has a slight hyponatremia.  She was hyperammonemic and now she has a anemia and a improving leukocytosis  Family Communication: Discussed with the patient's sister Mechele Claude  Disposition: Status is: Inpatient Remains inpatient appropriate because: Needs further cardiac evaluation and improvement in her mental status and diarrhea as well as labs   Planned Discharge Destination: Skilled nursing facility versus home health  DVT Prophylaxis: SCDs and Anticoagulated with Heparin gtt  Author: Raiford Noble, DO [facility]  11/21/2021 6:18 PM  For on call review [email or web address].

## 2021-11-21 NOTE — Progress Notes (Signed)
°   11/21/21 2138  Assess: MEWS Score  Temp 97.9 F (36.6 C)  BP 113/70  Pulse Rate (!) 107  ECG Heart Rate (!) 102  Resp (!) 22  SpO2 94 %  O2 Device Room Air  Assess: MEWS Score  MEWS Temp 0  MEWS Systolic 0  MEWS Pulse 1  MEWS RR 1  MEWS LOC 0  MEWS Score 2  MEWS Score Color Yellow  Assess: if the MEWS score is Yellow or Red  Were vital signs taken at a resting state? Yes  Focused Assessment No change from prior assessment  Early Detection of Sepsis Score *See Row Information* Medium  MEWS guidelines implemented *See Row Information* Yes  Treat  MEWS Interventions Escalated (See documentation below)  Take Vital Signs  Increase Vital Sign Frequency  Yellow: Q 2hr X 2 then Q 4hr X 2, if remains yellow, continue Q 4hrs  Escalate  MEWS: Escalate Yellow: discuss with charge nurse/RN and consider discussing with provider and RRT  Notify: Charge Nurse/RN  Name of Charge Nurse/RN Notified Debra RN  Date Charge Nurse/RN Notified 11/21/21  Time Charge Nurse/RN Notified 2145  Document  Patient Outcome Not stable and remains on department  Progress note created (see row info) Yes

## 2021-11-21 NOTE — Plan of Care (Signed)
  Problem: Clinical Measurements: Goal: Respiratory complications will improve Outcome: Progressing   

## 2021-11-21 NOTE — Progress Notes (Signed)
Echocardiogram 2D Echocardiogram has been performed.  Meagan Wallace 11/21/2021, 1:57 PM

## 2021-11-21 NOTE — Assessment & Plan Note (Addendum)
-  CTA PE done and showed Circumscribed fat and calcification in the right breast likely representing fat necrosis. Possibly an old biopsy cavity or old Hematoma." -No longer taking Tamoxifen

## 2021-11-21 NOTE — Progress Notes (Signed)
ANTICOAGULATION CONSULT NOTE - Follow Up Consult  Pharmacy Consult for heparin Indication: atrial fibrillation  Labs: Recent Labs    11/20/21 1536 11/20/21 1610 11/20/21 1611 11/20/21 1830 11/20/21 1944 11/21/21 0307  HGB 11.4* 12.6 12.6  --   --  10.5*  HCT 34.9* 37.0 37.0  --   --  33.1*  PLT 175  --   --   --   --  176  LABPROT  --   --   --   --  13.7  --   INR  --   --   --   --  1.1  --   HEPARINUNFRC  --   --   --   --   --  <0.10*  CREATININE 1.67*  --  1.70*  --   --  1.37*  TROPONINIHS 35*  --   --  11  --   --     Assessment: 75yo female subtherapeutic on heparin with initial dosing for Afib; no infusion issues or signs of bleeding per RN.  Goal of Therapy:  Heparin level 0.3-0.7 units/ml   Plan:  Will rebolus with heparin 3000 units and increase heparin infusion by 4 units/kg/hr to 1600 units/hr and check level in 8 hours.    Wynona Neat, PharmD, BCPS  11/21/2021,5:23 AM

## 2021-11-21 NOTE — Hospital Course (Addendum)
Patient is a 76 year old morbidly obese Caucasian female with a past medical history significant for but not limited to diabetes mellitus type 2, hypertension, hyperlipidemia, history of cirrhosis, history of breast cancer, history of depression and anxiety, history of CVA as well as other comorbidities resented the ED with a 2-day history of diarrhea.  Patient lives by herself and the patient's family was concerned that patient may have had a UTI.  No family is at bedside initially but she came to the ED for her diarrhea and was found to be in new onset A-fib with RVR.  Blood pressures on the lower side and a code sepsis was called.  She was given IV fluid resuscitation and I suspected that she had a UTI so started on antibiotics.  She was found to have an AKI but now her AKI is improving with fluid hydration.  Due to her AKI, rapid atrial fibrillation with acute diarrhea tried hospitalist team was consulted for admission.  She was given fluid boluses and started on IV Rocephin which we will continue.  She was admitted to the progressive bed in the ED and cardiology was consulted and patient was placed on p.o. Cardizem.  She was initiated on heparin drip and then transition to p.o. apixaban.  We will get PT and OT to evaluate and treat and per cardiology we will transition to p.o. on 120 mg of Cardizem in the morning.  She may need to be transition to something else given that the apixaban may be too prohibitively expensive for the patient but will have TOC assist the [facility] team recommends that the patient's family go through the company for assistance.  She is medically stable to be discharged to SNF at this time cardiology is cleared.  Prior to discharge her potassium was repleted along with her magnesium.

## 2021-11-21 NOTE — Assessment & Plan Note (Addendum)
-  WBC went from 16.5 -> 12.7 and is now resolved and trended down to 7.7 yesterday and today was 8.9 -Likely reactive and she was likely hemoconcentrated on admission in the setting of dehydration from her diarrhea -Continue monitor for signs and symptoms and she is already on empiric antibiotics with IV ceftriaxone for suspected cystitis; urine culture growing out 40,000 colony units of E. Coli which was pan-sensitive and will change to p.o. Keflex at discharge -Repeat CBC in the a.m.

## 2021-11-21 NOTE — Assessment & Plan Note (Addendum)
-  [place] nurse for further evaluation and recommendations and follow at [facility]

## 2021-11-21 NOTE — ED Notes (Signed)
Breakfast Orders Placed °

## 2021-11-21 NOTE — Assessment & Plan Note (Addendum)
-  Patient's Na+ went from 133 -> 134 -> 136 -> 140 -Continued with IV fluid hydration with lactated Ringer's at 75 MLS but now discontinued  -Repeat CMP within 1 week

## 2021-11-21 NOTE — Assessment & Plan Note (Addendum)
-   Patient's hemoglobin/hematocrit went from 12.6/37.0 -> 10.5/33.1 -> 10.0/30.7 -> 10.2/32.2 -Checked anemia panel showed an iron level of 26, U IBC 195, TIBC of 221, saturation ratios of 12%, ferritin of 98, folate level 11.2, vitamin B12 at 336 -Discuss with PCP about iron supplementation as an outpatient  -Continue to monitor for signs and symptoms of bleeding; currently no overt bleeding noted -Repeat CBC within 1 week

## 2021-11-21 NOTE — Progress Notes (Signed)
°  Transition of Care [facility]) Screening Note   Patient Details  Name: CALIANN LECKRONE Date of Birth: 29-Jul-1946   Transition of Care [facility]) CM/SW Contact:    Milas Gain, [place] Phone Number: 11/21/2021, 4:03 PM    Transition of Care Department [facility]) has reviewed patient and no TOC needs have been identified at this time. We will continue to monitor patient advancement through interdisciplinary progression rounds. If new patient transition needs arise, please place a TOC consult.

## 2021-11-21 NOTE — Assessment & Plan Note (Addendum)
-  Unclear if this is related to hepatic encephalopathy versus infection in the setting of her diarrhea and possible cystitis; she is much improved and at baseline -CT of the head done which showed mild chronic ischemic white matter changes which is unchanged from prior no acute intracranial processes -Ammonia level was elevated at 52 and is improved to 40 -Continue to treat and evaluate for her diarrhea she has had no more diarrheal episodes so we have canceled her GI pathogen panel -If not improving will consider further work-up but will hold off -She is appropriate and at baseline -Continue to monitor and trend closely

## 2021-11-21 NOTE — Progress Notes (Signed)
ANTICOAGULATION CONSULT NOTE  Pharmacy Consult for heparin Indication: atrial fibrillation  No Known Allergies  Patient Measurements: Height: 5\' 5"  (165.1 cm) Weight: 113.6 kg (250 lb 7.1 oz) IBW/kg (Calculated) : 57 Heparin Dosing Weight: 84 kg   Vital Signs: BP: 104/71 (02/08 1240) Pulse Rate: 97 (02/08 1240)  Labs: Recent Labs    11/20/21 1536 11/20/21 1610 11/20/21 1611 11/20/21 1830 11/20/21 1944 11/21/21 0307 11/21/21 1317  HGB 11.4* 12.6 12.6  --   --  10.5*  --   HCT 34.9* 37.0 37.0  --   --  33.1*  --   PLT 175  --   --   --   --  176  --   LABPROT  --   --   --   --  13.7  --   --   INR  --   --   --   --  1.1  --   --   HEPARINUNFRC  --   --   --   --   --  <0.10* <0.10*  CREATININE 1.67*  --  1.70*  --   --  1.37*  --   TROPONINIHS 35*  --   --  11  --   --   --      Estimated Creatinine Clearance: 43.2 mL/min (A) (by C-G formula based on SCr of 1.37 mg/dL (H)).   Medical History: Past Medical History:  Diagnosis Date   Allergy    Anemia    Anxiety    Arthritis    hands, knees   Asthma    Breast cancer ([place])    right breast   Cataract    bilateral- surgery to remove   Depression    Diabetes mellitus without complication (HCC)    Heart murmur    never has caused any problems per patient   Hyperlipidemia    Hypertension    Personal history of radiation therapy    PONV (postoperative nausea and vomiting)    either   Radiation 02/02/14-03/17/14   Right Breast   Stroke [facility])    at age 47 yrs old, no problems since per patient    Assessment: 76YO female admitted for altered mental status. Upon arrival, patient was in atrial fibrillation with RVR. Past medical history significant for T2DM, HTN, and CVA. Not on anticoagulation prior to admission.   Heparin level undetectable, no infusion issues noted.  Goal of Therapy:  Heparin level 0.3-0.7 units/ml Monitor platelets by anticoagulation protocol: Yes   Plan:  Heparin bolus 3000 units  x1 Start heparin infusion at 1900 units/hr Heparin level in 8 hours  Arrie Senate, PharmD, [place], [facility] Clinical Pharmacist [phone] Please check AMION for all [place] numbers 11/21/2021

## 2021-11-22 ENCOUNTER — Encounter (HOSPITAL_COMMUNITY): Payer: Self-pay | Admitting: Internal Medicine

## 2021-11-22 ENCOUNTER — Other Ambulatory Visit (HOSPITAL_COMMUNITY): Payer: Self-pay

## 2021-11-22 DIAGNOSIS — E876 Hypokalemia: Secondary | ICD-10-CM

## 2021-11-22 DIAGNOSIS — I4891 Unspecified atrial fibrillation: Secondary | ICD-10-CM | POA: Diagnosis not present

## 2021-11-22 DIAGNOSIS — D696 Thrombocytopenia, unspecified: Secondary | ICD-10-CM

## 2021-11-22 DIAGNOSIS — D509 Iron deficiency anemia, unspecified: Secondary | ICD-10-CM

## 2021-11-22 LAB — CBC WITH DIFFERENTIAL/PLATELET
Abs Immature Granulocytes: 0.09 10*3/uL — ABNORMAL HIGH (ref 0.00–0.07)
Basophils Absolute: 0.1 10*3/uL (ref 0.0–0.1)
Basophils Relative: 1 %
Eosinophils Absolute: 0.1 10*3/uL (ref 0.0–0.5)
Eosinophils Relative: 1 %
HCT: 30.7 % — ABNORMAL LOW (ref 36.0–46.0)
Hemoglobin: 10 g/dL — ABNORMAL LOW (ref 12.0–15.0)
Immature Granulocytes: 1 %
Lymphocytes Relative: 11 %
Lymphs Abs: 0.8 10*3/uL (ref 0.7–4.0)
MCH: 26 pg (ref 26.0–34.0)
MCHC: 32.6 g/dL (ref 30.0–36.0)
MCV: 79.7 fL — ABNORMAL LOW (ref 80.0–100.0)
Monocytes Absolute: 0.9 10*3/uL (ref 0.1–1.0)
Monocytes Relative: 11 %
Neutro Abs: 5.8 10*3/uL (ref 1.7–7.7)
Neutrophils Relative %: 75 %
Platelets: 136 10*3/uL — ABNORMAL LOW (ref 150–400)
RBC: 3.85 MIL/uL — ABNORMAL LOW (ref 3.87–5.11)
RDW: 15.7 % — ABNORMAL HIGH (ref 11.5–15.5)
WBC: 7.7 10*3/uL (ref 4.0–10.5)
nRBC: 0 % (ref 0.0–0.2)

## 2021-11-22 LAB — COMPREHENSIVE METABOLIC PANEL
ALT: 12 U/L (ref 0–44)
AST: 11 U/L — ABNORMAL LOW (ref 15–41)
Albumin: 2 g/dL — ABNORMAL LOW (ref 3.5–5.0)
Alkaline Phosphatase: 112 U/L (ref 38–126)
Anion gap: 9 (ref 5–15)
BUN: 20 mg/dL (ref 8–23)
CO2: 24 mmol/L (ref 22–32)
Calcium: 8.5 mg/dL — ABNORMAL LOW (ref 8.9–10.3)
Chloride: 103 mmol/L (ref 98–111)
Creatinine, Ser: 1.04 mg/dL — ABNORMAL HIGH (ref 0.44–1.00)
GFR, Estimated: 55 mL/min — ABNORMAL LOW (ref >60)
Glucose, Bld: 181 mg/dL — ABNORMAL HIGH (ref 70–99)
Potassium: 3.4 mmol/L — ABNORMAL LOW (ref 3.5–5.1)
Sodium: 136 mmol/L (ref 135–145)
Total Bilirubin: 0.5 mg/dL (ref 0.3–1.2)
Total Protein: 5.6 g/dL — ABNORMAL LOW (ref 6.5–8.1)

## 2021-11-22 LAB — IRON AND TIBC
Iron: 26 ug/dL — ABNORMAL LOW (ref 28–170)
Saturation Ratios: 12 % (ref 10.4–31.8)
TIBC: 221 ug/dL — ABNORMAL LOW (ref 250–450)
UIBC: 195 ug/dL

## 2021-11-22 LAB — VITAMIN B12: Vitamin B-12: 336 pg/mL (ref 180–914)

## 2021-11-22 LAB — FOLATE: Folate: 11.2 ng/mL (ref >5.9)

## 2021-11-22 LAB — HEPARIN LEVEL (UNFRACTIONATED)
Heparin Unfractionated: 0.24 IU/mL — ABNORMAL LOW (ref 0.30–0.70)
Heparin Unfractionated: 0.38 IU/mL (ref 0.30–0.70)

## 2021-11-22 LAB — PHOSPHORUS: Phosphorus: 2.8 mg/dL (ref 2.5–4.6)

## 2021-11-22 LAB — GLUCOSE, CAPILLARY
Glucose-Capillary: 118 mg/dL — ABNORMAL HIGH (ref 70–99)
Glucose-Capillary: 154 mg/dL — ABNORMAL HIGH (ref 70–99)
Glucose-Capillary: 135 mg/dL — ABNORMAL HIGH (ref 70–99)
Glucose-Capillary: 119 mg/dL — ABNORMAL HIGH (ref 70–99)

## 2021-11-22 LAB — PROTIME-INR
INR: 1 (ref 0.8–1.2)
Prothrombin Time: 13.1 seconds (ref 11.4–15.2)

## 2021-11-22 LAB — AMMONIA: Ammonia: 43 umol/L — ABNORMAL HIGH (ref 9–35)

## 2021-11-22 LAB — MAGNESIUM: Magnesium: 1.7 mg/dL (ref 1.7–2.4)

## 2021-11-22 LAB — FERRITIN: Ferritin: 98 ng/mL (ref 11–307)

## 2021-11-22 LAB — HEMOGLOBIN A1C
Hgb A1c MFr Bld: 5.5 % (ref 4.8–5.6)
Mean Plasma Glucose: 111.15 mg/dL

## 2021-11-22 LAB — RETICULOCYTES
Immature Retic Fract: 9.3 % (ref 2.3–15.9)
RBC.: 3.82 MIL/uL — ABNORMAL LOW (ref 3.87–5.11)
Retic Count, Absolute: 24.4 10*3/uL (ref 19.0–186.0)
Retic Ct Pct: 0.6 % (ref 0.4–3.1)

## 2021-11-22 MED ORDER — MAGNESIUM SULFATE 2 GM/50ML IV SOLN
2.0000 g | Freq: Once | INTRAVENOUS | Status: AC
  Administered 2021-11-22: 2 g via INTRAVENOUS
  Filled 2021-11-22: qty 50

## 2021-11-22 MED ORDER — DILTIAZEM HCL 60 MG PO TABS
30.0000 mg | ORAL_TABLET | Freq: Four times a day (QID) | ORAL | Status: DC
  Administered 2021-11-22 – 2021-11-23 (×4): 30 mg via ORAL
  Filled 2021-11-22 (×4): qty 1

## 2021-11-22 NOTE — Evaluation (Signed)
Occupational Therapy Evaluation Patient Details Name: Meagan Wallace MRN: [id number] DOB: 07-Sep-1946 Today's Date: 11/22/2021   History of Present Illness 76 yo female presenting 2/7 with AMS and diarrhea x2 days. Upon work up, pt with new onset afib with RVR. PMH includes: DM II, HTN, anemia, depression, anxiety, and breast cancer.   Clinical Impression   PTA, pt was living alone and was performing BADLs and using rollator; reports her daughter-in-law Di Kindle) would assist with cooking/cleaning and younger brother insisted on driving her places. Pt currently requiring Min A for UB ADLs, Min A for LB ADLs, and Min A +2 for functional mobility with RW. Pt presenting with decreased cognition, balance, strength, and activity tolerance. Pt would benefit from further acute OT to facilitate safe dc. Recommend dc to SNF for further OT to optimize safety, independence with ADLs, and return to PLOF.    HR elevating to 145 (max) with peri care at sink. HR 120-130s with gait. HR 90 at rest     Recommendations for follow up therapy are one component of a multi-disciplinary discharge planning process, led by the attending physician.  Recommendations may be updated based on patient status, additional functional criteria and insurance authorization.   Follow Up Recommendations  Skilled nursing-short term rehab (<3 hours/day)    Assistance Recommended at Discharge Frequent or constant Supervision/Assistance  Patient can return home with the following A little help with walking and/or transfers;A little help with bathing/dressing/bathroom;Assistance with cooking/housework    Functional Status Assessment  Patient has had a recent decline in their functional status and demonstrates the ability to make significant improvements in function in a reasonable and predictable amount of time.  Equipment Recommendations  None recommended by OT    Recommendations for Other Services PT consult     Precautions  / Restrictions Precautions Precautions: Fall Restrictions Weight Bearing Restrictions: No      Mobility Bed Mobility Overal bed mobility: Needs Assistance Bed Mobility: Supine to Sit     Supine to sit: Min assist     General bed mobility comments: pt able to move LE to EOB but assist to pull up to sitting    Transfers Overall transfer level: Needs assistance Equipment used: Rolling walker (2 wheels) Transfers: Sit to/from Stand Sit to Stand: Min assist, +2 safety/equipment           General transfer comment: Min A for power up and then weight shift forward      Balance Overall balance assessment: Needs assistance Sitting-balance support: No upper extremity supported, Feet supported Sitting balance-Leahy Scale: Fair     Standing balance support: No upper extremity supported, During functional activity Standing balance-Leahy Scale: Fair Standing balance comment: static standing at sink                           ADL either performed or assessed with clinical judgement   ADL Overall ADL's : Needs assistance/impaired Eating/Feeding: Set up;Supervision/ safety;Sitting   Grooming: Minimal assistance;Standing Grooming Details (indicate cue type and reason): poor standing tolerance Upper Body Bathing: Minimal assistance;Sitting   Lower Body Bathing: Minimal assistance;Sit to/from stand   Upper Body Dressing : Set up;Supervision/safety;Sitting   Lower Body Dressing: Min guard;Bed level;Minimal assistance Lower Body Dressing Details (indicate cue type and reason): Pt using figure four method in bed with HOB elevated to don socks. Min A for standing balance Toilet Transfer: Minimal assistance;+2 for safety/equipment;Ambulation;Rolling walker (2 wheels)  Functional mobility during ADLs: Minimal assistance;+2 for safety/equipment;Rolling walker (2 wheels) General ADL Comments: Pt presenting with decreased cognition, awareness, balance, strength,  and activity tolerance     Vision Baseline Vision/History: 1 Wears glasses (All the time)       Perception     Praxis      Pertinent Vitals/Pain Pain Assessment Pain Assessment: Faces Faces Pain Scale: Hurts little more Pain Location: Generalized Pain Descriptors / Indicators: Constant, Discomfort Pain Intervention(s): Monitored during session, Limited activity within patient's tolerance, Repositioned     Hand Dominance Right   Extremity/Trunk Assessment Upper Extremity Assessment Upper Extremity Assessment: Generalized weakness   Lower Extremity Assessment Lower Extremity Assessment: Generalized weakness   Cervical / Trunk Assessment Cervical / Trunk Assessment: Kyphotic   Communication Communication Communication: No difficulties;HOH   Cognition Arousal/Alertness: Awake/alert Behavior During Therapy: WFL for tasks assessed/performed Overall Cognitive Status: Impaired/Different from baseline Area of Impairment: Attention, Orientation, Memory, Following commands, Safety/judgement, Awareness, Problem solving                 Orientation Level: Disoriented to, Time (Able to state Feb. Not knowing the year. Pt stating "I am having problems with these questions lately") Current Attention Level: Sustained, Selective Memory: Decreased short-term memory Following Commands: Follows one step commands inconsistently, Follows one step commands with increased time Safety/Judgement: Decreased awareness of safety, Decreased awareness of deficits Awareness: Intellectual Problem Solving: Slow processing, Requires verbal cues General Comments: Pt continues to present with decreased orienation (not recalling year). Needing increased cues and benefiting from calm environment with simple cues.     General Comments  HR elevating to 145 (max) with peri care at sink. HR majority in 120-130s with gait. At rest, HR 90    Exercises     Shoulder Instructions      Home Living  Family/patient expects to be discharged to:: Private residence Living Arrangements: Alone Available Help at Discharge: Family;Available PRN/intermittently Type of Home: House Home Access: Stairs to enter [employer] of Steps: 6 Entrance Stairs-Rails: Left       Bathroom Shower/Tub: Tub/shower unit;Curtain   Biochemist, clinical: Standard     Home Equipment: Air cabin crew (4 wheels);Rolling Walker (2 wheels)          Prior Functioning/Environment Prior Level of Function : Needs assist;History of Falls (last six months)       Physical Assist : Mobility (physical);ADLs (physical) Mobility (physical): Stairs;Gait ADLs (physical): IADLs Mobility Comments: pt reports use of rollator to "carry things" while moving in the home ADLs Comments: pt reports family comes to assist with meals        OT Problem List: Decreased range of motion;Decreased activity tolerance;Decreased strength;Impaired balance (sitting and/or standing);Decreased knowledge of precautions;Decreased knowledge of use of DME or AE      OT Treatment/Interventions: Self-care/ADL training;Therapeutic exercise;Energy conservation;DME and/or AE instruction;Therapeutic activities;Patient/family education    OT Goals(Current goals can be found in the care plan section) Acute Rehab OT Goals Patient Stated Goal: Get stronger OT Goal Formulation: With patient Time For Goal Achievement: 12/06/21 Potential to Achieve Goals: Good  OT Frequency: Min 2X/week    Co-evaluation   Reason for Co-Treatment: For patient/therapist safety;To address functional/ADL transfers;Necessary to address cognition/behavior during functional activity PT goals addressed during session: Mobility/safety with mobility;Balance;Strengthening/ROM;Proper use of DME OT goals addressed during session: ADL's and self-care      AM-PAC OT "6 Clicks" Daily Activity     Outcome Measure Help from another person eating meals?: None Help  from another  person taking care of personal grooming?: A Little Help from another person toileting, which includes using toliet, bedpan, or urinal?: A Little Help from another person bathing (including washing, rinsing, drying)?: A Little Help from another person to put on and taking off regular upper body clothing?: A Little Help from another person to put on and taking off regular lower body clothing?: A Little 6 Click Score: 19   End of Session Equipment Utilized During Treatment: Rolling walker (2 wheels) Nurse Communication: Mobility status  Activity Tolerance: Patient tolerated treatment well Patient left: in chair;with call bell/phone within reach;with chair alarm set  OT Visit Diagnosis: Unsteadiness on feet (R26.81);Other abnormalities of gait and mobility (R26.89);Muscle weakness (generalized) (M62.81)                Time: 3606-7703 OT Time Calculation (min): 31 min Charges:  OT General Charges $OT Visit: 1 Visit OT Evaluation $OT Eval Moderate Complexity: [place], OTR/L Acute Rehab Pager: [phone] Office: [place] 11/22/2021, 5:33 PM

## 2021-11-22 NOTE — Consult Note (Signed)
Cardiology Consultation:   Patient ID: Meagan Wallace MRN: [id number]; DOB: Jan 01, 1946  Admit date: 11/20/2021 Date of Consult: 11/22/2021  PCP:  Charlynn Court, NP   [facility] Providers Cardiologist:  Dr Stanford Breed     Patient Profile:   Meagan Wallace is a 76 y.o. female with a hx of breast cancer, cirrhosis diabetes mellitus, hypertension, hyperlipidemia, question CVA admitted with diarrhea who is being seen 11/22/2021 for the evaluation of atrial fibrillation at the request of Kerney Elbe, DO.  History of Present Illness:   Patient was admitted February 7 after presenting to the emergency room with diarrhea.  Patient also had lowered herself to the floor due to weakness and was unable to get up.  Also noted to be in atrial fibrillation with rapid ventricular response.  Blood pressure also decreased and she was noted to have a urinary tract infection and antibiotics initiated.  Patient denies dyspnea, chest pain, palpitations or syncope.  Question minimal pedal edema.  Because of her atrial fibrillation cardiology is now asked to evaluate.   Past Medical History:  Diagnosis Date   Allergy    Anemia    Anxiety    Arthritis    hands, knees   Asthma    Atrial fibrillation (HCC)    Breast cancer (HCC)    right breast   Cataract    bilateral- surgery to remove   Depression    Diabetes mellitus without complication (HCC)    Heart murmur    never has caused any problems per patient   Hyperlipidemia    Hypertension    Personal history of radiation therapy    PONV (postoperative nausea and vomiting)    either   Radiation 02/02/14-03/17/14   Right Breast   Stroke [facility])    at age 45 yrs old, no problems since per patient    Past Surgical History:  Procedure Laterality Date   APPENDECTOMY  1987   BIOPSY  06/12/2021   Procedure: BIOPSY;  Surgeon: Otis Brace, MD;  Location: [facility];  Service: Gastroenterology;;   BREAST BIOPSY Right    BREAST  LUMPECTOMY     right breast 2015   BREAST LUMPECTOMY WITH RADIOACTIVE SEED LOCALIZATION Right 12/27/2013   Procedure: BREAST LUMPECTOMY WITH RADIOACTIVE SEED LOCALIZATION;  Surgeon: Marcello Moores A. Cornett, MD;  Location: [place];  Service: General;  Laterality: Right;   COLONOSCOPY  10/27/2017   polyps/Danis   COLONOSCOPY WITH PROPOFOL N/A 06/12/2021   Procedure: COLONOSCOPY WITH PROPOFOL;  Surgeon: Otis Brace, MD;  Location: [facility];  Service: Gastroenterology;  Laterality: N/A;   ESOPHAGOGASTRODUODENOSCOPY (EGD) WITH PROPOFOL N/A 06/12/2021   Procedure: ESOPHAGOGASTRODUODENOSCOPY (EGD) WITH PROPOFOL;  Surgeon: Otis Brace, MD;  Location: [facility];  Service: Gastroenterology;  Laterality: N/A;   EXPLORATORY LAPAROTOMY  1987   EYE SURGERY Bilateral    cataracts removed   POLYPECTOMY  06/12/2021   Procedure: POLYPECTOMY;  Surgeon: Otis Brace, MD;  Location: [facility];  Service: Gastroenterology;;   SHOULDER SURGERY  2011   right     Inpatient Medications: Scheduled Meds:  diltiazem  30 mg Oral Q6H   insulin aspart  0-5 Units Subcutaneous QHS   insulin aspart  0-9 Units Subcutaneous TID WC   Continuous Infusions:  cefTRIAXone (ROCEPHIN)  IV 2 g (11/21/21 1612)   heparin 2,100 Units/hr (11/22/21 0055)   lactated ringers 75 mL/hr at 11/21/21 1945   PRN Meds: acetaminophen, ondansetron (ZOFRAN) IV  Allergies:   No Known Allergies  Social History:   Social History   Socioeconomic History   Marital status: Widowed    Spouse name: Not on file   Number of children: 0   Years of education: Not on file   Highest education level: Not on file  Occupational History   Not on file  Tobacco Use   Smoking status: Never   Smokeless tobacco: Never  Vaping Use   Vaping Use: Never used  Substance and Sexual Activity   Alcohol use: No   Drug use: No   Sexual activity: Not Currently    Birth control/protection: Post-menopausal  Other Topics  Concern   Not on file  Social History Narrative   Husband passed away in 01-17-2015   Social Determinants of Health   Financial Resource Strain: Not on file  Food Insecurity: Not on file  Transportation Needs: Not on file  Physical Activity: Not on file  Stress: Not on file  Social Connections: Not on file  Intimate Partner Violence: Not on file    Family History:    Family History  Problem Relation Age of Onset   Heart disease Mother    Stroke Father    Breast cancer Paternal Aunt    Colon cancer Neg Hx    Esophageal cancer Neg Hx    Rectal cancer Neg Hx    Stomach cancer Neg Hx      ROS:  Please see the history of present illness.  Diarrhea and weakness but no fevers, chills, melena or hematochezia. All other ROS reviewed and negative.     Physical Exam/Data:   Vitals:   11/22/21 0439 11/22/21 0609 11/22/21 0759 11/22/21 0839  BP: 122/80 (!) 129/91  139/86  Pulse: (!) 101     Resp: (!) 22   18  Temp: 97.9 F (36.6 C)   98 F (36.7 C)  TempSrc: Oral   Oral  SpO2: 95% 94% 94% 94%  Weight:      Height:        Intake/Output Summary (Last 24 hours) at 11/22/2021 1106 Last data filed at 11/22/2021 0700 Gross per 24 hour  Intake 2449.48 ml  Output 550 ml  Net 1899.48 ml   Last 3 Weights 11/20/2021 08/04/2021 06/12/2021  Weight (lbs) 250 lb 7.1 oz 249 lb 1.9 oz 226 lb  Weight (kg) 113.6 kg 113 kg 102.513 kg     Body mass index is 41.68 kg/m.  General:  Well nourished, obese, in no acute distress HEENT: normal Neck: no JVD Vascular: No carotid bruits; Distal pulses 2+ bilaterally Cardiac:  normal S1, S2; irregular, no murmur Lungs:  clear to auscultation bilaterally, no wheezing, rhonchi or rales  Abd: soft, nontender, no hepatomegaly  Ext: no edema Musculoskeletal:  No deformities, BUE and BLE strength normal and equal Skin: warm and dry  Neuro:  CNs 2-12 intact, no focal abnormalities noted Psych:  Normal affect   EKG:  The EKG was personally reviewed and  demonstrates: Atrial fibrillation, left ventricular hypertrophy, cannot rule out anterior infarct versus lead reversal. Telemetry:  Telemetry was personally reviewed and demonstrates: Atrial fibrillation with rate upper normal to mildly increased.  Laboratory Data:  High Sensitivity Troponin:   Recent Labs  Lab 11/20/21 1536 11/20/21 1830  TROPONINIHS 35* 11     Chemistry Recent Labs  Lab 11/20/21 1536 11/20/21 1610 11/20/21 1611 11/20/21 2122-01-16 11/21/21 0307  NA 131* 133* 133*  --  134*  K 4.8 4.0 4.0  --  3.6  CL 98  --  100  --  99  CO2 22  --   --   --  23  GLUCOSE 93  --  98  --  163*  BUN 52*  --  62*  --  44*  CREATININE 1.67*  --  1.70*  --  1.37*  CALCIUM 9.2  --   --   --  8.9  MG  --   --   --  1.8 1.7  GFRNONAA 31*  --   --   --  40*  ANIONGAP 11  --   --   --  12    Recent Labs  Lab 11/20/21 1536 11/21/21 0307  PROT 6.1* 5.5*  ALBUMIN 2.4* 2.1*  AST 24 11*  ALT 15 12  ALKPHOS 127* 103  BILITOT 2.5* 0.4    Hematology Recent Labs  Lab 11/20/21 1536 11/20/21 1610 11/20/21 1611 11/21/21 0307  WBC 16.5*  --   --  12.7*  RBC 4.32  --   --  4.03  HGB 11.4* 12.6 12.6 10.5*  HCT 34.9* 37.0 37.0 33.1*  MCV 80.8  --   --  82.1  MCH 26.4  --   --  26.1  MCHC 32.7  --   --  31.7  RDW 15.8*  --   --  15.7*  PLT 175  --   --  176   Thyroid  Recent Labs  Lab 11/20/21 1536 11/20/21 1642  TSH  --  2.014  FREET4 1.59*  --     BNP Recent Labs  Lab 11/20/21 1642  BNP 184.3*    DDimer  Recent Labs  Lab 11/20/21 1536  DDIMER 1.79*     Radiology/Studies:  DG Chest 2 View  Result Date: 11/20/2021 CLINICAL DATA:  Altered mental status. EXAM: CHEST - 2 VIEW COMPARISON:  May 06, 2017. FINDINGS: Stable cardiomegaly. Both lungs are clear. The visualized skeletal structures are unremarkable. IMPRESSION: No active cardiopulmonary disease. Electronically Signed   By: Marijo Conception M.D.   On: 11/20/2021 16:47   CT HEAD WO CONTRAST  Result Date:  11/20/2021 CLINICAL DATA:  Mental status change. Unknown cause. Uncovertebral diarrhea for the past 2 days. EXAM: CT HEAD WITHOUT CONTRAST TECHNIQUE: Contiguous axial images were obtained from the base of the skull through the vertex without intravenous contrast. RADIATION DOSE REDUCTION: This exam was performed according to the departmental dose-optimization program which includes automated exposure control, adjustment of the mA and/or kV according to patient size and/or use of iterative reconstruction technique. COMPARISON:  CT brain 08/04/2021 FINDINGS: Brain: The ventricles are normal in size and configuration. The basilar cisterns are patent. No mass, mass effect, or midline shift. No acute intracranial hemorrhage is seen. No abnormal extra-axial fluid collection. Mild periventricular white matter hypodensities are similar to prior, likely chronic ischemic white matter changes. There are atherosclerotic intracranial calcifications. Preservation of the normal cortical gray-white interface without CT evidence of an acute major vascular territorial cortical based infarction. Vascular: No hyperdense vessel or unexpected calcification. Skull: Normal. Negative for fracture or focal lesion. Sinuses/Orbits: Status post bilateral orbital lens replacements. The visualized paranasal sinuses and mastoid air cells are clear. Other: None. IMPRESSION:: IMPRESSION: 1. Mild chronic ischemic white matter changes, unchanged from prior. 2. No acute intracranial process. Electronically Signed   By: Yvonne Kendall M.D.   On: 11/20/2021 17:38   CT Angio Chest PE W and/or Wo Contrast  Result Date: 11/20/2021 CLINICAL DATA:  Pulmonary embolus suspected. Positive D-dimer. Altered mental status.  EXAM: CT ANGIOGRAPHY CHEST WITH CONTRAST TECHNIQUE: Multidetector CT imaging of the chest was performed using the standard protocol during bolus administration of intravenous contrast. Multiplanar CT image reconstructions and MIPs were obtained  to evaluate the vascular anatomy. RADIATION DOSE REDUCTION: This exam was performed according to the departmental dose-optimization program which includes automated exposure control, adjustment of the mA and/or kV according to patient size and/or use of iterative reconstruction technique. CONTRAST:  71mL OMNIPAQUE IOHEXOL 350 MG/ML SOLN COMPARISON:  Chest radiograph 11/20/2021 FINDINGS: Cardiovascular: There is good opacification of the central and segmental pulmonary arteries. No focal filling defects. No evidence of significant pulmonary embolus. Normal caliber thoracic aorta. Normal heart size. No pericardial effusions. Mediastinum/Nodes: Thyroid gland is unremarkable. No significant lymphadenopathy. Esophagus is decompressed. Lungs/Pleura: Areas of linear atelectasis or consolidation in both lung bases. Possibly pneumonia. Right lower lobe pulmonary nodule measuring 5 mm diameter. No pleural effusions. No pneumothorax. Upper Abdomen: Changes of hepatic cirrhosis with nodular liver contour. Enlarged lateral segment left and caudate lobes. Spleen appears also enlarged. Surgical absence of the gallbladder. Circumscribed fat and calcification in the right breast likely representing fat necrosis. Possibly an old biopsy cavity or old hematoma. Musculoskeletal: No chest wall abnormality. No acute or significant osseous findings. Review of the MIP images confirms the above findings. IMPRESSION: 1. No evidence of significant pulmonary embolus. 2. Areas of atelectasis or consolidation in both lower lungs. 3. Hepatic cirrhosis with splenic enlargement. 4. Focal area of fat necrosis in the right breast. Electronically Signed   By: Lucienne Capers M.D.   On: 11/20/2021 23:07   ECHOCARDIOGRAM COMPLETE  Result Date: 11/21/2021    ECHOCARDIOGRAM REPORT   Patient Name:   ANAJAH STERBENZ Date of Exam: 11/21/2021 Medical Rec #:  [id number]           Height:       65.0 in Accession #:    [id number]          Weight:       250.4  lb Date of Birth:  1946/07/26          BSA:          2.177 m Patient Age:    55 years            BP:           104/71 mmHg Patient Gender: F                   HR:           76 bpm. Exam Location:  Inpatient Procedure: 2D Echo Indications:    Afib  History:        Patient has no prior history of Echocardiogram examinations.                 Arrythmias:Atrial Fibrillation; Risk Factors:Diabetes and                 Hypertension.  Sonographer:    Jefferey Pica Referring Phys: [place]  1. Left ventricular ejection fraction, by estimation, is 55 to 60%. The left ventricle has normal function. The left ventricle has no regional wall motion abnormalities. There is mild left ventricular hypertrophy. Left ventricular diastolic parameters are indeterminate.  2. Right ventricular systolic function is normal. The right ventricular size is normal. There is normal pulmonary artery systolic pressure.  3. The mitral valve is grossly normal. No evidence of mitral valve regurgitation.  4. The aortic valve is grossly normal. Aortic  valve regurgitation is not visualized.  5. The inferior vena cava is normal in size with greater than 50% respiratory variability, suggesting right atrial pressure of 3 mmHg. Comparison(s): No prior Echocardiogram. FINDINGS  Left Ventricle: Left ventricular ejection fraction, by estimation, is 55 to 60%. The left ventricle has normal function. The left ventricle has no regional wall motion abnormalities. The left ventricular internal cavity size was normal in size. There is  mild left ventricular hypertrophy. Left ventricular diastolic parameters are indeterminate. Right Ventricle: The right ventricular size is normal. No increase in right ventricular wall thickness. Right ventricular systolic function is normal. There is normal pulmonary artery systolic pressure. The tricuspid regurgitant velocity is 1.01 m/s, and  with an assumed right atrial pressure of 3 mmHg, the estimated right  ventricular systolic pressure is 7.1 mmHg. Left Atrium: Left atrial size was normal in size. Right Atrium: Right atrial size was normal in size. Pericardium: There is no evidence of pericardial effusion. Mitral Valve: The mitral valve is grossly normal. No evidence of mitral valve regurgitation. Tricuspid Valve: The tricuspid valve is grossly normal. Tricuspid valve regurgitation is not demonstrated. Aortic Valve: The aortic valve is grossly normal. Aortic valve regurgitation is not visualized. Aortic valve peak gradient measures 16.0 mmHg. Pulmonic Valve: Pulmonic valve regurgitation is not visualized. Aorta: The aortic root and ascending aorta are structurally normal, with no evidence of dilitation. Venous: The inferior vena cava is normal in size with greater than 50% respiratory variability, suggesting right atrial pressure of 3 mmHg. IAS/Shunts: No atrial level shunt detected by color flow Doppler.  LEFT VENTRICLE PLAX 2D LVIDd:         4.50 cm LVIDs:         3.20 cm LV PW:         1.10 cm LV IVS:        1.20 cm LVOT diam:     2.00 cm LV SV:         71 LV SV Index:   33 LVOT Area:     3.14 cm  RIGHT VENTRICLE            IVC RV Basal diam:  2.40 cm    IVC diam: 1.80 cm RV S prime:     9.00 cm/s TAPSE (M-mode): 1.3 cm LEFT ATRIUM             Index        RIGHT ATRIUM           Index LA diam:        4.30 cm 1.98 cm/m   RA Area:     11.50 cm LA Vol (A2C):   57.5 ml 26.42 ml/m  RA Volume:   21.20 ml  9.74 ml/m LA Vol (A4C):   53.7 ml 24.67 ml/m LA Biplane Vol: 59.5 ml 27.34 ml/m  AORTIC VALVE                 PULMONIC VALVE AV Area (Vmax): 2.23 cm     PV Vmax:       0.91 m/s AV Vmax:        200.00 cm/s  PV Peak grad:  3.3 mmHg AV Peak Grad:   16.0 mmHg LVOT Vmax:      142.00 cm/s LVOT Vmean:     85.867 cm/s LVOT VTI:       0.226 m  AORTA Ao Root diam: 3.20 cm Ao Asc diam:  3.50 cm MITRAL VALVE  TRICUSPID VALVE MV Area (PHT): 4.57 cm     TR Peak grad:   4.1 mmHg MV Decel Time: 166 msec     TR  Vmax:        101.00 cm/s MV E velocity: 117.00 cm/s MV A velocity: 109.00 cm/s  SHUNTS MV E/A ratio:  1.07         Systemic VTI:  0.23 m                             Systemic Diam: 2.00 cm Phineas Inches Electronically signed by Phineas Inches Signature Date/Time: 11/21/2021/4:44:49 PM    Final      Assessment and Plan:   Newly diagnosed atrial fibrillation-patient is in atrial fibrillation of uncertain duration.  She was noted to be in this on arrival to the emergency room.  She has not had dyspnea, chest pain or palpitations.  I will add Cardizem 30 mg by mouth every 6 hours.  If she tolerates will transition to 120 mg daily tomorrow.  CHA2DS2-VASc is 7.  She is presently on heparin.  Will transition to apixaban 5 mg twice daily at discharge.  Note LV function is normal on echocardiogram.  We will see back in the office and consider proceeding with cardioversion to see if she would hold sinus rhythm (she would need to be therapeutically anticoagulated for greater than 21 days).  If she does not hold sinus rhythm then rate control and anticoagulation may be best option as she is asymptomatic. Elevated free T4-TSH is normal.  We will repeat. Cirrhosis-management per primary service. Diarrhea-management per primary service.   Risk Assessment/Risk Scores:       :[id number]}   CHA2DS2-VASc Score = 7  This indicates a 11.2% annual risk of stroke. The patient's score is based upon: CHF History: 0 HTN History: 1 Diabetes History: 1 Stroke History: 2 Vascular Disease History: 0 Age Score: 2 Gender Score: 1        For questions or updates, please contact [place] Please consult [email or web address] for contact info under    Signed, Kirk Ruths, MD  11/22/2021 11:06 AM

## 2021-11-22 NOTE — Progress Notes (Signed)
ANTICOAGULATION CONSULT NOTE  Pharmacy Consult for heparin Indication: atrial fibrillation  No Known Allergies  Patient Measurements: Height: _0  (165.1 cm) Weight: 113.6 kg (250 lb 7.1 oz) IBW/kg (Calculated) : 57 Heparin Dosing Weight: 84 kg   Vital Signs: Temp: 98.4 F (36.9 C) (02/09 1139) Temp Source: Oral (02/09 1139) BP: 122/70 (02/09 1139) Pulse Rate: 108 (02/09 1139)  Labs: Recent Labs    11/20/21 1536 11/20/21 1610 11/20/21 1611 11/20/21 1611 11/20/21 1830 11/20/21 1944 11/21/21 0307 11/21/21 1317 11/21/21 2302 11/22/21 0959  HGB 11.4*   < > 12.6  --   --   --  10.5*  --   --  10.0*  HCT 34.9*   < > 37.0  --   --   --  33.1*  --   --  30.7*  PLT 175  --   --   --   --   --  176  --   --  136*  LABPROT  --   --   --   --   --  13.7  --   --   --  13.1  INR  --   --   --   --   --  1.1  --   --   --  1.0  HEPARINUNFRC  --   --   --    < >  --   --  <0.10* <0.10* 0.17* 0.24*  CREATININE 1.67*  --  1.70*  --   --   --  1.37*  --   --  1.04*  TROPONINIHS 35*  --   --   --  11  --   --   --   --   --    < > = values in this interval not displayed.     Estimated Creatinine Clearance: 56.9 mL/min (A) (by C-G formula based on SCr of 1.04 mg/dL (H)).   Medical History: Past Medical History:  Diagnosis Date   Allergy    Anemia    Anxiety    Arthritis    hands, knees   Asthma    Atrial fibrillation (HCC)    Breast cancer (HCC)    right breast   Cataract    bilateral- surgery to remove   Depression    Diabetes mellitus without complication (HCC)    Heart murmur    never has caused any problems per patient   Hyperlipidemia    Hypertension    Personal history of radiation therapy    PONV (postoperative nausea and vomiting)    either   Radiation 02/02/14-03/17/14   Right Breast   Stroke [facility])    at age 5 yrs old, no problems since per patient    Assessment: 76YO female admitted for altered mental status. Upon arrival, patient was in atrial  fibrillation with RVR. Past medical history significant for T2DM, HTN, and CVA. Not on anticoagulation prior to admission.  -heparin level= 0.24 on 2100 units/hr -apixaban with high cost but this is due to a deductible which should change after the deductible is met  Goal of Therapy:  Heparin level 0.3-0.7 units/ml Monitor platelets by anticoagulation protocol: Yes   Plan:  -Increase heparin to 2300 units/hr -Heparin level in 8 hours and daily wth CBC daily  Hildred Laser, PharmD Clinical Pharmacist **Pharmacist phone directory can now be found on [email or web address] ([email or web address]).  Listed under [place].

## 2021-11-22 NOTE — Progress Notes (Signed)
ANTICOAGULATION CONSULT NOTE  Pharmacy Consult for heparin Indication: atrial fibrillation  No Known Allergies  Patient Measurements: Height: 5' 5"  (165.1 cm) Weight: 113.6 kg (250 lb 7.1 oz) IBW/kg (Calculated) : 57 Heparin Dosing Weight: 84 kg   Vital Signs: Temp: 98.5 F (36.9 C) (02/09 2015) Temp Source: Oral (02/09 2015) BP: 136/75 (02/09 2015) Pulse Rate: 90 (02/09 2015)  Labs: Recent Labs    11/20/21 1536 11/20/21 1610 11/20/21 1611 11/20/21 1830 11/20/21 1944 11/21/21 0307 11/21/21 1317 11/21/21 2302 11/22/21 0959 11/22/21 2046  HGB 11.4*   < > 12.6  --   --  10.5*  --   --  10.0*  --   HCT 34.9*   < > 37.0  --   --  33.1*  --   --  30.7*  --   PLT 175  --   --   --   --  176  --   --  136*  --   LABPROT  --   --   --   --  13.7  --   --   --  13.1  --   INR  --   --   --   --  1.1  --   --   --  1.0  --   HEPARINUNFRC  --   --   --   --   --  <0.10*   < > 0.17* 0.24* 0.38  CREATININE 1.67*  --  1.70*  --   --  1.37*  --   --  1.04*  --   TROPONINIHS 35*  --   --  11  --   --   --   --   --   --    < > = values in this interval not displayed.     Estimated Creatinine Clearance: 56.9 mL/min (A) (by C-G formula based on SCr of 1.04 mg/dL (H)).   Medical History: Past Medical History:  Diagnosis Date   Allergy    Anemia    Anxiety    Arthritis    hands, knees   Asthma    Atrial fibrillation (HCC)    Breast cancer (HCC)    right breast   Cataract    bilateral- surgery to remove   Depression    Diabetes mellitus without complication (HCC)    Heart murmur    never has caused any problems per patient   Hyperlipidemia    Hypertension    Personal history of radiation therapy    PONV (postoperative nausea and vomiting)    either   Radiation 02/02/14-03/17/14   Right Breast   Stroke [facility])    at age 15 yrs old, no problems since per patient    Assessment: 76YO female admitted for altered mental status. Upon arrival, patient was in atrial  fibrillation with RVR. Past medical history significant for T2DM, HTN, and CVA. Not on anticoagulation prior to admission.   Heparin level came back therapeutic at 0.38, on 2300 units/hr. No s/sx of bleeding or infusion issues per nursing.   Apixaban with high cost but this is due to a deductible which should change after the deductible is met.  Goal of Therapy:  Heparin level 0.3-0.7 units/ml Monitor platelets by anticoagulation protocol: Yes   Plan:  -Continue heparin at 2300 units/hr -Heparin level daily wth CBC daily  Antonietta Jewel, PharmD, [place] Pharmacist  Phone: [phone] 11/22/2021 9:52 PM  Please check AMION for all [facility] phone numbers  After 10:00 PM, call [place] [phone]

## 2021-11-22 NOTE — Assessment & Plan Note (Addendum)
-  Patient's platelet count dropped and went from 175 -> 176 -> 136 -> 142 -Monitor for signs and symptoms bleeding as patient is now on anticoagulation; no overt bleeding noted -Repeat CBC within 1 week

## 2021-11-22 NOTE — Progress Notes (Signed)
ANTICOAGULATION CONSULT NOTE  Pharmacy Consult for Heparin Indication: atrial fibrillation  No Known Allergies  Patient Measurements: Height: 5\' 5"  (165.1 cm) Weight: 113.6 kg (250 lb 7.1 oz) IBW/kg (Calculated) : 57 Heparin Dosing Weight: 84 kg   Vital Signs: Temp: 97.9 F (36.6 C) (02/08 2337) Temp Source: Oral (02/08 2337) BP: 126/70 (02/08 2337) Pulse Rate: 10 (02/08 2337)  Labs: Recent Labs    11/20/21 1536 11/20/21 1610 11/20/21 1611 11/20/21 1830 11/20/21 1944 11/21/21 0307 11/21/21 1317 11/21/21 2302  HGB 11.4* 12.6 12.6  --   --  10.5*  --   --   HCT 34.9* 37.0 37.0  --   --  33.1*  --   --   PLT 175  --   --   --   --  176  --   --   LABPROT  --   --   --   --  13.7  --   --   --   INR  --   --   --   --  1.1  --   --   --   HEPARINUNFRC  --   --   --   --   --  <0.10* <0.10* 0.17*  CREATININE 1.67*  --  1.70*  --   --  1.37*  --   --   TROPONINIHS 35*  --   --  11  --   --   --   --      Estimated Creatinine Clearance: 43.2 mL/min (A) (by C-G formula based on SCr of 1.37 mg/dL (H)).   Medical History: Past Medical History:  Diagnosis Date   Allergy    Anemia    Anxiety    Arthritis    hands, knees   Asthma    Breast cancer ([place])    right breast   Cataract    bilateral- surgery to remove   Depression    Diabetes mellitus without complication (HCC)    Heart murmur    never has caused any problems per patient   Hyperlipidemia    Hypertension    Personal history of radiation therapy    PONV (postoperative nausea and vomiting)    either   Radiation 02/02/14-03/17/14   Right Breast   Stroke [facility])    at age 33 yrs old, no problems since per patient    Assessment: 76YO female admitted for altered mental status. Upon arrival, patient was in atrial fibrillation with RVR. Past medical history significant for T2DM, HTN, and CVA. Not on anticoagulation prior to admission.   Heparin level undetectable, no infusion issues noted.  2/9 AM update:   Heparin level low but trending up  Goal of Therapy:  Heparin level 0.3-0.7 units/ml Monitor platelets by anticoagulation protocol: Yes   Plan:  Inc heparin to 2100 units/hr Re-check heparin level in 8 hours  Narda Bonds, PharmD, [place] Pharmacist Phone: [phone]

## 2021-11-22 NOTE — Progress Notes (Signed)
Progress Note   Patient: Meagan Wallace SWF:[id number] DOB: June 18, 1946 DOA: 11/20/2021     2 DOS: the patient was seen and examined on 11/22/2021   Brief hospital course: Patient is a 76 year old morbidly obese Caucasian female with a past medical history significant for but not limited to diabetes mellitus type 2, hypertension, hyperlipidemia, history of cirrhosis, history of breast cancer, history of depression and anxiety, history of CVA as well as other comorbidities resented the ED with a 2-day history of diarrhea.  Patient lives by herself and the patient's family was concerned that patient may have had a UTI.  No family is at bedside initially but she came to the ED for her diarrhea and was found to be in new onset A-fib with RVR.  Blood pressures on the lower side and a code sepsis was called.  She was given IV fluid resuscitation and I suspected that she had a UTI so started on antibiotics.  She was found to have an AKI but now her AKI is improving with fluid hydration.  Due to her AKI, rapid atrial fibrillation with acute diarrhea tried hospitalist team was consulted for admission.  She was given fluid boluses and started on IV Rocephin which we will continue.  She was admitted to the progressive bed in the ED and per report the EDP consulted cardiology for new onset atrial fibrillation but I do not see a consult placed so we will call them now.  Assessment and Plan: * Atrial fibrillation with rapid ventricular response ([place])- (present on admission) Admit to progressive bed and she was started on IV lopressor ordered in ER and given 5 mg x 1.  -HR 100-110s. If sustained HR >100, would consider IV cardizem gtts at 5 mg/hr without bolus.  -Checked TSH and was 2.014 -Mag level was 1.7 so will replete with IV mag sulfate 2 g -Echocardiogram has been ordered and showed left ventricular ejection fraction 55 to 60% with no regional wall motion abnormalities and mild LVH with the left ventricular  diastolic primary being indeterminate and right ventricular systolic function being normal -Currently anticoagulated with a heparin drip -CHA2DS2-VASc score was at least a 5 -Continue with heparin drip for now and change to p.o. anticoagulation -We will consult cardiology for further evaluation and establishment for follow-up -Continue to monitor on telemetry     Acute encephalopathy -Unclear if this is related to hepatic encephalopathy versus infection in the setting of her diarrhea and possible cystitis -CT of the head done which showed mild chronic ischemic white matter changes which is unchanged from prior no acute intracranial processes -Ammonia level was elevated at 52 but will repeat in the morning -Continue to treat and evaluate for her diarrhea -If not improving will consider further work-up -Patient appeared to be appropriate this morning but was very lethargic. -Continue to monitor and trend closely  Hyponatremia -Patient's Na+ went from 133 -> 134 -Continue with IV fluid hydration with lactated Ringer's at 75 MLS per hour -Repeat CMP in the a.m.   Normocytic anemia - Patient's hemoglobin/hematocrit went from 12.6/37.0 and is now 10.5/33.1 -Check anemia panel in the a.m. -Continue to monitor for signs and symptoms of bleeding; currently no overt bleeding noted -Repeat CBC in a.m.  Leukocytosis -WBC went from 16.5 -> 12.7 -Likely reactive and she is likely hemoconcentrated on admission in the setting of dehydration from her diarrhea -Continue monitor for signs and symptoms and she is already on empiric antibiotics with IV ceftriaxone for suspected cystitis -Repeat  CBC in the a.m.  Hyperammonemia (HCC) -Ammonia level was 52 on admission -We will hold off giving lactulose given her significant amount of diarrhea  Pressure injury of skin -poA -Consult WOC nurse for further evaluation and recommendations  Acute diarrhea -Checking GI viral  Panel.  Enteric  precautions. -WBC improving and went from 16.5 and is now 12.7 but she is likely hemoconcentrated on admission from dehydration -GI pathogen panel still has not been sent  AKI (acute kidney injury) ([place]) -Received a lactated Ringer's bolus 1 time in the ED and was not started on any maintenance IV fluids so we will start her on 75 mL -AKI likely caused by combination of volume loss from diarrhea and continued ACE inhibitor use. -Patient's BUNs/creatinine went from 62/1.70 is now 44/1.34 -Avoid further nephrotoxic medications, contrast dyes, hypotension and renally dose medications -Repeat CMP in the a.m.  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 -LFTs and bilirubin are normal at this time -Continue monitor for decompensation and repeat CMP in a.m. -Check PT and INR in the morning  HTN (hypertension)- (present on admission) -Currently Holdin BP meds in face of acute kidney injury and hypotension on admission -Continue to monitor blood pressures per protocol -She was given IV metoprolol tartrate 5 mg once yesterday evening -Last blood pressure reading was sided 104/71  Obesity, Class III, BMI 40-49.9 (morbid obesity) ([place])- (present on admission) -Complicates overall prognosis and care -Estimated body mass index is 41.68 kg/m as calculated from the following:   Height as of this encounter: 5\' 5"  (1.651 m).   Weight as of this encounter: 113.6 kg.  -Weight Loss and Dietary Counseling given   Type 2 diabetes mellitus without complications (HCC) -Hold oral medications as well as  holding Ozempic -Initiate on sensitive NovoLog sign scale insulin before meals and at bedtime and adjust insulin as necessary -Check hemoglobin A1c in the morning -Continue monitor CBGs per protocol; CBGs not done yet but blood sugar on CMP this morning was 163  Ductal carcinoma in situ (DCIS) of right breast- (present on admission) -CTA PE done and showed Circumscribed fat and calcification in the right breast likely representing fat necrosis. Possibly an old biopsy cavity or old Hematoma." -No longer taking tamoxifen        Subjective: Seen and examined at bedside and she was doing fairly well.  She is much more awake and alert.  States that she had no more diarrheal episodes.  No chest pain or shortness of breath.  Heart rate still a little uncontrolled.  No other concerns or plans at this time.  Physical Exam: Vitals:   11/22/21 1139 11/22/21 1235 11/22/21 1301 11/22/21 1557  BP: 122/70   140/78  Pulse: (!) 108 95 90   Resp: 20   18  Temp: 98.4 F (36.9 C)   98.7 F (37.1 C)  TempSrc: Oral   Oral  SpO2: 95% 92% 93%   Weight:      Height:       Examination: Physical Exam:  Constitutional: WN/WD morbidly obese Caucasian female currently in no acute distress appears calm Respiratory: Slightly diminished to auscultation bilaterally, no wheezing,  rales, rhonchi or crackles. Normal respiratory effort and patient is not tachypenic. No accessory muscle use.  Unlabored breathing Cardiovascular: RRR, no murmurs / rubs / gallops. S1 and S2 auscultated.  Slight extremity edema Abdomen: Soft, non-tender, distended secondary body habitus. Bowel sounds positive.  GU: Deferred.    Data Reviewed: I have independently reviewed and interpreted the patient's laboratory data with her CMP and CBC with her iron panel Iron level is on the lower side at 26 and she has a microcytic anemia as well as a thrombocytopenia.  Her potassium is now 3.4 and her renal function is improved with a  BUN/creatinine of 20/1.04  Family Communication: No family present at bedside  Disposition: Status is: Inpatient Remains inpatient appropriate because: Still being worked up and evaluated by cardiology.  Will need PT OT to further evaluate and treat and have cardiology make some final recommendations for discharge   Planned Discharge Destination: Skilled nursing facility  DVT Prophylaxis: Anticoag with a heparin drip and this was being transitioned to p.o. apixaban   Author: Raiford Noble, DO [facility] 11/22/2021 4:58 PM  For on call review [email or web address].

## 2021-11-22 NOTE — Evaluation (Signed)
Physical Therapy Evaluation Patient Details Name: Meagan Wallace MRN: [id number] DOB: 1946-10-05 Today's Date: 11/22/2021  History of Present Illness  The pt is a 76 yo female presenting 2/7 with AMS and diarrhea x2 days. Upon work up, pt with new onset afib with RVR. PMH includes: DM II, HTN, anemia, depression, anxiety, and breast cancer.   Clinical Impression  Pt in bed upon arrival of PT, agreeable to evaluation at this time. Prior to admission the pt was mobilizing short distances with use of rollator in the home, living alone in a home with 6 steps to enter, and getting HHPT as well as intermittent assist with IADLs from family. The pt now presents with limitations in functional mobility, strength, power, endurance, and dynamic stability due to above dx, and will continue to benefit from skilled PT to address these deficits. The pt required minA to complete bed mobility and initial sit-stand, she was able to complete short bout of ambulation in the room but after ~5 ft and static standing for 1 min the pt's HR was elevated to 145bpm and required seated rest to recover due to fatigue. Will continue to benefit from skilled PT acutely to progress endurance, strength, and stability. After discussion with the pt's family, they are unable to provide level of assist needed, will need to pursue SNF placement for continued rehab as pt not safe to return home alone at this time.         Recommendations for follow up therapy are one component of a multi-disciplinary discharge planning process, led by the attending physician.  Recommendations may be updated based on patient status, additional functional criteria and insurance authorization.  Follow Up Recommendations Skilled nursing-short term rehab (<3 hours/day)    Assistance Recommended at Discharge Frequent or constant Supervision/Assistance  Patient can return home with the following  A lot of help with walking and/or transfers;A little help  with bathing/dressing/bathroom;Assistance with cooking/housework;Direct supervision/assist for medications management;Direct supervision/assist for financial management;Assist for transportation;Help with stairs or ramp for entrance    Equipment Recommendations None recommended by PT  Recommendations for Other Services       Functional Status Assessment Patient has had a recent decline in their functional status and demonstrates the ability to make significant improvements in function in a reasonable and predictable amount of time.     Precautions / Restrictions Precautions Precautions: Fall Restrictions Weight Bearing Restrictions: No      Mobility  Bed Mobility Overal bed mobility: Needs Assistance Bed Mobility: Supine to Sit     Supine to sit: Min assist     General bed mobility comments: pt able to move LE to EOB but assist to pull up to sitting    Transfers Overall transfer level: Needs assistance Equipment used: Rolling walker (2 wheels) Transfers: Sit to/from Stand Sit to Stand: Min assist, +2 safety/equipment           General transfer comment: Min A for power up and then weight shift forward    Ambulation/Gait Ambulation/Gait assistance: Min assist, +2 safety/equipment (chair follow) Gait Distance (Feet): 5 Feet Assistive device: Rolling walker (2 wheels) Gait Pattern/deviations: Step-to pattern, Decreased stride length, Shuffle, Trunk flexed Gait velocity: decreased Gait velocity interpretation: <1.31 ft/sec, indicative of household ambulator   General Gait Details: trunk flexed with RW outside BOS despite max cues. minA to steady and chair follow due to poor endurance. minimal step clearance wtih increased effort         Balance Overall balance assessment: Needs assistance  Sitting-balance support: No upper extremity supported, Feet supported Sitting balance-Leahy Scale: Fair     Standing balance support: No upper extremity supported, During  functional activity Standing balance-Leahy Scale: Fair Standing balance comment: statis standing at sink                             Pertinent Vitals/Pain Pain Assessment Pain Assessment: Faces Faces Pain Scale: Hurts little more Pain Location: Generalized Pain Descriptors / Indicators: Constant, Discomfort Pain Intervention(s): Limited activity within patient's tolerance, Monitored during session, Repositioned    Home Living Family/patient expects to be discharged to:: Private residence Living Arrangements: Alone Available Help at Discharge: Family;Available PRN/intermittently Type of Home: House Home Access: Stairs to enter Entrance Stairs-Rails: Left Entrance Stairs-Number of Steps: 6     Home Equipment: Air cabin crew (4 wheels);Rolling Walker (2 wheels)      Prior Function Prior Level of Function : Needs assist;History of Falls (last six months)       Physical Assist : Mobility (physical);ADLs (physical) Mobility (physical): Stairs;Gait ADLs (physical): IADLs Mobility Comments: pt reports use of rollator to "carry things" while moving in the home ADLs Comments: pt reports family comes to assist with meals     Hand Dominance   Dominant Hand: Right    Extremity/Trunk Assessment   Upper Extremity Assessment Upper Extremity Assessment: Generalized weakness    Lower Extremity Assessment Lower Extremity Assessment: Generalized weakness (pt able to complete against gravity, poor muscular endurance and power)    Cervical / Trunk Assessment Cervical / Trunk Assessment: Kyphotic  Communication   Communication: No difficulties;HOH  Cognition Arousal/Alertness: Awake/alert Behavior During Therapy: WFL for tasks assessed/performed Overall Cognitive Status: Impaired/Different from baseline Area of Impairment: Attention, Orientation, Memory, Following commands, Safety/judgement, Awareness, Problem solving                 Orientation Level:  Disoriented to, Time (Able to state Feb. Not knowing the year. Pt stating "I am having problems with these questions lately") Current Attention Level: Sustained, Selective Memory: Decreased short-term memory Following Commands: Follows one step commands inconsistently, Follows one step commands with increased time Safety/Judgement: Decreased awareness of safety, Decreased awareness of deficits Awareness: Intellectual Problem Solving: Slow processing, Requires verbal cues General Comments: Pt continues to present with decreased orienation (not recalling year). Needing increased cues and benefiting from calm environment with simple cues.        General Comments General comments (skin integrity, edema, etc.): HR elevating to 145 (max) with peri care at sink. HR majority in 120-130s with gait. At rest, HR 90    Exercises     Assessment/Plan    PT Assessment Patient needs continued PT services  PT Problem List Decreased strength;Decreased activity tolerance;Decreased range of motion;Decreased balance;Decreased mobility;Decreased coordination;Decreased cognition;Decreased safety awareness       PT Treatment Interventions DME instruction;Gait training;Functional mobility training;Therapeutic activities;Balance training;Therapeutic exercise;Patient/family education    PT Goals (Current goals can be found in the Care Plan section)  Acute Rehab PT Goals Patient Stated Goal: get stronger then return home PT Goal Formulation: With patient Time For Goal Achievement: 12/06/21 Potential to Achieve Goals: Good    Frequency Min 2X/week     Co-evaluation PT/OT/SLP Co-Evaluation/Treatment: Yes Reason for Co-Treatment: Necessary to address cognition/behavior during functional activity;For patient/therapist safety;To address functional/ADL transfers PT goals addressed during session: Mobility/safety with mobility;Balance;Strengthening/ROM;Proper use of DME         AM-PAC PT "6 Clicks"  Mobility  Outcome Measure Help needed turning from your back to your side while in a flat bed without using bedrails?: A Little Help needed moving from lying on your back to sitting on the side of a flat bed without using bedrails?: A Little Help needed moving to and from a bed to a chair (including a wheelchair)?: A Lot Help needed standing up from a chair using your arms (e.g., wheelchair or bedside chair)?: A Lot Help needed to walk in hospital room?: Total Help needed climbing 3-5 steps with a railing? : Total 6 Click Score: 12    End of Session Equipment Utilized During Treatment: Gait belt Activity Tolerance: Patient limited by fatigue Patient left: in chair;with call bell/phone within reach;with chair alarm set Nurse Communication: Mobility status PT Visit Diagnosis: Unsteadiness on feet (R26.81);Other abnormalities of gait and mobility (R26.89);Muscle weakness (generalized) (M62.81)    Time: 8295-6213 PT Time Calculation (min) (ACUTE ONLY): 30 min   Charges:   PT Evaluation $PT Eval Moderate Complexity: 1 Mod          [place], PT, DPT   Acute Rehabilitation Department Pager #: [phone]  Sandra Cockayne 11/22/2021, 5:18 PM

## 2021-11-22 NOTE — Assessment & Plan Note (Addendum)
-  Patient's K+ was 3.2 -Mag Level was 1.8 -Replete with p.o. KCl 40 mg twice daily x2 doses and with IV Mag Sulfate 2 grams prior to D/C -Continue monitoring appears necessary -Repeat CMP within 1 week

## 2021-11-22 NOTE — TOC Benefit Eligibility Note (Signed)
Patient Teacher, English as a foreign language completed.    The patient is currently admitted and upon discharge could be taking Eliquis 5 mg.  The current 30 day co-pay is, $552.00 due to a $505.00 deductible.   The patient is currently admitted and upon discharge could be taking Xarelto 20 mg.  The current 30 day co-pay is, $552.00 due to a $505.00 deductible.   The patient is insured through [place], [place] Patient Advocate Specialist [place] Patient Advocate Team Direct Number: [phone]  Fax: [phone]

## 2021-11-23 ENCOUNTER — Other Ambulatory Visit (HOSPITAL_COMMUNITY): Payer: Self-pay

## 2021-11-23 DIAGNOSIS — R7989 Other specified abnormal findings of blood chemistry: Secondary | ICD-10-CM

## 2021-11-23 DIAGNOSIS — I4891 Unspecified atrial fibrillation: Secondary | ICD-10-CM | POA: Diagnosis not present

## 2021-11-23 LAB — CBC WITH DIFFERENTIAL/PLATELET
Abs Immature Granulocytes: 0.25 10*3/uL — ABNORMAL HIGH (ref 0.00–0.07)
Basophils Absolute: 0.1 10*3/uL (ref 0.0–0.1)
Basophils Relative: 1 %
Eosinophils Absolute: 0.1 10*3/uL (ref 0.0–0.5)
Eosinophils Relative: 1 %
HCT: 32.2 % — ABNORMAL LOW (ref 36.0–46.0)
Hemoglobin: 10.2 g/dL — ABNORMAL LOW (ref 12.0–15.0)
Immature Granulocytes: 3 %
Lymphocytes Relative: 13 %
Lymphs Abs: 1.1 10*3/uL (ref 0.7–4.0)
MCH: 25.4 pg — ABNORMAL LOW (ref 26.0–34.0)
MCHC: 31.7 g/dL (ref 30.0–36.0)
MCV: 80.3 fL (ref 80.0–100.0)
Monocytes Absolute: 1.1 10*3/uL — ABNORMAL HIGH (ref 0.1–1.0)
Monocytes Relative: 12 %
Neutro Abs: 6.4 10*3/uL (ref 1.7–7.7)
Neutrophils Relative %: 70 %
Platelets: 142 10*3/uL — ABNORMAL LOW (ref 150–400)
RBC: 4.01 MIL/uL (ref 3.87–5.11)
RDW: 15.6 % — ABNORMAL HIGH (ref 11.5–15.5)
WBC: 8.9 10*3/uL (ref 4.0–10.5)
nRBC: 0 % (ref 0.0–0.2)

## 2021-11-23 LAB — URINE CULTURE: Culture: 40000 — AB

## 2021-11-23 LAB — RESP PANEL BY RT-PCR (FLU A&B, COVID) ARPGX2
Influenza A by PCR: NEGATIVE
Influenza B by PCR: NEGATIVE
SARS Coronavirus 2 by RT PCR: NEGATIVE

## 2021-11-23 LAB — COMPREHENSIVE METABOLIC PANEL
ALT: 14 U/L (ref 0–44)
AST: 13 U/L — ABNORMAL LOW (ref 15–41)
Albumin: 2.1 g/dL — ABNORMAL LOW (ref 3.5–5.0)
Alkaline Phosphatase: 110 U/L (ref 38–126)
Anion gap: 11 (ref 5–15)
BUN: 16 mg/dL (ref 8–23)
CO2: 25 mmol/L (ref 22–32)
Calcium: 8.9 mg/dL (ref 8.9–10.3)
Chloride: 104 mmol/L (ref 98–111)
Creatinine, Ser: 1.18 mg/dL — ABNORMAL HIGH (ref 0.44–1.00)
GFR, Estimated: 48 mL/min — ABNORMAL LOW (ref >60)
Glucose, Bld: 135 mg/dL — ABNORMAL HIGH (ref 70–99)
Potassium: 3.2 mmol/L — ABNORMAL LOW (ref 3.5–5.1)
Sodium: 140 mmol/L (ref 135–145)
Total Bilirubin: 0.4 mg/dL (ref 0.3–1.2)
Total Protein: 5.8 g/dL — ABNORMAL LOW (ref 6.5–8.1)

## 2021-11-23 LAB — GLUCOSE, CAPILLARY
Glucose-Capillary: 149 mg/dL — ABNORMAL HIGH (ref 70–99)
Glucose-Capillary: 99 mg/dL (ref 70–99)
Glucose-Capillary: 128 mg/dL — ABNORMAL HIGH (ref 70–99)

## 2021-11-23 LAB — HEPARIN LEVEL (UNFRACTIONATED): Heparin Unfractionated: 0.36 IU/mL (ref 0.30–0.70)

## 2021-11-23 LAB — PHOSPHORUS: Phosphorus: 3.2 mg/dL (ref 2.5–4.6)

## 2021-11-23 LAB — MAGNESIUM: Magnesium: 1.8 mg/dL (ref 1.7–2.4)

## 2021-11-23 MED ORDER — POTASSIUM CHLORIDE CRYS ER 20 MEQ PO TBCR
40.0000 meq | EXTENDED_RELEASE_TABLET | Freq: Two times a day (BID) | ORAL | Status: DC
  Administered 2021-11-23: 40 meq via ORAL
  Filled 2021-11-23: qty 2

## 2021-11-23 MED ORDER — APIXABAN 5 MG PO TABS: 5.0000 mg | ORAL_TABLET | Freq: Two times a day (BID) | ORAL | 0 refills | Status: AC

## 2021-11-23 MED ORDER — DILTIAZEM HCL ER COATED BEADS 120 MG PO CP24: 120.0000 mg | ORAL_CAPSULE | Freq: Every day | ORAL | 0 refills | Status: AC

## 2021-11-23 MED ORDER — CEPHALEXIN 500 MG PO CAPS
500.0000 mg | ORAL_CAPSULE | Freq: Four times a day (QID) | ORAL | 0 refills | Status: DC
  Filled 2021-11-23: qty 16, 4d supply, fill #0

## 2021-11-23 MED ORDER — MAGNESIUM SULFATE 2 GM/50ML IV SOLN
2.0000 g | Freq: Once | INTRAVENOUS | Status: AC
  Administered 2021-11-23: 2 g via INTRAVENOUS
  Filled 2021-11-23: qty 50

## 2021-11-23 MED ORDER — CEPHALEXIN 500 MG PO CAPS
500.0000 mg | ORAL_CAPSULE | Freq: Three times a day (TID) | ORAL | 0 refills | Status: AC
  Filled 2021-11-23: qty 12, 4d supply, fill #0

## 2021-11-23 MED ORDER — DILTIAZEM HCL ER COATED BEADS 120 MG PO CP24
120.0000 mg | ORAL_CAPSULE | Freq: Every day | ORAL | Status: DC
  Administered 2021-11-23: 120 mg via ORAL
  Filled 2021-11-23: qty 1

## 2021-11-23 MED ORDER — APIXABAN 5 MG PO TABS
5.0000 mg | ORAL_TABLET | Freq: Two times a day (BID) | ORAL | Status: DC
  Administered 2021-11-23: 5 mg via ORAL
  Filled 2021-11-23: qty 1

## 2021-11-23 NOTE — [place] FL2 (Signed)
[place] MEDICAID FL2 LEVEL OF CARE SCREENING TOOL     IDENTIFICATION  Patient Name: Meagan Wallace Birthdate: Oct 20, 1945 Sex: female Admission Date (Current Location): 11/20/2021  [facility] and [place] Number:  Herbalist and Address:  [facility] [place], [place] [address] [place], [place] [address]      Provider Number: [id number]  Attending Physician Name and Address:  Kerney Elbe, DO  Relative Name and Phone Number:  Mechele Claude [phone]    Current Level of Care: Hospital Recommended Level of Care: [place] Prior Approval Number:    Date Approved/Denied:   PASRR Number: [id number]  Discharge Plan: SNF    Current Diagnoses: Patient Active Problem List   Diagnosis Date Noted   Hypokalemia 11/22/2021   Thrombocytopenia ([place]) 11/22/2021   Pressure injury of skin 11/21/2021   Hyperammonemia ([place]) 11/21/2021   Leukocytosis 11/21/2021   Microcytic anemia 11/21/2021   Hyponatremia 11/21/2021   Acute encephalopathy 11/21/2021   Cirrhosis of liver ([place]) 11/20/2021   Atrial fibrillation with rapid ventricular response ([place]) 11/20/2021   Acute cystitis 11/20/2021   AKI (acute kidney injury) ([place]) 11/20/2021   Acute diarrhea 11/20/2021   Fibroadenoma of right breast 08/02/2016   High risk medication use 01/23/2016   Chronic depression 10/06/2014   Menopausal syndrome (hot flashes) 10/06/2014   Osteopenia determined by x-ray 10/06/2014   History of breast cancer 04/22/2014   Type 2 diabetes mellitus without complications ([place]) 54/27/0623   Obesity, Class III, BMI 40-49.9 (morbid obesity) ([place]) 03/19/2014   Osteopenia 03/19/2014   CVA (cerebral infarction) 03/19/2014   HTN (hypertension) 03/19/2014   DCIS (ductal carcinoma in situ) 12/01/2013   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    Orientation RESPIRATION BLADDER Height & Weight     Self, Situation, Place  Normal Continent Weight: 222 lb  0.1 oz (100.7 kg) Height:  5\' 5"  (165.1 cm)  BEHAVIORAL SYMPTOMS/MOOD NEUROLOGICAL BOWEL NUTRITION STATUS      Continent (WDL) Diet (Please see discharge summary)  AMBULATORY STATUS COMMUNICATION OF NEEDS Skin   Limited Assist Verbally Other (Comment) (Abrasion excoriated stratch marks,buttocks,sacrum,lower,foam,PI buttocks left stage 2,foam lift dressing,clean,dry,intact,PI coccyx mid stage 1 foam lift dressing,clean,dry,intact)                       Personal Care Assistance Level of Assistance  Bathing, Feeding, Dressing Bathing Assistance: Limited assistance Feeding assistance: Independent (able to feed self) Dressing Assistance: Limited assistance     Functional Limitations Info  Sight, Hearing, Speech Sight Info: Impaired Hearing Info: Adequate Speech Info: Adequate    SPECIAL CARE FACTORS FREQUENCY  PT (By licensed PT), OT (By licensed OT)     PT Frequency: 5x min weekly OT Frequency: 5x min weekly            Contractures Contractures Info: Not present    Additional Factors Info  Code Status, Allergies, Insulin Sliding Scale Code Status Info: FULL Allergies Info: No Known Allergies   Insulin Sliding Scale Info: insulin aspart (novoLOG) injection 0-5 Units daily at bedtime,insulin aspart (novoLOG) injection 0-9 Units 3 times daily with meals       Current Medications (11/23/2021):  This is the current hospital active medication list Current Facility-Administered Medications  Medication Dose Route Frequency Provider Last Rate Last Admin   acetaminophen (TYLENOL) tablet 650 mg  650 mg Oral Q4H PRN Kristopher Oppenheim, DO   650 mg at 11/23/21 0229   apixaban (ELIQUIS) tablet  5 mg  5 mg Oral BID Lelon Perla, MD   5 mg at 11/23/21 1006   cefTRIAXone (ROCEPHIN) 2 g in sodium chloride 0.9 % 100 mL IVPB  2 g Intravenous Q24H Kristopher Oppenheim, DO 200 mL/hr at 11/22/21 1755 2 g at 11/22/21 1755   diltiazem (CARDIZEM CD) 24 hr capsule 120 mg  120 mg Oral Daily Lelon Perla,  MD   120 mg at 11/23/21 1007   insulin aspart (novoLOG) injection 0-5 Units  0-5 Units Subcutaneous QHS Sheikh, Omair Latif, DO       insulin aspart (novoLOG) injection 0-9 Units  0-9 Units Subcutaneous TID WC Raiford Noble [place], DO   1 Units at 11/23/21 3762   lactated ringers infusion   Intravenous Continuous Raiford Noble [place], DO 75 mL/hr at 11/23/21 8315 Infusion Verify at 11/23/21 0735   magnesium sulfate IVPB 2 g 50 mL  2 g Intravenous Once Raiford Noble Latif, DO 50 mL/hr at 11/23/21 1011 2 g at 11/23/21 1011   ondansetron (ZOFRAN) injection 4 mg  4 mg Intravenous Q6H PRN Kristopher Oppenheim, DO       potassium chloride SA (KLOR-CON M) CR tablet 40 mEq  40 mEq Oral BID Raiford Noble Latif, DO   40 mEq at 11/23/21 1006     Discharge Medications: Please see discharge summary for a list of discharge medications.  Relevant Imaging Results:  Relevant Lab Results:   Additional Information [phone], Both Covid Vaccines 1 booster  Milas Gain, LCSWA

## 2021-11-23 NOTE — TOC Transition Note (Signed)
Transition of Care [facility]) - CM/SW Discharge Note   Patient Details  Name: Meagan Wallace MRN: [id number] Date of Birth: 1946/01/29  Transition of [facility]) CM/SW Contact:  Milas Gain, [place] Phone Number: 11/23/2021, 1:24 PM   Clinical Narrative:     Patient will DC to: [facility]   Anticipated DC date: 11/23/2021  Family notified: Mechele Claude  Transport by: Corey Harold  ?  Per MD patient ready for DC to [facility] . RN, patient, patient's family, and facility notified of DC. Discharge Summary sent to facility. RN given number for report tele# [phone]. DC packet on chart. Ambulance transport requested for patient.  CSW signing off.   Final next level of care: Skilled Nursing Facility Barriers to Discharge: No Barriers Identified   Patient Goals and CMS Choice Patient states their goals for this hospitalization and ongoing recovery are:: SNF CMS Medicare.gov Compare Post Acute Care list provided to:: Patient Choice offered to / list presented to : Patient  Discharge Placement              Patient chooses bed at: [facility] Patient to be transferred to facility by: [facility] Name of family member notified: JoAnne Patient and family notified of of transfer: 11/23/21  Discharge Plan and Services In-house Referral: Clinical Social Work                                   Social Determinants of Health ([place]) Interventions     Readmission Risk Interventions No flowsheet data found.

## 2021-11-23 NOTE — Progress Notes (Signed)
Mobility Specialist Progress Note    11/23/21 1321  Mobility  Bed Position Chair  Activity Ambulated with assistance in hallway  Level of Assistance Contact guard assist, steadying assist  Assistive Device Front wheel walker  Distance Ambulated (ft) 96 ft  Activity Response Tolerated fair  $Mobility charge 1 Mobility   Pt received in chair in front of sink. No complaints on walk, did not want to 'go far'. Returned to chair with call bell in reach.   [facility] Mobility Specialist  M.S. 5N: [phone]

## 2021-11-23 NOTE — Progress Notes (Signed)
Report called to Samuel Bouche RN at [facility].  Patient's brother, Louie Casa notified of patient's transfer once [facility] arrives.

## 2021-11-23 NOTE — Discharge Summary (Signed)
Physician Discharge Summary   Patient: Meagan Wallace MRN: [id number] DOB: Apr 11, 1946  Admit date:     11/20/2021  Discharge date: 11/23/21  Discharge Physician: Kerney Elbe   PCP: Charlynn Court, NP   Recommendations at discharge:  Follow-up with PCP within 1 to 2 weeks and have PCP repeat thyroid function test in 4 to 6 weeks Repeat CBC, CMP, mag, Phos within 1 week Follow-up with cardiology within 4 weeks and have discussion about outpatient cardioversion versus continued rate control Follow-up with gastroenterology Dr. Alessandra Bevels within 1 to 2 weeks  Discharge Diagnoses: Principal Problem:   Atrial fibrillation with rapid ventricular response ([place]) Active Problems:   Ductal carcinoma in situ (DCIS) of right breast   Type 2 diabetes mellitus without complications (HCC)   Obesity, Class III, BMI 40-49.9 (morbid obesity) ([place])   HTN (hypertension)   Cirrhosis of liver (HCC)   Acute cystitis   AKI (acute kidney injury) ([place])   Acute diarrhea   Pressure injury of skin   Hyperammonemia (HCC)   Leukocytosis   Microcytic anemia   Hyponatremia   Acute encephalopathy   Hypokalemia   Thrombocytopenia (HCC)   Elevated serum free T4 level  Resolved Problems:   * No resolved hospital problems. *   Hospital Course: Patient is a 76 year old morbidly obese Caucasian female with a past medical history significant for but not limited to diabetes mellitus type 2, hypertension, hyperlipidemia, history of cirrhosis, history of breast cancer, history of depression and anxiety, history of CVA as well as other comorbidities resented the ED with a 2-day history of diarrhea.  Patient lives by herself and the patient's family was concerned that patient may have had a UTI.  No family is at bedside initially but she came to the ED for her diarrhea and was found to be in new onset A-fib with RVR.  Blood pressures on the lower side and a code sepsis was called.  She was given IV fluid  resuscitation and I suspected that she had a UTI so started on antibiotics.  She was found to have an AKI but now her AKI is improving with fluid hydration.  Due to her AKI, rapid atrial fibrillation with acute diarrhea tried hospitalist team was consulted for admission.  She was given fluid boluses and started on IV Rocephin which we will continue.  She was admitted to the progressive bed in the ED and cardiology was consulted and patient was placed on p.o. Cardizem.  She was initiated on heparin drip and then transition to p.o. apixaban.  We will get PT and OT to evaluate and treat and per cardiology we will transition to p.o. on 120 mg of Cardizem in the morning.  She may need to be transition to something else given that the apixaban may be too prohibitively expensive for the patient but will have TOC assist the [facility] team recommends that the patient's family go through the company for assistance.  She is medically stable to be discharged to SNF at this time cardiology is cleared.  Prior to discharge her potassium was repleted along with her magnesium.  Assessment and Plan: * Atrial fibrillation with rapid ventricular response ([place])- (present on admission) -Admitted to progressive bed and she was started on IV lopressor ordered in ER and given 5 mg x 1.  -HR 100-110s. If sustained HR >100, would consider IV cardizem gtts at 5 mg/hr without bolus.  -Checked TSH and was 2.014 and her free T4 was elevated at 1.59 -  Mag level was 1.7 so will replete with IV mag sulfate 2 g again -Echocardiogram has been ordered and showed left ventricular ejection fraction 55 to 60% with no regional wall motion abnormalities and mild LVH with the left ventricular diastolic primary being indeterminate and right ventricular systolic function being normal -Cardiology started the patient on Cardizem 30 mg p.o. every 6 scheduled and if she tolerates this we will transition her to 120 mg p.o. daily tomorrow -Was anticoagulated with  a heparin drip and transition to p.o. apixaban -CHA2DS2-VASc score was at least 7 -Continue with heparin drip for now and change to p.o. anticoagulation and will be transition to p.o. apixaban but cardiology may need to transition her to something else -Cardiology can consider cardioversion to see if she would hold sinus rhythm in the outpatient setting after she is therapeutically anticoagulated for greater than 21 days but if she does not hold a sinus rhythm then rate control and anticoagulation may be the best option as she is asymptomatic -Appreciate cardiology evaluation and management -Continue to monitor on telemetry while hospitalized and patient is to be discharged and follow-up with cardiology within 4 weeks and they are recommending discussing at that time whether to proceed with cardioversion or to continue with rate control at that time and Dr. Stanford Breed favors 1 attempted cardioversion if she does not hold sinus rhythm continue rate control afterwards as she is asymptomatic     Elevated serum free T4 level -Patient's T4 was elevated at 1.59 and TSH was 2.014 -Repeat TFTs in 4-6 weeks and also get T3 as an outpatient   Thrombocytopenia ([place]) -Patient's platelet count dropped and went from 175 -> 176 -> 136 -> 142 -Monitor for signs and symptoms bleeding as patient is now on anticoagulation; no overt bleeding noted -Repeat CBC within 1 week  Hypokalemia -Patient's K+ was 3.2 -Mag Level was 1.8 -Replete with p.o. KCl 40 mg twice daily x2 doses and with IV Mag Sulfate 2 grams prior to D/C -Continue monitoring appears necessary -Repeat CMP within 1 week  Acute encephalopathy -Unclear if this is related to hepatic encephalopathy versus infection in the setting of her diarrhea and possible cystitis; she is much improved and at baseline -CT of the head done which showed mild chronic ischemic white matter changes which is unchanged from prior no acute intracranial processes -Ammonia  level was elevated at 52 and is improved to 40 -Continue to treat and evaluate for her diarrhea she has had no more diarrheal episodes so we have canceled her GI pathogen panel -If not improving will consider further work-up but will hold off -She is appropriate and at baseline -Continue to monitor and trend closely  Hyponatremia -Patient's Na+ went from 133 -> 134 -> 136 -> 140 -Continued with IV fluid hydration with lactated Ringer's at 75 MLS but now discontinued  -Repeat CMP within 1 week   Microcytic anemia - Patient's hemoglobin/hematocrit went from 12.6/37.0 -> 10.5/33.1 -> 10.0/30.7 -> 10.2/32.2 -Checked anemia panel showed an iron level of 26, U IBC 195, TIBC of 221, saturation ratios of 12%, ferritin of 98, folate level 11.2, vitamin B12 at 336 -Discuss with PCP about iron supplementation as an outpatient  -Continue to monitor for signs and symptoms of bleeding; currently no overt bleeding noted -Repeat CBC within 1 week  Leukocytosis -WBC went from 16.5 -> 12.7 and is now resolved and trended down to 7.7 yesterday and today was 8.9 -Likely reactive and she was likely hemoconcentrated on admission in the  setting of dehydration from her diarrhea -Continue monitor for signs and symptoms and she is already on empiric antibiotics with IV ceftriaxone for suspected cystitis; urine culture growing out 40,000 colony units of E. Coli which was pan-sensitive and will change to p.o. Keflex at discharge -Repeat CBC in the a.m.  Hyperammonemia (HCC) -Ammonia level was 52 on admission and improved to 43 -We will hold off giving lactulose given her significant amount of diarrhea however diarrhea has improved now -Follow up with Gastroenterology within 1-2 weeks  Pressure injury of skin -poA -Consult WOC nurse for further evaluation and recommendations and follow at SNF  Acute diarrhea -Checking GI viral  Panel but will discontinue given that she has had no further bowel movement.   Enteric precautions also discontinued. -WBC improving and went from 16.5 -> 12.7 -> 7.7 yesterday but she is likely hemoconcentrated on admission from dehydration and is now 8.9 -GI pathogen panel canceled as above and will need to monitor for her diarrhea  AKI (acute kidney injury) ([place]) -Received a lactated Ringer's bolus 1 time in the ED and was not started on any maintenance IV fluids so we will start her on 75 mL and this was now finished and stopped -AKI likely caused by combination of volume loss from diarrhea and continued ACE inhibitor use. -Patient's BUNs/creatinine went from 62/1.70 -> 44/1.34 -> 20/1.04 and today it was a little elevate at 16/1.18 -Avoid further nephrotoxic medications, contrast dyes, hypotension and renally dose medications -Repeat CMP in the a.m.  Acute cystitis - Patient had a urinalysis done which shows a cloudy appearance with large hemoglobin, large leukocytes, negative nitrites, many bacteria, 6-10 no squamous epithelial cells, 11-20 RBCs per high-power field, 6-10 squamous epithelial cells, greater than 50 WBCs with urine and blood culture still pending -Urine culture grew out 40,000 colony-forming units of E. coli with sensitivities pending and blood cultures x2 showed no growth to date -Continue with IV ceftriaxone for now and change to po Keflex at D/C  Cirrhosis of liver ([place])- (present on admission) -Chronic. -LFTs and Bilirubin are normal at this time -Continue monitor for decompensation and repeat CMP in a.m. -Check PT and INR in the morning; patient's PT/INR is 13.1/1.0  HTN (hypertension)- (present on admission) -Currently wasn BP meds in face of acute kidney injury and hypotension on admission -Continue to monitor blood pressures per protocol -She was given IV metoprolol tartrate 5 mg once the day before yesterday; cardiology starting the patient on Cardizem 30 mg p.o. every 6 scheduled and transitioning to 120 mg p.o. daily today -Last blood  pressure reading was improved and is now 146/81 -Resume home Antihypertensives at D/C  Obesity, Class III, BMI 40-49.9 (morbid obesity) ([place])- (present on admission) -Complicates overall prognosis and care -Estimated body mass index is 41.68 kg/m as calculated from the following:   Height as of this encounter: 5\' 5"  (1.651 m).   Weight as of this encounter: 113.6 kg.  -Weight Loss and Dietary Counseling given   Type 2 diabetes mellitus without complications (HCC) -Hold oral medications as well as holding Ozempic but can resume at discharge -Initiated on sensitive NovoLog sign scale insulin before meals and at bedtime and adjust insulin as necessary -Checked hemoglobin A1c and was 5.5  -Continue monitor CBGs per protocol; CBGs ranging from 118-154 -Follow up with PCP for further management and evaluation  Ductal carcinoma in situ (DCIS) of right breast- (present on admission) -CTA PE done and showed Circumscribed fat and calcification in the right breast  likely representing fat necrosis. Possibly an old biopsy cavity or old Hematoma." -No longer taking Tamoxifen   Active Pressure Injury/Wound(s)     Pressure Ulcer  Duration          Pressure Injury Buttocks Left Stage 2 -  Partial thickness loss of dermis presenting as a shallow open injury with a red, pink wound bed without slough. -- days   Pressure Injury 11/21/21 Coccyx Mid Stage 1 -  Intact skin with non-blanchable redness of a localized area usually over a bony prominence. redness 1 day            Pain control - [place] Controlled Substance Reporting System database was reviewed. and patient was instructed, not to drive, operate heavy machinery, perform activities at heights, swimming or participation in water activities or provide baby-sitting services while on Pain, Sleep and Anxiety Medications; until their outpatient Physician has advised to do so again. Also recommended to not to take more than prescribed Pain,  Sleep and Anxiety Medications.   Consultants: Cardiology  Procedures performed: ECHOCARDIOGRAM  Disposition: Skilled nursing facility Diet recommendation:  Cardiac diet with 1500 mL Fluid Restriction and Low Salt Diet   DISCHARGE MEDICATION: Allergies as of 11/23/2021   No Known Allergies      Medication List     STOP taking these medications    amLODipine 10 MG tablet Commonly known as: NORVASC   aspirin 325 MG tablet   ibuprofen 200 MG tablet Commonly known as: ADVIL   multivitamin with minerals tablet   oxyCODONE-acetaminophen 5-325 MG tablet Commonly known as: PERCOCET/ROXICET   tamoxifen 20 MG tablet Commonly known as: NOLVADEX   VITAMIN B 12 PO   VITAMIN C PO   VITAMIN D3 PO       TAKE these medications    acetaminophen 500 MG tablet Commonly known as: TYLENOL Take 500 mg by mouth every 6 (six) hours as needed for moderate pain.   albuterol (2.5 MG/3ML) 0.083% nebulizer solution Commonly known as: PROVENTIL Take 2.5 mg by nebulization every 6 (six) hours as needed for wheezing or shortness of breath.   apixaban 5 MG Tabs tablet Commonly known as: ELIQUIS Take 1 tablet (5 mg total) by mouth 2 (two) times daily.   cephALEXin 500 MG capsule Commonly known as: KEFLEX Take 1 capsule (500 mg total) by mouth 3 (three) times daily for 4 days.   diltiazem 120 MG 24 hr capsule Commonly known as: CARDIZEM CD Take 1 capsule (120 mg total) by mouth daily. Start taking on: November 24, 2021   furosemide 20 MG tablet Commonly known as: LASIX Take 20 mg by mouth daily.   metFORMIN 500 MG tablet Commonly known as: GLUCOPHAGE Take 2 tablets (1,000 mg total) by mouth 2 (two) times daily with a meal.   nystatin powder Commonly known as: MYCOSTATIN/NYSTOP Apply 1 application topically as needed for other. Apply under both breasts for sweating/ moisture   Ozempic (0.25 or 0.5 MG/DOSE) 2 MG/1.5ML Sopn Generic drug: Semaglutide(0.25 or 0.5MG /DOS) Inject  0.5 mg into the skin every Thursday.   pravastatin 10 MG tablet Commonly known as: PRAVACHOL Take 10 mg by mouth at bedtime.   ramipril 10 MG capsule Commonly known as: ALTACE Take 10 mg by mouth daily.   venlafaxine XR 150 MG 24 hr capsule Commonly known as: EFFEXOR-XR Take 150 mg by mouth daily with breakfast.        Follow-up Information     Deberah Pelton, NP Follow up.  Specialty: Cardiology Why: Follow-up with Cardiology scheduled for 12/27/2021 at 3:10pm. Please arrive 15 minutes early for check-in. If this date/time does not work for you, please call our office to reschedule. Contact information: [address] [place] [address] [phone]         Charlynn Court, NP Follow up.   Specialty: Nurse Practitioner Why: Follow up within 1-2 weeks Contact information: [place] [address] [phone]         Lelon Perla, MD .   Specialty: Cardiology Contact information: [address] [place] [address] [phone]         Otis Brace, MD Follow up.   Specialty: Gastroenterology Why: Follow up within 1-2 weeks Contact information: [place] [address] [phone]                 Discharge Exam: Danley Danker Weights   11/20/21 1906 11/23/21 0500  Weight: 113.6 kg 100.7 kg   Vitals:   11/23/21 0734 11/23/21 1201  BP: 125/83 (!) 146/81  Pulse: 80 83  Resp: 20   Temp: 97.9 F (36.6 C) 97.6 F (36.4 C)  SpO2: 94% 98%   Examination: Physical Exam:  Constitutional: WN/WD morbidly obese Caucasian female currently no acute distress appears calm Respiratory: Diminished to auscultation bilaterally with coarse breath sounds, no wheezing, rales, rhonchi or crackles. Normal respiratory effort and patient is not tachypenic. No accessory muscle use.  Cardiovascular: Irregularly Irregular,Trace edema Abdomen: Soft, non-tender, Distended 2/2 body habitus.  Bowel sounds  positive.  GU: Deferred.  Condition at discharge: stable  The results of significant diagnostics from this hospitalization (including imaging, microbiology, ancillary and laboratory) are listed below for reference.   Imaging Studies: DG Chest 2 View  Result Date: 11/20/2021 CLINICAL DATA:  Altered mental status. EXAM: CHEST - 2 VIEW COMPARISON:  May 06, 2017. FINDINGS: Stable cardiomegaly. Both lungs are clear. The visualized skeletal structures are unremarkable. IMPRESSION: No active cardiopulmonary disease. Electronically Signed   By: Marijo Conception M.D.   On: 11/20/2021 16:47   CT HEAD WO CONTRAST  Result Date: 11/20/2021 CLINICAL DATA:  Mental status change. Unknown cause. Uncovertebral diarrhea for the past 2 days. EXAM: CT HEAD WITHOUT CONTRAST TECHNIQUE: Contiguous axial images were obtained from the base of the skull through the vertex without intravenous contrast. RADIATION DOSE REDUCTION: This exam was performed according to the departmental dose-optimization program which includes automated exposure control, adjustment of the mA and/or kV according to patient size and/or use of iterative reconstruction technique. COMPARISON:  CT brain 08/04/2021 FINDINGS: Brain: The ventricles are normal in size and configuration. The basilar cisterns are patent. No mass, mass effect, or midline shift. No acute intracranial hemorrhage is seen. No abnormal extra-axial fluid collection. Mild periventricular white matter hypodensities are similar to prior, likely chronic ischemic white matter changes. There are atherosclerotic intracranial calcifications. Preservation of the normal cortical gray-white interface without CT evidence of an acute major vascular territorial cortical based infarction. Vascular: No hyperdense vessel or unexpected calcification. Skull: Normal. Negative for fracture or focal lesion. Sinuses/Orbits: Status post bilateral orbital lens replacements. The visualized paranasal sinuses and  mastoid air cells are clear. Other: None. IMPRESSION:: IMPRESSION: 1. Mild chronic ischemic white matter changes, unchanged from prior. 2. No acute intracranial process. Electronically Signed   By: Yvonne Kendall M.D.   On: 11/20/2021 17:38   CT Angio Chest PE W and/or Wo Contrast  Result Date: 11/20/2021 CLINICAL DATA:  Pulmonary embolus suspected. Positive D-dimer. Altered mental status. EXAM: CT ANGIOGRAPHY CHEST WITH CONTRAST TECHNIQUE: Multidetector CT imaging of the chest was performed using the standard protocol during bolus administration of intravenous contrast. Multiplanar CT image reconstructions and MIPs were obtained to evaluate the vascular anatomy. RADIATION DOSE REDUCTION: This exam was performed according to the departmental dose-optimization program which includes automated exposure control, adjustment of the mA and/or kV according to patient size and/or use of iterative reconstruction technique. CONTRAST:  24mL OMNIPAQUE IOHEXOL 350 MG/ML SOLN COMPARISON:  Chest radiograph 11/20/2021 FINDINGS: Cardiovascular: There is good opacification of the central and segmental pulmonary arteries. No focal filling defects. No evidence of significant pulmonary embolus. Normal caliber thoracic aorta. Normal heart size. No pericardial effusions. Mediastinum/Nodes: Thyroid gland is unremarkable. No significant lymphadenopathy. Esophagus is decompressed. Lungs/Pleura: Areas of linear atelectasis or consolidation in both lung bases. Possibly pneumonia. Right lower lobe pulmonary nodule measuring 5 mm diameter. No pleural effusions. No pneumothorax. Upper Abdomen: Changes of hepatic cirrhosis with nodular liver contour. Enlarged lateral segment left and caudate lobes. Spleen appears also enlarged. Surgical absence of the gallbladder. Circumscribed fat and calcification in the right breast likely representing fat necrosis. Possibly an old biopsy cavity or old hematoma. Musculoskeletal: No chest wall abnormality. No  acute or significant osseous findings. Review of the MIP images confirms the above findings. IMPRESSION: 1. No evidence of significant pulmonary embolus. 2. Areas of atelectasis or consolidation in both lower lungs. 3. Hepatic cirrhosis with splenic enlargement. 4. Focal area of fat necrosis in the right breast. Electronically Signed   By: Lucienne Capers M.D.   On: 11/20/2021 23:07   ECHOCARDIOGRAM COMPLETE  Result Date: 11/21/2021    ECHOCARDIOGRAM REPORT   Patient Name:   Meagan Wallace Date of Exam: 11/21/2021 Medical Rec #:  [id number]           Height:       65.0 in Accession #:    [id number]          Weight:       250.4 lb Date of Birth:  1946-04-07          BSA:          2.177 m Patient Age:    41 years            BP:           104/71 mmHg Patient Gender: F                   HR:           76 bpm. Exam Location:  Inpatient Procedure: 2D Echo Indications:    Afib  History:        Patient has no prior history of Echocardiogram examinations.                 Arrythmias:Atrial Fibrillation; Risk Factors:Diabetes and                 Hypertension.  Sonographer:    Jefferey Pica Referring Phys: [place]  1. Left ventricular ejection fraction, by estimation, is 55 to 60%. The left ventricle has normal function. The left ventricle has no regional wall motion abnormalities. There is mild left ventricular hypertrophy. Left ventricular diastolic parameters are indeterminate.  2. Right ventricular systolic function is normal. The right ventricular size is normal. There is normal pulmonary artery systolic pressure.  3. The mitral valve is grossly normal. No evidence of mitral valve regurgitation.  4.  The aortic valve is grossly normal. Aortic valve regurgitation is not visualized.  5. The inferior vena cava is normal in size with greater than 50% respiratory variability, suggesting right atrial pressure of 3 mmHg. Comparison(s): No prior Echocardiogram. FINDINGS  Left Ventricle: Left ventricular  ejection fraction, by estimation, is 55 to 60%. The left ventricle has normal function. The left ventricle has no regional wall motion abnormalities. The left ventricular internal cavity size was normal in size. There is  mild left ventricular hypertrophy. Left ventricular diastolic parameters are indeterminate. Right Ventricle: The right ventricular size is normal. No increase in right ventricular wall thickness. Right ventricular systolic function is normal. There is normal pulmonary artery systolic pressure. The tricuspid regurgitant velocity is 1.01 m/s, and  with an assumed right atrial pressure of 3 mmHg, the estimated right ventricular systolic pressure is 7.1 mmHg. Left Atrium: Left atrial size was normal in size. Right Atrium: Right atrial size was normal in size. Pericardium: There is no evidence of pericardial effusion. Mitral Valve: The mitral valve is grossly normal. No evidence of mitral valve regurgitation. Tricuspid Valve: The tricuspid valve is grossly normal. Tricuspid valve regurgitation is not demonstrated. Aortic Valve: The aortic valve is grossly normal. Aortic valve regurgitation is not visualized. Aortic valve peak gradient measures 16.0 mmHg. Pulmonic Valve: Pulmonic valve regurgitation is not visualized. Aorta: The aortic root and ascending aorta are structurally normal, with no evidence of dilitation. Venous: The inferior vena cava is normal in size with greater than 50% respiratory variability, suggesting right atrial pressure of 3 mmHg. IAS/Shunts: No atrial level shunt detected by color flow Doppler.  LEFT VENTRICLE PLAX 2D LVIDd:         4.50 cm LVIDs:         3.20 cm LV PW:         1.10 cm LV IVS:        1.20 cm LVOT diam:     2.00 cm LV SV:         71 LV SV Index:   33 LVOT Area:     3.14 cm  RIGHT VENTRICLE            IVC RV Basal diam:  2.40 cm    IVC diam: 1.80 cm RV S prime:     9.00 cm/s TAPSE (M-mode): 1.3 cm LEFT ATRIUM             Index        RIGHT ATRIUM           Index LA  diam:        4.30 cm 1.98 cm/m   RA Area:     11.50 cm LA Vol (A2C):   57.5 ml 26.42 ml/m  RA Volume:   21.20 ml  9.74 ml/m LA Vol (A4C):   53.7 ml 24.67 ml/m LA Biplane Vol: 59.5 ml 27.34 ml/m  AORTIC VALVE                 PULMONIC VALVE AV Area (Vmax): 2.23 cm     PV Vmax:       0.91 m/s AV Vmax:        200.00 cm/s  PV Peak grad:  3.3 mmHg AV Peak Grad:   16.0 mmHg LVOT Vmax:      142.00 cm/s LVOT Vmean:     85.867 cm/s LVOT VTI:       0.226 m  AORTA Ao Root diam: 3.20 cm Ao Asc diam:  3.50 cm  MITRAL VALVE                TRICUSPID VALVE MV Area (PHT): 4.57 cm     TR Peak grad:   4.1 mmHg MV Decel Time: 166 msec     TR Vmax:        101.00 cm/s MV E velocity: 117.00 cm/s MV A velocity: 109.00 cm/s  SHUNTS MV E/A ratio:  1.07         Systemic VTI:  0.23 m                             Systemic Diam: 2.00 cm Phineas Inches Electronically signed by Phineas Inches Signature Date/Time: 11/21/2021/4:44:49 PM    Final     Microbiology: Results for orders placed or performed during the hospital encounter of 11/20/21  Resp Panel by RT-PCR (Flu A&B, Covid) Nasopharyngeal Swab     Status: None   Collection Time: 11/20/21  3:36 PM   Specimen: Nasopharyngeal Swab; Nasopharyngeal(NP) swabs in vial transport medium  Result Value Ref Range Status   SARS Coronavirus 2 by RT PCR NEGATIVE NEGATIVE Final    Comment: (NOTE) SARS-CoV-2 target nucleic acids are NOT DETECTED.  The SARS-CoV-2 RNA is generally detectable in upper respiratory specimens during the acute phase of infection. The lowest concentration of SARS-CoV-2 viral copies this assay can detect is 138 copies/mL. A negative result does not preclude SARS-Cov-2 infection and should not be used as the sole basis for treatment or other patient management decisions. A negative result may occur with  improper specimen collection/handling, submission of specimen other than nasopharyngeal swab, presence of viral mutation(s) within the areas targeted by this assay,  and inadequate number of viral copies(<138 copies/mL). A negative result must be combined with clinical observations, patient history, and epidemiological information. The expected result is Negative.  Fact Sheet for Patients:  EntrepreneurPulse.com.au  Fact Sheet for Healthcare Providers:  IncredibleEmployment.be  This test is no t yet approved or cleared by the [place] FDA and  has been authorized for detection and/or diagnosis of SARS-CoV-2 by FDA under an Emergency Use Authorization (EUA). This EUA will remain  in effect (meaning this test can be used) for the duration of the COVID-19 declaration under Section 564(b)(1) of the Act, 21 U.S.C.section 360bbb-3(b)(1), unless the authorization is terminated  or revoked sooner.       Influenza A by PCR NEGATIVE NEGATIVE Final   Influenza B by PCR NEGATIVE NEGATIVE Final    Comment: (NOTE) The Xpert Xpress SARS-CoV-2/FLU/RSV plus assay is intended as an aid in the diagnosis of influenza from Nasopharyngeal swab specimens and should not be used as a sole basis for treatment. Nasal washings and aspirates are unacceptable for Xpert Xpress SARS-CoV-2/FLU/RSV testing.  Fact Sheet for Patients: EntrepreneurPulse.com.au  Fact Sheet for Healthcare Providers: IncredibleEmployment.be  This test is not yet approved or cleared by the [place] FDA and has been authorized for detection and/or diagnosis of SARS-CoV-2 by FDA under an Emergency Use Authorization (EUA). This EUA will remain in effect (meaning this test can be used) for the duration of the COVID-19 declaration under Section 564(b)(1) of the Act, 21 U.S.C. section 360bbb-3(b)(1), unless the authorization is terminated or revoked.  Performed at [facility], [place] [address] [place], [place] [address]   Blood Cultures (routine x 2)     Status: None (Preliminary result)   Collection Time:  11/20/21  4:00 PM  Specimen: BLOOD LEFT HAND  Result Value Ref Range Status   Specimen Description BLOOD LEFT HAND  Final   Special Requests   Final    BOTTLES DRAWN AEROBIC AND ANAEROBIC Blood Culture results may not be optimal due to an inadequate volume of blood received in culture bottles   Culture   Final    NO GROWTH 3 DAYS Performed at [facility], [place] [address] [place], [place] [address]    Report Status PENDING  Incomplete  Blood Cultures (routine x 2)     Status: None (Preliminary result)   Collection Time: 11/20/21  4:05 PM   Specimen: BLOOD  Result Value Ref Range Status   Specimen Description BLOOD LEFT FOREARM  Final   Special Requests   Final    BOTTLES DRAWN AEROBIC AND ANAEROBIC Blood Culture results may not be optimal due to an inadequate volume of blood received in culture bottles   Culture   Final    NO GROWTH 3 DAYS Performed at [facility], [place] [address], [place], [place] [address]    Report Status PENDING  Incomplete  Urine Culture     Status: Abnormal   Collection Time: 11/20/21  4:19 PM   Specimen: Urine, Clean Catch  Result Value Ref Range Status   Specimen Description URINE, CLEAN CATCH  Final   Special Requests   Final    NONE Performed at [facility], [place] [address], [place], [place] [address]    Culture 40,000 COLONIES/mL ESCHERICHIA COLI (A)  Final   Report Status 11/23/2021 FINAL  Final   Organism ID, Bacteria ESCHERICHIA COLI (A)  Final      Susceptibility   Escherichia coli - MIC*    AMPICILLIN <=2 SENSITIVE Sensitive     CEFAZOLIN <=4 SENSITIVE Sensitive     CEFEPIME <=0.12 SENSITIVE Sensitive     CEFTRIAXONE <=0.25 SENSITIVE Sensitive     CIPROFLOXACIN <=0.25 SENSITIVE Sensitive     GENTAMICIN <=1 SENSITIVE Sensitive     IMIPENEM <=0.25 SENSITIVE Sensitive     NITROFURANTOIN <=16 SENSITIVE Sensitive     TRIMETH/SULFA <=20 SENSITIVE Sensitive     AMPICILLIN/SULBACTAM <=2 SENSITIVE Sensitive     PIP/TAZO <=4  SENSITIVE Sensitive     * 40,000 COLONIES/mL ESCHERICHIA COLI  Resp Panel by RT-PCR (Flu A&B, Covid) Nasopharyngeal Swab     Status: None   Collection Time: 11/23/21 12:05 PM   Specimen: Nasopharyngeal Swab; Nasopharyngeal(NP) swabs in vial transport medium  Result Value Ref Range Status   SARS Coronavirus 2 by RT PCR NEGATIVE NEGATIVE Final    Comment: (NOTE) SARS-CoV-2 target nucleic acids are NOT DETECTED.  The SARS-CoV-2 RNA is generally detectable in upper respiratory specimens during the acute phase of infection. The lowest concentration of SARS-CoV-2 viral copies this assay can detect is 138 copies/mL. A negative result does not preclude SARS-Cov-2 infection and should not be used as the sole basis for treatment or other patient management decisions. A negative result may occur with  improper specimen collection/handling, submission of specimen other than nasopharyngeal swab, presence of viral mutation(s) within the areas targeted by this assay, and inadequate number of viral copies(<138 copies/mL). A negative result must be combined with clinical observations, patient history, and epidemiological information. The expected result is Negative.  Fact Sheet for Patients:  EntrepreneurPulse.com.au  Fact Sheet for Healthcare Providers:  IncredibleEmployment.be  This test is no t yet approved or cleared by the [place] FDA and  has been  authorized for detection and/or diagnosis of SARS-CoV-2 by FDA under an Emergency Use Authorization (EUA). This EUA will remain  in effect (meaning this test can be used) for the duration of the COVID-19 declaration under Section 564(b)(1) of the Act, 21 U.S.C.section 360bbb-3(b)(1), unless the authorization is terminated  or revoked sooner.       Influenza A by PCR NEGATIVE NEGATIVE Final   Influenza B by PCR NEGATIVE NEGATIVE Final    Comment: (NOTE) The Xpert Xpress SARS-CoV-2/FLU/RSV plus assay is  intended as an aid in the diagnosis of influenza from Nasopharyngeal swab specimens and should not be used as a sole basis for treatment. Nasal washings and aspirates are unacceptable for Xpert Xpress SARS-CoV-2/FLU/RSV testing.  Fact Sheet for Patients: EntrepreneurPulse.com.au  Fact Sheet for Healthcare Providers: IncredibleEmployment.be  This test is not yet approved or cleared by the [place] FDA and has been authorized for detection and/or diagnosis of SARS-CoV-2 by FDA under an Emergency Use Authorization (EUA). This EUA will remain in effect (meaning this test can be used) for the duration of the COVID-19 declaration under Section 564(b)(1) of the Act, 21 U.S.C. section 360bbb-3(b)(1), unless the authorization is terminated or revoked.  Performed at [facility], [place] [address], [place], [place] [address]    Labs: CBC: Recent Labs  Lab 11/20/21 1536 11/20/21 1610 11/20/21 1611 11/21/21 0307 11/22/21 0959 11/23/21 0225  WBC 16.5*  --   --  12.7* 7.7 8.9  NEUTROABS 13.4*  --   --  10.2* 5.8 6.4  HGB 11.4* 12.6 12.6 10.5* 10.0* 10.2*  HCT 34.9* 37.0 37.0 33.1* 30.7* 32.2*  MCV 80.8  --   --  82.1 79.7* 80.3  PLT 175  --   --  176 136* 062*   Basic Metabolic Panel: Recent Labs  Lab 11/20/21 1536 11/20/21 1610 11/20/21 1611 11/20/21 2123 11/21/21 0307 11/22/21 0959 11/23/21 0225  NA 131* 133* 133*  --  134* 136 140  K 4.8 4.0 4.0  --  3.6 3.4* 3.2*  CL 98  --  100  --  99 103 104  CO2 22  --   --   --  23 24 25   GLUCOSE 93  --  98  --  163* 181* 135*  BUN 52*  --  62*  --  44* 20 16  CREATININE 1.67*  --  1.70*  --  1.37* 1.04* 1.18*  CALCIUM 9.2  --   --   --  8.9 8.5* 8.9  MG  --   --   --  1.8 1.7 1.7 1.8  PHOS  --   --   --   --   --  2.8 3.2   Liver Function Tests: Recent Labs  Lab 11/20/21 1536 11/21/21 0307 11/22/21 0959 11/23/21 0225  AST 24 11* 11* 13*  ALT 15 12 12 14   ALKPHOS 127* 103 112  110  BILITOT 2.5* 0.4 0.5 0.4  PROT 6.1* 5.5* 5.6* 5.8*  ALBUMIN 2.4* 2.1* 2.0* 2.1*   CBG: Recent Labs  Lab 11/22/21 1619 11/22/21 2125 11/23/21 0823 11/23/21 0825 11/23/21 1155  GLUCAP 135* 119* >600* 149* 99    Discharge time spent: greater than 30 minutes.  Signed: Raiford Noble, DO [facility] 11/23/2021

## 2021-11-23 NOTE — Progress Notes (Signed)
Progress Note  Patient Name: Meagan Wallace Date of Encounter: 11/23/2021  [facility] Cardiologist: Kirk Ruths, MD   Subjective   No CP or dyspnea  Inpatient Medications    Scheduled Meds:  diltiazem  30 mg Oral Q6H   insulin aspart  0-5 Units Subcutaneous QHS   insulin aspart  0-9 Units Subcutaneous TID WC   potassium chloride  40 mEq Oral BID   Continuous Infusions:  cefTRIAXone (ROCEPHIN)  IV 2 g (11/22/21 1755)   heparin 2,300 Units/hr (11/23/21 0735)   lactated ringers 75 mL/hr at 11/23/21 0735   magnesium sulfate bolus IVPB     PRN Meds: acetaminophen, ondansetron (ZOFRAN) IV   Vital Signs    Vitals:   11/23/21 0229 11/23/21 0415 11/23/21 0500 11/23/21 0734  BP:  127/78  125/83  Pulse: 95 88  80  Resp:  20  20  Temp:  98.5 F (36.9 C)  97.9 F (36.6 C)  TempSrc:  Oral  Oral  SpO2: 97% 97%  94%  Weight:   100.7 kg   Height:        Intake/Output Summary (Last 24 hours) at 11/23/2021 0914 Last data filed at 11/23/2021 0735 Gross per 24 hour  Intake 1766.3 ml  Output 2600 ml  Net -833.7 ml   Last 3 Weights 11/23/2021 11/20/2021 08/04/2021  Weight (lbs) 222 lb 0.1 oz 250 lb 7.1 oz 249 lb 1.9 oz  Weight (kg) 100.7 kg 113.6 kg 113 kg      Telemetry    Atrial fibrillation rate controlled - Personally Reviewed   Physical Exam   GEN: No acute distress.   Neck: No JVD Cardiac: irregular Respiratory: Clear to auscultation bilaterally. GI: Soft, nontender, non-distended  MS: No edema Neuro:  Nonfocal  Psych: Normal affect   Labs    High Sensitivity Troponin:   Recent Labs  Lab 11/20/21 1536 11/20/21 1830  TROPONINIHS 35* 11     Chemistry Recent Labs  Lab 11/21/21 0307 11/22/21 0959 11/23/21 0225  NA 134* 136 140  K 3.6 3.4* 3.2*  CL 99 103 104  CO2 23 24 25   GLUCOSE 163* 181* 135*  BUN 44* 20 16  CREATININE 1.37* 1.04* 1.18*  CALCIUM 8.9 8.5* 8.9  MG 1.7 1.7 1.8  PROT 5.5* 5.6* 5.8*  ALBUMIN 2.1* 2.0* 2.1*  AST 11*  11* 13*  ALT 12 12 14   ALKPHOS 103 112 110  BILITOT 0.4 0.5 0.4  GFRNONAA 40* 55* 48*  ANIONGAP 12 9 11      Hematology Recent Labs  Lab 11/21/21 0307 11/22/21 0959 11/23/21 0225  WBC 12.7* 7.7 8.9  RBC 4.03 3.85*   3.82* 4.01  HGB 10.5* 10.0* 10.2*  HCT 33.1* 30.7* 32.2*  MCV 82.1 79.7* 80.3  MCH 26.1 26.0 25.4*  MCHC 31.7 32.6 31.7  RDW 15.7* 15.7* 15.6*  PLT 176 136* 142*   Thyroid  Recent Labs  Lab 11/20/21 1536 11/20/21 1642  TSH  --  2.014  FREET4 1.59*  --     BNP Recent Labs  Lab 11/20/21 1642  BNP 184.3*    DDimer  Recent Labs  Lab 11/20/21 1536  DDIMER 1.79*     Radiology    ECHOCARDIOGRAM COMPLETE  Result Date: 11/21/2021    ECHOCARDIOGRAM REPORT   Patient Name:   Meagan Wallace Date of Exam: 11/21/2021 Medical Rec #:  [id number]           Height:  65.0 in Accession #:    [id number]          Weight:       250.4 lb Date of Birth:  Jun 17, 1946          BSA:          2.177 m Patient Age:    76 years            BP:           104/71 mmHg Patient Gender: F                   HR:           76 bpm. Exam Location:  Inpatient Procedure: 2D Echo Indications:    Afib  History:        Patient has no prior history of Echocardiogram examinations.                 Arrythmias:Atrial Fibrillation; Risk Factors:Diabetes and                 Hypertension.  Sonographer:    Jefferey Pica Referring Phys: [place]  1. Left ventricular ejection fraction, by estimation, is 55 to 60%. The left ventricle has normal function. The left ventricle has no regional wall motion abnormalities. There is mild left ventricular hypertrophy. Left ventricular diastolic parameters are indeterminate.  2. Right ventricular systolic function is normal. The right ventricular size is normal. There is normal pulmonary artery systolic pressure.  3. The mitral valve is grossly normal. No evidence of mitral valve regurgitation.  4. The aortic valve is grossly normal. Aortic valve  regurgitation is not visualized.  5. The inferior vena cava is normal in size with greater than 50% respiratory variability, suggesting right atrial pressure of 3 mmHg. Comparison(s): No prior Echocardiogram. FINDINGS  Left Ventricle: Left ventricular ejection fraction, by estimation, is 55 to 60%. The left ventricle has normal function. The left ventricle has no regional wall motion abnormalities. The left ventricular internal cavity size was normal in size. There is  mild left ventricular hypertrophy. Left ventricular diastolic parameters are indeterminate. Right Ventricle: The right ventricular size is normal. No increase in right ventricular wall thickness. Right ventricular systolic function is normal. There is normal pulmonary artery systolic pressure. The tricuspid regurgitant velocity is 1.01 m/s, and  with an assumed right atrial pressure of 3 mmHg, the estimated right ventricular systolic pressure is 7.1 mmHg. Left Atrium: Left atrial size was normal in size. Right Atrium: Right atrial size was normal in size. Pericardium: There is no evidence of pericardial effusion. Mitral Valve: The mitral valve is grossly normal. No evidence of mitral valve regurgitation. Tricuspid Valve: The tricuspid valve is grossly normal. Tricuspid valve regurgitation is not demonstrated. Aortic Valve: The aortic valve is grossly normal. Aortic valve regurgitation is not visualized. Aortic valve peak gradient measures 16.0 mmHg. Pulmonic Valve: Pulmonic valve regurgitation is not visualized. Aorta: The aortic root and ascending aorta are structurally normal, with no evidence of dilitation. Venous: The inferior vena cava is normal in size with greater than 50% respiratory variability, suggesting right atrial pressure of 3 mmHg. IAS/Shunts: No atrial level shunt detected by color flow Doppler.  LEFT VENTRICLE PLAX 2D LVIDd:         4.50 cm LVIDs:         3.20 cm LV PW:         1.10 cm LV IVS:        1.20 cm  LVOT diam:     2.00 cm LV  SV:         71 LV SV Index:   33 LVOT Area:     3.14 cm  RIGHT VENTRICLE            IVC RV Basal diam:  2.40 cm    IVC diam: 1.80 cm RV S prime:     9.00 cm/s TAPSE (M-mode): 1.3 cm LEFT ATRIUM             Index        RIGHT ATRIUM           Index LA diam:        4.30 cm 1.98 cm/m   RA Area:     11.50 cm LA Vol (A2C):   57.5 ml 26.42 ml/m  RA Volume:   21.20 ml  9.74 ml/m LA Vol (A4C):   53.7 ml 24.67 ml/m LA Biplane Vol: 59.5 ml 27.34 ml/m  AORTIC VALVE                 PULMONIC VALVE AV Area (Vmax): 2.23 cm     PV Vmax:       0.91 m/s AV Vmax:        200.00 cm/s  PV Peak grad:  3.3 mmHg AV Peak Grad:   16.0 mmHg LVOT Vmax:      142.00 cm/s LVOT Vmean:     85.867 cm/s LVOT VTI:       0.226 m  AORTA Ao Root diam: 3.20 cm Ao Asc diam:  3.50 cm MITRAL VALVE                TRICUSPID VALVE MV Area (PHT): 4.57 cm     TR Peak grad:   4.1 mmHg MV Decel Time: 166 msec     TR Vmax:        101.00 cm/s MV E velocity: 117.00 cm/s MV A velocity: 109.00 cm/s  SHUNTS MV E/A ratio:  1.07         Systemic VTI:  0.23 m                             Systemic Diam: 2.00 cm Phineas Inches Electronically signed by Phineas Inches Signature Date/Time: 11/21/2021/4:44:49 PM    Final     Patient Profile     76 y.o. female with past medical history of breast cancer, cirrhosis, diabetes mellitus, hypertension, hyperlipidemia, question CVA admitted with diarrhea being evaluated for atrial fibrillation which is new in onset.  Assessment & Plan    1 persistent atrial fibrillation-patient remains in atrial fibrillation this morning.  Duration of her arrhythmia is unclear.  We will discontinue regular Cardizem and treat with Cardizem CD 120 mg daily.  Plan to discontinue IV heparin and add apixaban 5 mg twice daily.  Patient will follow-up in the office in 4 weeks.  We can discuss whether to proceed with cardioversion or continue with rate control at that time.  I would favor 1 attempt at cardioversion and if she does not hold sinus rhythm  rate control afterwards as she is asymptomatic.  2 elevated free T4-Per primary care.  3 cirrhosis  4 hypokalemia-supplement  Cardiology will sign off.  Please call with questions.  Continue present cardiac medications at discharge.  For questions or updates, please contact [place] Please consult [email or web address] for contact info under  Signed, Kirk Ruths, MD  11/23/2021, 9:14 AM

## 2021-11-23 NOTE — Assessment & Plan Note (Addendum)
-  Patient's T4 was elevated at 1.59 and TSH was 2.014 -Repeat TFTs in 4-6 weeks and also get T3 as an outpatient

## 2021-11-23 NOTE — Progress Notes (Signed)
ANTICOAGULATION CONSULT NOTE  Pharmacy Consult for heparin Indication: atrial fibrillation  No Known Allergies  Patient Measurements: Height: 5' 5"  (165.1 cm) Weight: 100.7 kg (222 lb 0.1 oz) IBW/kg (Calculated) : 57 Heparin Dosing Weight: 84 kg   Vital Signs: Temp: 97.9 F (36.6 C) (02/10 0734) Temp Source: Oral (02/10 0734) BP: 125/83 (02/10 0734) Pulse Rate: 80 (02/10 0734)  Labs: Recent Labs    11/20/21 1536 11/20/21 1610 11/20/21 1830 11/20/21 1944 11/21/21 0307 11/21/21 1317 11/22/21 0959 11/22/21 2046 11/23/21 0225  HGB 11.4*   < >  --   --  10.5*  --  10.0*  --  10.2*  HCT 34.9*   < >  --   --  33.1*  --  30.7*  --  32.2*  PLT 175  --   --   --  176  --  136*  --  142*  LABPROT  --   --   --  13.7  --   --  13.1  --   --   INR  --   --   --  1.1  --   --  1.0  --   --   HEPARINUNFRC  --   --   --   --  <0.10*   < > 0.24* 0.38 0.36  CREATININE 1.67*   < >  --   --  1.37*  --  1.04*  --  1.18*  TROPONINIHS 35*  --  11  --   --   --   --   --   --    < > = values in this interval not displayed.     Estimated Creatinine Clearance: 47 mL/min (A) (by C-G formula based on SCr of 1.18 mg/dL (H)).   Medical History: Past Medical History:  Diagnosis Date   Allergy    Anemia    Anxiety    Arthritis    hands, knees   Asthma    Atrial fibrillation (HCC)    Breast cancer (HCC)    right breast   Cataract    bilateral- surgery to remove   Depression    Diabetes mellitus without complication (HCC)    Heart murmur    never has caused any problems per patient   Hyperlipidemia    Hypertension    Personal history of radiation therapy    PONV (postoperative nausea and vomiting)    either   Radiation 02/02/14-03/17/14   Right Breast   Stroke [facility])    at age 4 yrs old, no problems since per patient    Assessment: 76YO female admitted for altered mental status. Upon arrival, patient was in atrial fibrillation with RVR. Past medical history significant for  T2DM, HTN, and CVA. Not on anticoagulation prior to admission.   -Heparin level  therapeutic at 0.36, on 2300 units/hr.  -hg= 10.2 -Apixaban with high cost but this is due to a deductible which should change after the deductible is met.  Goal of Therapy:  Heparin level 0.3-0.7 units/ml Monitor platelets by anticoagulation protocol: Yes   Plan:  -Continue heparin at 2300 units/hr -Heparin level daily wth CBC daily -Will follow po anticoagulation plans  Hildred Laser, PharmD Clinical Pharmacist **Pharmacist phone directory can now be found on [email or web address] ([email or web address]).  Listed under [place].

## 2021-11-23 NOTE — Discharge Instructions (Addendum)
Information on my medicine - ELIQUIS (apixaban)  Why was Eliquis prescribed for you? Eliquis was prescribed for you to reduce the risk of a blood clot forming that can cause a stroke if you have a medical condition called atrial fibrillation (a type of irregular heartbeat). With a recent clot removal it is also being used to prevent the risk of developing another clot.   What do You need to know about Eliquis ? Take your Eliquis TWICE DAILY - one tablet in the morning and one tablet in the evening with or without food. If you have difficulty swallowing the tablet whole please discuss with your pharmacist how to take the medication safely.  Take Eliquis exactly as prescribed by your doctor and DO NOT stop taking Eliquis without talking to the doctor who prescribed the medication.  Stopping may increase your risk of developing a stroke.  Refill your prescription before you run out.  After discharge, you should have regular check-up appointments with your healthcare provider that is prescribing your Eliquis.  In the future your dose may need to be changed if your kidney function or weight changes by a significant amount or as you get older.  What do you do if you miss a dose? If you miss a dose, take it as soon as you remember on the same day and resume taking twice daily.  Do not take more than one dose of ELIQUIS at the same time to make up a missed dose.  Important Safety Information A possible side effect of Eliquis is bleeding. You should call your healthcare provider right away if you experience any of the following: Bleeding from an injury or your nose that does not stop. Unusual colored urine (red or dark brown) or unusual colored stools (red or black). Unusual bruising for unknown reasons. A serious fall or if you hit your head (even if there is no bleeding).  Some medicines may interact with Eliquis and might increase your risk of bleeding or clotting while on Eliquis. To help  avoid this, consult your healthcare provider or pharmacist prior to using any new prescription or non-prescription medications, including herbals, vitamins, non-steroidal anti-inflammatory drugs (NSAIDs) and supplements.  This website has more information on Eliquis (apixaban): http://www.eliquis.com/eliquis/home

## 2021-11-23 NOTE — TOC Initial Note (Signed)
Transition of Care [facility]) - Initial/Assessment Note    Patient Details  Name: Meagan Wallace MRN: [id number] Date of Birth: September 02, 1946  Transition [facility] Care [facility]) CM/SW Contact:    Milas Gain, [place] Phone Number: 11/23/2021, 10:26 AM  Clinical Narrative:                  CSW received consult for possible SNF placement at time of discharge. CSW spoke with patient at beside regarding PT recommendation of SNF placement at time of discharge. Patient reports she comes from home alone.Patient expressed understanding of PT recommendation and is agreeable to SNF placement at time of discharge. Patient reports preference for [facility] as number one SNF choice . Patient gave CSW permission to fax initial referral near [place] and [place] area.CSW discussed insurance authorization process with patient and will provide patient with medicare compare ratings list with accepted SNF bed offers.Patient reports she has received the COVID vaccines as well a 1 booster. Patient expressed being hopeful for rehab and to feel better soon. No further questions reported at this time. CSW to continue to follow and assist with discharge planning needs.   Expected Discharge Plan: Skilled Nursing Facility Barriers to Discharge: Continued Medical Work up   Patient Goals and CMS Choice Patient states their goals for this hospitalization and ongoing recovery are:: SNF CMS Medicare.gov Compare Post Acute Care list provided to:: Patient Choice offered to / list presented to : Patient  Expected Discharge Plan and Services Expected Discharge Plan: [place] In-house Referral: Clinical Social Work     Living arrangements for the past 2 months: [place]                                      Prior Living Arrangements/Services Living arrangements for the past 2 months: Single Family Home Lives with:: Self Patient language and need for interpreter reviewed:: Yes Do you  feel safe going back to the place where you live?: No   SNF  Need for Family Participation in Patient Care: Yes (Comment) Care giver support system in place?: Yes (comment)   Criminal Activity/Legal Involvement Pertinent to Current Situation/Hospitalization: No - Comment as needed  Activities of Daily Living Home Assistive Devices/Equipment: CBG Meter, Wheelchair, Environmental consultant (specify type) ADL Screening (condition at time of admission) Patient's cognitive ability adequate to safely complete daily activities?: Yes Is the patient deaf or have difficulty hearing?: No Does the patient have difficulty seeing, even when wearing glasses/contacts?: No Does the patient have difficulty concentrating, remembering, or making decisions?: No Patient able to express need for assistance with ADLs?: Yes Does the patient have difficulty dressing or bathing?: No Independently performs ADLs?: Yes (appropriate for developmental age) Does the patient have difficulty walking or climbing stairs?: Yes Weakness of Legs: Both Weakness of Arms/Hands: None  Permission Sought/Granted Permission sought to share information with : Case Manager, Family Supports, Chartered certified accountant granted to share information with : Yes, Verbal Permission Granted  Share Information with NAME: Mechele Claude  Permission granted to share info w AGENCY: SNF  Permission granted to share info w Relationship: daughter n law  Permission granted to share info w Contact Information: Mechele Claude [phone]  Emotional Assessment Appearance:: Appears stated age Attitude/Demeanor/Rapport: Gracious Affect (typically observed): Calm Orientation: : Oriented to Self, Oriented to Place, Oriented to Situation Alcohol / Substance Use: Not Applicable Psych Involvement: No (comment)  Admission diagnosis:  Hypovolemia [E86.1] Atrial fibrillation with rapid ventricular response (HCC) [I48.91] AKI (acute kidney injury) ([place]) [N17.9] Atrial  fibrillation with RVR ([place]) [I48.91] Diarrhea, unspecified type [R19.7] Sepsis with acute renal failure without septic shock, due to unspecified organism, unspecified acute renal failure type ([place]) [A41.9, R65.20, N17.9] Patient Active Problem List   Diagnosis Date Noted   Hypokalemia 11/22/2021   Thrombocytopenia ([place]) 11/22/2021   Pressure injury of skin 11/21/2021   Hyperammonemia ([place]) 11/21/2021   Leukocytosis 11/21/2021   Microcytic anemia 11/21/2021   Hyponatremia 11/21/2021   Acute encephalopathy 11/21/2021   Cirrhosis of liver ([place]) 11/20/2021   Atrial fibrillation with rapid ventricular response ([place]) 11/20/2021   Acute cystitis 11/20/2021   AKI (acute kidney injury) ([place]) 11/20/2021   Acute diarrhea 11/20/2021   Fibroadenoma of right breast 08/02/2016   High risk medication use 01/23/2016   Chronic depression 10/06/2014   Menopausal syndrome (hot flashes) 10/06/2014   Osteopenia determined by x-ray 10/06/2014   History of breast cancer 04/22/2014   Type 2 diabetes mellitus without complications ([place]) 42/59/5638   Obesity, Class III, BMI 40-49.9 (morbid obesity) ([place]) 03/19/2014   Osteopenia 03/19/2014   CVA (cerebral infarction) 03/19/2014   HTN (hypertension) 03/19/2014   DCIS (ductal carcinoma in situ) 12/01/2013   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

## 2021-11-25 LAB — CULTURE, BLOOD (ROUTINE X 2)
Culture: NO GROWTH
Culture: NO GROWTH

## 2021-11-26 LAB — GLUCOSE, CAPILLARY: Glucose-Capillary: >600 mg/dL (ref 70–99)

## 2021-12-24 NOTE — Progress Notes (Unsigned)
Cardiology Clinic Note   Patient Name: Meagan Wallace Date of Encounter: 12/24/2021  Primary Care Provider:  Charlynn Court, NP Primary Cardiologist:  Kirk Ruths, MD  Patient Profile    ***  Past Medical History    Past Medical History:  Diagnosis Date   Allergy    Anemia    Anxiety    Arthritis    hands, knees   Asthma    Atrial fibrillation ([place])    Breast cancer ([place])    right breast   Cataract    bilateral- surgery to remove   Depression    Diabetes mellitus without complication ([place])    Heart murmur    never has caused any problems per patient   Hyperlipidemia    Hypertension    Personal history of radiation therapy    PONV (postoperative nausea and vomiting)    either   Radiation 02/02/14-03/17/14   Right Breast   Stroke [facility])    at age 76 yrs old, no problems since per patient   Past Surgical History:  Procedure Laterality Date   APPENDECTOMY  1987   BIOPSY  06/12/2021   Procedure: BIOPSY;  Surgeon: Otis Brace, MD;  Location: [facility];  Service: Gastroenterology;;   BREAST BIOPSY Right    BREAST LUMPECTOMY     right breast 2015   BREAST LUMPECTOMY WITH RADIOACTIVE SEED LOCALIZATION Right 12/27/2013   Procedure: BREAST LUMPECTOMY WITH RADIOACTIVE SEED LOCALIZATION;  Surgeon: Marcello Moores A. Cornett, MD;  Location: [place];  Service: General;  Laterality: Right;   COLONOSCOPY  10/27/2017   polyps/Danis   COLONOSCOPY WITH PROPOFOL N/A 06/12/2021   Procedure: COLONOSCOPY WITH PROPOFOL;  Surgeon: Otis Brace, MD;  [facility];  Service: Gastroenterology;  Laterality: N/A;   ESOPHAGOGASTRODUODENOSCOPY (EGD) WITH PROPOFOL N/A 06/12/2021   Procedure: ESOPHAGOGASTRODUODENOSCOPY (EGD) WITH PROPOFOL;  Surgeon: Otis Brace, MD;  [facility];  Service: Gastroenterology;  Laterality: N/A;   EXPLORATORY LAPAROTOMY  1987   EYE SURGERY Bilateral    cataracts removed   POLYPECTOMY  06/12/2021   Procedure:  POLYPECTOMY;  Surgeon: Otis Brace, MD;  [facility];  Service: Gastroenterology;;   SHOULDER SURGERY  2011   right    Allergies  No Known Allergies  History of Present Illness    ***  Home Medications    Prior to Admission medications   Medication Sig Start Date End Date Taking? Authorizing Provider  acetaminophen (TYLENOL) 500 MG tablet Take 500 mg by mouth every 6 (six) hours as needed for moderate pain.    [provider]  albuterol (PROVENTIL) (2.5 MG/3ML) 0.083% nebulizer solution Take 2.5 mg by nebulization every 6 (six) hours as needed for wheezing or shortness of breath.    [provider]  apixaban (ELIQUIS) 5 MG TABS tablet Take 1 tablet (5 mg total) by mouth 2 (two) times daily. 11/23/21   Raiford Noble Latif, DO  diltiazem (CARDIZEM CD) 120 MG 24 hr capsule Take 1 capsule (120 mg total) by mouth daily. 11/24/21   Raiford Noble Latif, DO  furosemide (LASIX) 20 MG tablet Take 20 mg by mouth daily. 09/05/18   [provider]  metFORMIN (GLUCOPHAGE) 500 MG tablet Take 2 tablets (1,000 mg total) by mouth 2 (two) times daily with a meal. 07/01/19   Nicholas Lose, MD  nystatin (MYCOSTATIN/NYSTOP) powder Apply 1 application topically as needed for other. Apply under both breasts for sweating/ moisture    [provider]  pravastatin (PRAVACHOL) 10  MG tablet Take 10 mg by mouth at bedtime.    [provider]  ramipril (ALTACE) 10 MG capsule Take 10 mg by mouth daily.    [provider]  Semaglutide,0.25 or 0.'5MG'$ /DOS, (OZEMPIC, 0.25 OR 0.5 MG/DOSE,) 2 MG/1.5ML SOPN Inject 0.5 mg into the skin every Thursday.    [provider]  venlafaxine XR (EFFEXOR-XR) 150 MG 24 hr capsule Take 150 mg by mouth daily with breakfast. 01/04/16   [provider]    Family History    Family History  Problem Relation Age of Onset   Heart disease Mother    Stroke Father    Breast cancer Paternal Aunt    Colon  cancer Neg Hx    Esophageal cancer Neg Hx    Rectal cancer Neg Hx    Stomach cancer Neg Hx    She indicated that her mother is deceased. She indicated that her father is deceased. She indicated that the status of her paternal aunt is unknown. She indicated that the status of her neg hx is unknown.  Social History    Social History   Socioeconomic History   Marital status: Widowed    Spouse name: Not on file   Number of children: 0   Years of education: Not on file   Highest education level: Not on file  Occupational History   Not on file  Tobacco Use   Smoking status: Never   Smokeless tobacco: Never  Vaping Use   Vaping Use: Never used  Substance and Sexual Activity   Alcohol use: No   Drug use: No   Sexual activity: Not Currently    Birth control/protection: Post-menopausal  Other Topics Concern   Not on file  Social History Narrative   Husband passed away in 01/22/2015   Social Determinants of Health   Financial Resource Strain: Not on file  Food Insecurity: Not on file  Transportation Needs: Not on file  Physical Activity: Not on file  Stress: Not on file  Social Connections: Not on file  Intimate Partner Violence: Not on file     Review of Systems    General:  No chills, fever, night sweats or weight changes.  Cardiovascular:  No chest pain, dyspnea on exertion, edema, orthopnea, palpitations, paroxysmal nocturnal dyspnea. Dermatological: No rash, lesions/masses Respiratory: No cough, dyspnea Urologic: No hematuria, dysuria Abdominal:   No nausea, vomiting, diarrhea, bright red blood per rectum, melena, or hematemesis Neurologic:  No visual changes, wkns, changes in mental status. All other systems reviewed and are otherwise negative except as noted above.  Physical Exam    VS:  There were no vitals taken for this visit. , BMI There is no height or weight on file to calculate BMI. GEN: Well nourished, well developed, in no acute distress. HEENT:  normal. Neck: Supple, no JVD, carotid bruits, or masses. Cardiac: RRR, no murmurs, rubs, or gallops. No clubbing, cyanosis, edema.  Radials/DP/PT 2+ and equal bilaterally.  Respiratory:  Respirations regular and unlabored, clear to auscultation bilaterally. GI: Soft, nontender, nondistended, BS + x 4. MS: no deformity or atrophy. Skin: warm and dry, no rash. Neuro:  Strength and sensation are intact. Psych: Normal affect.  Accessory Clinical Findings    Recent Labs: 11/20/2021: B Natriuretic Peptide 184.3; TSH 2.014 11/23/2021: ALT 14; BUN 16; Creatinine, Ser 1.18; Hemoglobin 10.2; Magnesium 1.8; Platelets 142; Potassium 3.2; Sodium 140   Recent Lipid Panel No results found for: CHOL, TRIG, HDL, CHOLHDL, VLDL, LDLCALC, LDLDIRECT  ECG personally  reviewed by me today- *** - No acute changes  Assessment & Plan   1.  ***   Jossie Ng. Johanthan Kneeland NP-C    12/24/2021, 7:09 AM [place] [address] Office [phone] Fax [phone]  Notice: This dictation was prepared with Dragon dictation along with smaller phrase technology. Any transcriptional errors that result from this process are unintentional and may not be corrected upon review.  I spent***minutes examining this patient, reviewing medications, and using patient centered shared decision making involving her cardiac care.  Prior to her visit I spent greater than 20 minutes reviewing her past medical history,  medications, and prior cardiac tests.

## 2021-12-27 ENCOUNTER — Ambulatory Visit: Payer: Medicare Other | Admitting: General Practice

## 2022-01-03 ENCOUNTER — Encounter: Payer: Self-pay | Admitting: General Practice

## 2022-01-25 ENCOUNTER — Encounter: Payer: Self-pay | Admitting: Nurse Practitioner

## 2022-01-25 ENCOUNTER — Ambulatory Visit (INDEPENDENT_AMBULATORY_CARE_PROVIDER_SITE_OTHER): Payer: Medicare Other | Admitting: Nurse Practitioner

## 2022-01-25 VITALS — BP 140/71 | HR 84 | Ht 65.0 in | Wt 225.4 lb

## 2022-01-25 DIAGNOSIS — E782 Mixed hyperlipidemia: Secondary | ICD-10-CM

## 2022-01-25 DIAGNOSIS — N179 Acute kidney failure, unspecified: Secondary | ICD-10-CM

## 2022-01-25 DIAGNOSIS — R600 Localized edema: Secondary | ICD-10-CM

## 2022-01-25 DIAGNOSIS — I10 Essential (primary) hypertension: Secondary | ICD-10-CM | POA: Diagnosis not present

## 2022-01-25 DIAGNOSIS — I4819 Other persistent atrial fibrillation: Secondary | ICD-10-CM | POA: Diagnosis not present

## 2022-01-25 DIAGNOSIS — R7989 Other specified abnormal findings of blood chemistry: Secondary | ICD-10-CM

## 2022-01-25 DIAGNOSIS — D509 Iron deficiency anemia, unspecified: Secondary | ICD-10-CM

## 2022-01-25 DIAGNOSIS — E119 Type 2 diabetes mellitus without complications: Secondary | ICD-10-CM

## 2022-01-25 DIAGNOSIS — E876 Hypokalemia: Secondary | ICD-10-CM

## 2022-01-25 NOTE — Progress Notes (Signed)
? ? ?Office Visit  ?  ?Patient Name: Meagan Wallace ?Date of Encounter: 01/25/2022 ? ?Primary Care Provider:  Charlynn Court, NP ?Primary Cardiologist:  Kirk Ruths, MD ? ?Chief Complaint  ?  ?76 year old female with a history of persistent atrial fibrillation, hypertension, hyperlipidemia, type 2 diabetes, cirrhosis, breast cancer, and microcytic anemia, who presents for posthospital follow-up related to atrial fibrillation. ? ?Past Medical History  ?  ?Past Medical History:  ?Diagnosis Date  ? Allergy   ? Anemia   ? Anxiety   ? Arthritis   ? hands, knees  ? Asthma   ? Atrial fibrillation ([place])   ? Breast cancer ([place])   ? right breast  ? Cataract   ? bilateral- surgery to remove  ? Depression   ? Diabetes mellitus without complication ([place])   ? Heart murmur   ? never has caused any problems per patient  ? Hyperlipidemia   ? Hypertension   ? Personal history of radiation therapy   ? PONV (postoperative nausea and vomiting)   ? either  ? Radiation 02/02/14-03/17/14  ? Right Breast  ? Stroke [facility])   ? at age 51 yrs old, no problems since per patient  ? ?Past Surgical History:  ?Procedure Laterality Date  ? APPENDECTOMY  1987  ? BIOPSY  06/12/2021  ? Procedure: BIOPSY;  Surgeon: Otis Brace, MD;  [facility];  Service: Gastroenterology;;  ? BREAST BIOPSY Right   ? BREAST LUMPECTOMY    ? right breast 2015  ? BREAST LUMPECTOMY WITH RADIOACTIVE SEED LOCALIZATION Right 12/27/2013  ? Procedure: BREAST LUMPECTOMY WITH RADIOACTIVE SEED LOCALIZATION;  Surgeon: Joyice Faster. Cornett, MD;  Location: [place];  Service: General;  Laterality: Right;  ? COLONOSCOPY  10/27/2017  ? polyps/Danis  ? COLONOSCOPY WITH PROPOFOL N/A 06/12/2021  ? Procedure: COLONOSCOPY WITH PROPOFOL;  Surgeon: Otis Brace, MD;  [facility];  Service: Gastroenterology;  Laterality: N/A;  ? ESOPHAGOGASTRODUODENOSCOPY (EGD) WITH PROPOFOL N/A 06/12/2021  ? Procedure: ESOPHAGOGASTRODUODENOSCOPY (EGD) WITH  PROPOFOL;  Surgeon: Otis Brace, MD;  [facility];  Service: Gastroenterology;  Laterality: N/A;  ? [place]  ? EYE SURGERY Bilateral   ? cataracts removed  ? POLYPECTOMY  06/12/2021  ? Procedure: POLYPECTOMY;  Surgeon: Otis Brace, MD;  [facility];  Service: Gastroenterology;;  ? SHOULDER SURGERY  2011  ? right  ? ? ?Allergies ? ?No Known Allergies ? ?History of Present Illness  ?  ?76 year old female with the above past medical history including persistent atrial fibrillation, hypertension, hyperlipidemia, type 2 diabetes, cirrhosis, breast cancer, and microcytic anemia. ? ?She presented to the ED on 11/20/2021 with diarrhea and weakness, acute encephalopathy.  She was noted to be in atrial fibrillation with rapid ventricular response.  CT of the head showed mild chronic ischemic white matter changes, no acute intracranial process.  Ammonia level was initially elevated at 52, improved to 40.  Cardiology was consulted in the setting of fibrillation of uncertain duration.  Free T4 was elevated, management per primary team.  Echocardiogram showed EF 55 to 60%, no RWMA, mild LVH, no valvular abnormalities. She was started on diltiazem (CHA2DS2VASc = 7) and Eliquis with plans for possible outpatient DCCV following 21 days of uninterrupted anticoagulation.  However, if 1 attempted cardioversion unsuccessful, rate control strategy recommend for future given she was asymptomatic.  She was discharged home in stable condition on 11/23/2021. ? ?She presents today for follow-up accompanied by her brother. Since her hospitalization she  has been stable from a cardiac standpoint.  She denies dyspnea, palpitations, worsening edema, weight gain, PND, orthopnea, denies symptoms concerning for angina.  EKG today shows normal sinus rhythm.  She has been adherent to her Eliquis, denies bleeding.  Overall, she reports feeling better since her hospital discharge, and denies any new  concerns today. ? ?Home Medications  ?  ?Current Outpatient Medications  ?Medication Sig Dispense Refill  ? acetaminophen (TYLENOL) 500 MG tablet Take 500 mg by mouth every 6 (six) hours as needed for moderate pain.    ? albuterol (PROVENTIL) (2.5 MG/3ML) 0.083% nebulizer solution Take 2.5 mg by nebulization every 6 (six) hours as needed for wheezing or shortness of breath.    ? apixaban (ELIQUIS) 5 MG TABS tablet Take 1 tablet (5 mg total) by mouth 2 (two) times daily. 60 tablet 0  ? diltiazem (CARDIZEM CD) 120 MG 24 hr capsule Take 1 capsule (120 mg total) by mouth daily. 30 capsule 0  ? furosemide (LASIX) 20 MG tablet Take 20 mg by mouth daily.    ? metFORMIN (GLUCOPHAGE) 500 MG tablet Take 2 tablets (1,000 mg total) by mouth 2 (two) times daily with a meal.    ? nystatin (MYCOSTATIN/NYSTOP) powder Apply 1 application topically as needed for other. Apply under both breasts for sweating/ moisture    ? pravastatin (PRAVACHOL) 10 MG tablet Take 10 mg by mouth at bedtime.    ? ramipril (ALTACE) 10 MG capsule Take 10 mg by mouth daily.    ? Semaglutide,0.25 or 0.'5MG'$ /DOS, (OZEMPIC, 0.25 OR 0.5 MG/DOSE,) 2 MG/1.5ML SOPN Inject 0.5 mg into the skin every Thursday.    ? venlafaxine XR (EFFEXOR-XR) 150 MG 24 hr capsule Take 150 mg by mouth daily with breakfast.    ? ?No current facility-administered medications for this visit.  ?  ? ?Review of Systems  ?  ?She denies chest pain, palpitations, dyspnea, pnd, orthopnea, n, v, dizziness, syncope, weight gain, or early satiety. All other systems reviewed and are otherwise negative except as noted above.  ? ?Physical Exam  ?  ?VS:  BP 140/71   Pulse 84   Ht '5\' 5"'$  (1.651 m)   Wt 225 lb 6.4 oz (102.2 kg)   SpO2 97%   BMI 37.51 kg/m?  ?GEN: Well nourished, well developed, in no acute distress. ?HEENT: normal. ?Neck: Supple, no JVD, carotid bruits, or masses. ?Cardiac: RRR, no murmurs, rubs, or gallops. No clubbing, cyanosis, non-pitting bilateral lower extremity edema.   Radials/DP/PT 2+ and equal bilaterally.  ?Respiratory:  Respirations regular and unlabored, clear to auscultation bilaterally. ?GI: Soft, nontender, nondistended, BS + x 4. ?MS: no deformity or atrophy. ?Skin: warm and dry, no rash. ?Neuro:  Strength and sensation are intact. ?Psych: Normal affect. ? ?Accessory Clinical Findings  ?  ?ECG personally reviewed by me today -NSR, 84 bpm-no acute changes. ? ?Lab Results  ?Component Value Date  ? WBC 8.9 11/23/2021  ? HGB 10.2 (L) 11/23/2021  ? HCT 32.2 (L) 11/23/2021  ? MCV 80.3 11/23/2021  ? PLT 142 (L) 11/23/2021  ? ?Lab Results  ?Component Value Date  ? CREATININE 1.18 (H) 11/23/2021  ? BUN 16 11/23/2021  ? NA 140 11/23/2021  ? K 3.2 (L) 11/23/2021  ? CL 104 11/23/2021  ? CO2 25 11/23/2021  ? ?Lab Results  ?Component Value Date  ? ALT 14 11/23/2021  ? AST 13 (L) 11/23/2021  ? ALKPHOS 110 11/23/2021  ? BILITOT 0.4 11/23/2021  ? ?No results found for:  CHOL, HDL, LDLCALC, LDLDIRECT, TRIG, CHOLHDL  ?Lab Results  ?Component Value Date  ? HGBA1C 5.5 11/22/2021  ? ? ?Assessment & Plan  ?  ?1. Persistent atrial fibrillation: Newly diagnosed, of uncertain duration. Echo showed EF 55 to 60%, no RWMA, mild LVH, no valvular abnormalities. CHA2DS2VASc = 7.  EKG today shows NSR, 84 bpm.  Therefore, no indication for DCCV at this time.  Discussed possibility of recurrence of atrial fibrillation in the future, warning signs/symptoms/ED precautions, and importance of adherence to Eliquis should she require DCCV in the future. Currently denies bleeding on Eliquis. Continue Eliquis, diltiazem. ? ?2. Chronic bilateral lower extremity edema: Most recent echo as above. Euvolemic and well compensated on exam. ?Continue Lasix. ? ?3. Hypertension: BP slightly above goal in office today, I encouraged her to continue to monitor BP at home and report BP consistently> 130/80.  Amlodipine was discontinued at recent hospitalization due to hypotension.  If BP remains elevated above goal, consider  reintroduction of amlodipine.  For now, continue current antihypertensive regimen. ? ?4. Hyperlipidemia: Recent LDL on file. She is on pravastatin. Monitored and managed per PCP. ? ?5. Type 2 diabetes: A1c was 5.5 in February

## 2022-01-25 NOTE — Patient Instructions (Signed)
Medication Instructions:  ?The current medical regimen is effective;  continue present plan and medications as directed. Please refer to the Current Medication list given to you today.  ? ?*If you need a refill on your cardiac medications before your next appointment, please call your pharmacy* ? ?Lab Work:    ?CBC AND BMET TODAY     ? ?Special Instructions ?TAKE AND LOG YOUR BLOOD PRESSURE DAILY, CALL IF BLOOD PRESSURE IS GREATER 130/80 ? ?Follow-Up: ?Your next appointment:  3 month(s) In Person with Kirk Ruths, MD    ? ?At [facility], you and your health needs are our priority.  As part of our continuing mission to provide you with exceptional heart care, we have created designated Provider Care Teams.  These Care Teams include your primary Cardiologist (physician) and Advanced Practice Providers (APPs -  Physician Assistants and Nurse Practitioners) who all work together to provide you with the care you need, when you need it. ? ? ?

## 2022-01-26 LAB — CBC
Hematocrit: 36 % (ref 34.0–46.6)
Hemoglobin: 11.5 g/dL (ref 11.1–15.9)
MCH: 26.3 pg — ABNORMAL LOW (ref 26.6–33.0)
MCHC: 31.9 g/dL (ref 31.5–35.7)
MCV: 82 fL (ref 79–97)
Platelets: 236 10*3/uL (ref 150–450)
RBC: 4.38 x10E6/uL (ref 3.77–5.28)
RDW: 14.1 % (ref 11.7–15.4)
WBC: 14.1 10*3/uL — ABNORMAL HIGH (ref 3.4–10.8)

## 2022-01-26 LAB — BASIC METABOLIC PANEL
BUN/Creatinine Ratio: 23 (ref 12–28)
BUN: 21 mg/dL (ref 8–27)
CO2: 23 mmol/L (ref 20–29)
Calcium: 10.4 mg/dL — ABNORMAL HIGH (ref 8.7–10.3)
Chloride: 102 mmol/L (ref 96–106)
Creatinine, Ser: 0.9 mg/dL (ref 0.57–1.00)
Glucose: 117 mg/dL — ABNORMAL HIGH (ref 70–99)
Potassium: 4 mmol/L (ref 3.5–5.2)
Sodium: 141 mmol/L (ref 134–144)
eGFR: 66 mL/min/{1.73_m2} (ref >59)

## 2022-01-30 ENCOUNTER — Telehealth: Payer: Self-pay

## 2022-01-30 MED ORDER — AMLODIPINE BESYLATE 5 MG PO TABS: 5.0000 mg | ORAL_TABLET | Freq: Every day | ORAL | 2 refills | Status: DC

## 2022-01-30 NOTE — Telephone Encounter (Signed)
Lmom waiting on a return call to discuss results and symptoms reported. Sent Amlodipine 5 mg daily into pts pharmacy. ?

## 2022-01-30 NOTE — Telephone Encounter (Signed)
Pt returned call and is aware of Diona Browner, NP recommendations. Pt is aware that RX was sent to her pharmacy. ?

## 2022-02-15 ENCOUNTER — Other Ambulatory Visit: Payer: Self-pay | Admitting: Gastroenterology

## 2022-02-15 DIAGNOSIS — K7469 Other cirrhosis of liver: Secondary | ICD-10-CM

## 2022-02-21 ENCOUNTER — Ambulatory Visit
Admission: RE | Admit: 2022-02-21 | Discharge: 2022-02-21 | Disposition: A | Discharge status: Home / Self Care | Payer: Medicare Other | Source: Ambulatory Visit | Attending: Gastroenterology | Admitting: Gastroenterology

## 2022-02-21 DIAGNOSIS — K7469 Other cirrhosis of liver: Secondary | ICD-10-CM

## 2022-03-25 ENCOUNTER — Other Ambulatory Visit: Payer: Self-pay | Admitting: Nurse Practitioner

## 2022-05-13 NOTE — Progress Notes (Signed)
HPI: Follow-up atrial fibrillation.  Patient was admitted February 2023 with encephalopathy, diarrhea and weakness.  She was found to be in atrial fibrillation.  At that time echocardiogram showed ejection fraction 55 to 60%, mild left ventricular hypertrophy.  She was treated with Cardizem and apixaban with plans for outpatient cardioversion.  However at time of follow-up she had converted to sinus rhythm.  Since last seen she denies dyspnea, chest pain, palpitations or syncope.  Occasional mild pedal edema.  No bleeding.  Current Outpatient Medications  Medication Sig Dispense Refill   acetaminophen (TYLENOL) 500 MG tablet Take 500 mg by mouth every 6 (six) hours as needed for moderate pain.     albuterol (PROVENTIL) (2.5 MG/3ML) 0.083% nebulizer solution Take 2.5 mg by nebulization every 6 (six) hours as needed for wheezing or shortness of breath.     amLODipine (NORVASC) 5 MG tablet TAKE 1 TABLET (5 MG TOTAL) BY MOUTH DAILY. 90 tablet 2   apixaban (ELIQUIS) 5 MG TABS tablet Take 1 tablet (5 mg total) by mouth 2 (two) times daily. 60 tablet 0   diltiazem (CARDIZEM CD) 120 MG 24 hr capsule Take 1 capsule (120 mg total) by mouth daily. 30 capsule 0   furosemide (LASIX) 20 MG tablet Take 20 mg by mouth 2 (two) times daily.     glimepiride (AMARYL) 4 MG tablet Take 4 mg by mouth daily with breakfast.     metFORMIN (GLUCOPHAGE) 500 MG tablet Take 2 tablets (1,000 mg total) by mouth 2 (two) times daily with a meal. (Patient taking differently: Take 1,000 mg by mouth daily with breakfast.)     nystatin (MYCOSTATIN/NYSTOP) powder Apply 1 application topically as needed for other. Apply under both breasts for sweating/ moisture     pravastatin (PRAVACHOL) 10 MG tablet Take 10 mg by mouth at bedtime.     ramipril (ALTACE) 10 MG capsule Take 10 mg by mouth daily.     Semaglutide,0.25 or 0.'5MG'$ /DOS, (OZEMPIC, 0.25 OR 0.5 MG/DOSE,) 2 MG/1.5ML SOPN Inject 0.5 mg into the skin every Thursday.      venlafaxine XR (EFFEXOR-XR) 150 MG 24 hr capsule Take 150 mg by mouth daily with breakfast.     No current facility-administered medications for this visit.     Past Medical History:  Diagnosis Date   Allergy    Anemia    Anxiety    Arthritis    hands, knees   Asthma    Atrial fibrillation (HCC)    Breast cancer (HCC)    right breast   Cataract    bilateral- surgery to remove   Depression    Diabetes mellitus without complication (HCC)    Heart murmur    never has caused any problems per patient   Hyperlipidemia    Hypertension    Personal history of radiation therapy    PONV (postoperative nausea and vomiting)    either   Radiation 02/02/14-03/17/14   Right Breast   Stroke [facility])    at age 71 yrs old, no problems since per patient    Past Surgical History:  Procedure Laterality Date   APPENDECTOMY  1987   BIOPSY  06/12/2021   Procedure: BIOPSY;  Surgeon: Otis Brace, MD;  Location: [facility];  Service: Gastroenterology;;   BREAST BIOPSY Right    BREAST LUMPECTOMY     right breast 2015   BREAST LUMPECTOMY WITH RADIOACTIVE SEED LOCALIZATION Right 12/27/2013   Procedure: BREAST LUMPECTOMY WITH RADIOACTIVE SEED LOCALIZATION;  Surgeon:  Thomas A. Cornett, MD;  Location: [place];  Service: General;  Laterality: Right;   COLONOSCOPY  10/27/2017   polyps/Danis   COLONOSCOPY WITH PROPOFOL N/A 06/12/2021   Procedure: COLONOSCOPY WITH PROPOFOL;  Surgeon: Otis Brace, MD;  [facility];  Service: Gastroenterology;  Laterality: N/A;   ESOPHAGOGASTRODUODENOSCOPY (EGD) WITH PROPOFOL N/A 06/12/2021   Procedure: ESOPHAGOGASTRODUODENOSCOPY (EGD) WITH PROPOFOL;  Surgeon: Otis Brace, MD;  [facility];  Service: Gastroenterology;  Laterality: N/A;   EXPLORATORY LAPAROTOMY  1987   EYE SURGERY Bilateral    cataracts removed   POLYPECTOMY  06/12/2021   Procedure: POLYPECTOMY;  Surgeon: Otis Brace, MD;  [facility]: [facility];   Service: Gastroenterology;;   SHOULDER SURGERY  January 22, 2010   right    Social History   Socioeconomic History   Marital status: Widowed    Spouse name: Not on file   Number of children: 0   Years of education: Not on file   Highest education level: Not on file  Occupational History   Not on file  Tobacco Use   Smoking status: Never   Smokeless tobacco: Never  Vaping Use   Vaping Use: Never used  Substance and Sexual Activity   Alcohol use: No   Drug use: No   Sexual activity: Not Currently    Birth control/protection: Post-menopausal  Other Topics Concern   Not on file  Social History Narrative   Husband passed away in 01/23/2015   Social Determinants of Health   Financial Resource Strain: Not on file  Food Insecurity: Not on file  Transportation Needs: Not on file  Physical Activity: Not on file  Stress: Not on file  Social Connections: Not on file  Intimate Partner Violence: Not on file    Family History  Problem Relation Age of Onset   Heart disease Mother    Stroke Father    Breast cancer Paternal Aunt    Colon cancer Neg Hx    Esophageal cancer Neg Hx    Rectal cancer Neg Hx    Stomach cancer Neg Hx     ROS: no fevers or chills, productive cough, hemoptysis, dysphasia, odynophagia, melena, hematochezia, dysuria, hematuria, rash, seizure activity, orthopnea, PND, claudication. Remaining systems are negative.  Physical Exam: Well-developed well-nourished in no acute distress.  Skin is warm and dry.  HEENT is normal.  Neck is supple.  Chest is clear to auscultation with normal expansion.  Cardiovascular exam is regular rate and rhythm.  Abdominal exam nontender or distended. No masses palpated. Extremities show 1+ edema. neuro grossly intact  A/P  1 paroxysmal atrial fibrillation-LV function is normal.  She remains in sinus rhythm.  Continue Cardizem for rate control if atrial fibrillation recurs.  Continue apixaban.  If she has recurrent atrial fibrillation in  the future rate control and anticoagulation may be the best option as she is asymptomatic.  2 lower extremity edema-continue diuretic at present dose.  She is scheduled to see primary care tomorrow.  Apparently he follows her potassium and renal function.  3 hypertension-patient's blood pressure is controlled.  However she is on 2 calcium channel blockers.  We will discontinue amlodipine.  Follow blood pressure and can advance Cardizem if needed.  4 hyperlipidemia-continue statin.  5 history of elevated free T4-follow-up primary care.  Kirk Ruths, MD

## 2022-05-27 ENCOUNTER — Ambulatory Visit (INDEPENDENT_AMBULATORY_CARE_PROVIDER_SITE_OTHER): Payer: Medicare Other | Admitting: Cardiology

## 2022-05-27 ENCOUNTER — Encounter: Payer: Self-pay | Admitting: Cardiology

## 2022-05-27 VITALS — BP 124/74 | HR 79 | Temp 77.0°F | Ht 65.0 in | Wt 241.0 lb

## 2022-05-27 DIAGNOSIS — I480 Paroxysmal atrial fibrillation: Secondary | ICD-10-CM

## 2022-05-27 DIAGNOSIS — E782 Mixed hyperlipidemia: Secondary | ICD-10-CM

## 2022-05-27 DIAGNOSIS — I10 Essential (primary) hypertension: Secondary | ICD-10-CM | POA: Diagnosis not present

## 2022-05-27 NOTE — Patient Instructions (Signed)
Medication Instructions:  Stop Amlodipine Continue all other medications *If you need a refill on your cardiac medications before your next appointment, please call your pharmacy*   Lab Work: None ordered   Testing/Procedures: None ordered   Follow-Up: At [facility], you and your health needs are our priority.  As part of our continuing mission to provide you with exceptional heart care, we have created designated Provider Care Teams.  These Care Teams include your primary Cardiologist (physician) and Advanced Practice Providers (APPs -  Physician Assistants and Nurse Practitioners) who all work together to provide you with the care you need, when you need it.  We recommend signing up for the patient portal called "MyChart".  Sign up information is provided on this After Visit Summary.  MyChart is used to connect with patients for Virtual Visits (Telemedicine).  Patients are able to view lab/test results, encounter notes, upcoming appointments, etc.  Non-urgent messages can be sent to your provider as well.   To learn more about what you can do with MyChart, go to [email or web address].      Your next appointment:  6 months with PA  Call in Oct to schedule PA appointment      Call in May to schedule August appointment with Dr.Crenshaw    The format for your next appointment: Office   Provider: Dr.Crenshaw    Important Information About Sugar

## 2022-06-19 ENCOUNTER — Other Ambulatory Visit: Payer: Self-pay | Admitting: *Deleted

## 2022-06-19 DIAGNOSIS — D0511 Intraductal carcinoma in situ of right breast: Secondary | ICD-10-CM

## 2022-06-19 NOTE — Progress Notes (Signed)
Received call from pt with complaint of right breast redness, swelling, and inflammation.  Pt states she was seen by PCP Dr. Truman Hayward with [facility] [phone]) and was prescribed an antibiotic for possible cellulitis with recommendations to f/u in our office. Pt states she has not had a mammogram since 2020.  Verbal orders received from MD for pt to have STAT diagnostic mammogram and right breast US for further evaluation.  Orders placed, appt scheduled for first available time slot with the breast center. MD f/u scheduled as well. Pt educated to continue abx as prescribed by PCP until f/u with our office.  Pt verbalized understanding.

## 2022-06-20 ENCOUNTER — Telehealth: Payer: Self-pay | Admitting: *Deleted

## 2022-06-20 ENCOUNTER — Other Ambulatory Visit: Payer: Self-pay

## 2022-06-20 ENCOUNTER — Encounter: Payer: Self-pay | Admitting: Adult Health

## 2022-06-20 ENCOUNTER — Inpatient Hospital Stay: Payer: Medicare Other | Attending: Adult Health | Admitting: Adult Health

## 2022-06-20 VITALS — BP 158/76 | HR 91 | Temp 97.9°F | Resp 18 | Ht 65.0 in | Wt 246.6 lb

## 2022-06-20 DIAGNOSIS — Z79810 Long term (current) use of selective estrogen receptor modulators (SERMs): Secondary | ICD-10-CM | POA: Diagnosis not present

## 2022-06-20 DIAGNOSIS — D0511 Intraductal carcinoma in situ of right breast: Secondary | ICD-10-CM | POA: Diagnosis present

## 2022-06-20 MED ORDER — AMOXICILLIN-POT CLAVULANATE 875-125 MG PO TABS
1.0000 | ORAL_TABLET | Freq: Two times a day (BID) | ORAL | 0 refills | Status: DC
Start: 2022-06-20 — End: 2022-07-31

## 2022-06-20 NOTE — Progress Notes (Unsigned)
[facility] Follow up:    Meagan Nakai, MD [address] [place] [address]   DIAGNOSIS:  Cancer Staging  Ductal carcinoma in situ (DCIS) of right breast Staging form: Breast, AJCC 7th Edition - Clinical: Stage 0 (Tis, N0, cM0) - Unsigned Specimen type: Core Needle Biopsy Histopathologic type: 9932 Laterality: Right Staging comments: Staged at breast conference 12/01/13.  - Pathologic: No stage assigned - Unsigned Specimen type: Core Needle Biopsy Histopathologic type: 9932 Laterality: Right   SUMMARY OF ONCOLOGIC HISTORY: Oncology History  Ductal carcinoma in situ (DCIS) of right breast  11/11/2013 Initial Diagnosis   Ductal carcinoma in situ (DCIS) of right breast diagnosed at [facility], 1.9 cm mass at 12:00, DCIS high-grade ER/PR negative, MRI revealed 2.6 cm mass along with bilateral enhancements (fibroadenomas on biopsy)   12/27/2013 Surgery   Right lumpectomy with Dr. Brantley Stage: Intermediate grade DCIS ER 90%, PR 0%, [facility]   02/02/2014 - 03/17/2014 Radiation Therapy   Adjuvant radiation therapy by Dr. Pablo Ledger 50 Gy to right breast with 10 Gy boost   08/23/2014 -  Anti-estrogen oral therapy   Tamoxifen 20 mg daily     CURRENT THERAPY: observation  INTERVAL HISTORY: Meagan Wallace 76 y.o. female returns for f/u of right sided breast infection.  She was seen by her PCP this past Monday for breast cellulitis.  She started Doxycycline yesterday for this.  She is concerned that her breast is looking worse today.  She has not undergone screening mammogram in the past 2 to 3 years.   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  MEDICAL HISTORY: Past Medical History:  Diagnosis Date   Allergy    Anemia    Anxiety    Arthritis    hands, knees   Asthma    Atrial fibrillation ([place])    Breast cancer (HCC)    right breast   Cataract    bilateral- surgery to remove   Depression    Diabetes mellitus without complication (HCC)    Heart murmur    never has caused any problems per patient   Hyperlipidemia    Hypertension    Personal history of radiation therapy    PONV (postoperative nausea and vomiting)    either   Radiation 02/02/14-03/17/14   Right Breast   Stroke [facility])    at age 52 yrs old, no problems since  per patient    SURGICAL HISTORY: Past Surgical History:  Procedure Laterality Date   APPENDECTOMY  1987   BIOPSY  06/12/2021   Procedure: BIOPSY;  Surgeon: Otis Brace, MD;  [facility];  Service: Gastroenterology;;   BREAST BIOPSY Right    BREAST LUMPECTOMY     right breast 2013/12/29   BREAST LUMPECTOMY WITH RADIOACTIVE SEED LOCALIZATION Right 12/27/2013   Procedure: BREAST LUMPECTOMY WITH RADIOACTIVE SEED LOCALIZATION;  Surgeon: Joyice Faster. Cornett, MD;  Location: [place];  Service: General;  Laterality: Right;   COLONOSCOPY  10/27/2017   polyps/Danis   COLONOSCOPY WITH PROPOFOL N/A 06/12/2021   Procedure: COLONOSCOPY WITH PROPOFOL;  Surgeon: Otis Brace, MD;  [facility];  Service: Gastroenterology;  Laterality: N/A;   ESOPHAGOGASTRODUODENOSCOPY (EGD) WITH PROPOFOL N/A 06/12/2021   Procedure: ESOPHAGOGASTRODUODENOSCOPY (EGD) WITH PROPOFOL;  Surgeon: Otis Brace, MD;  [facility];  Service: Gastroenterology;  Laterality: N/A;   EXPLORATORY LAPAROTOMY  1987   EYE SURGERY Bilateral    cataracts removed   POLYPECTOMY  06/12/2021   Procedure: POLYPECTOMY;  Surgeon: Otis Brace, MD;  [facility];  Service: Gastroenterology;;   SHOULDER SURGERY  12/29/2009   right    SOCIAL HISTORY: Social History   Socioeconomic History   Marital status: Widowed    Spouse name: Not on file   Number of children: 0   Years of education: Not on file   Highest education level: Not on file  Occupational History   Not on file  Tobacco Use   Smoking status: Never   Smokeless tobacco: Never  Vaping Use   Vaping Use: Never used  Substance and Sexual Activity   Alcohol use: No   Drug use: No   Sexual activity: Not Currently    Birth control/protection: Post-menopausal  Other Topics Concern   Not on file  Social History Narrative   Husband passed away in 12-30-2014   Social Determinants of Health   Financial Resource Strain: Not on file  Food Insecurity: Not on file  Transportation Needs: Not on file  Physical Activity: Not on file  Stress: Not on file  Social Connections: Not on file  Intimate Partner Violence: Not on file    FAMILY HISTORY: Family History  Problem Relation Age of Onset   Heart disease Mother    Stroke Father    Breast cancer Paternal Aunt    Colon cancer Neg Hx    Esophageal cancer Neg Hx    Rectal cancer Neg Hx    Stomach cancer Neg Hx     Review of Systems  Constitutional:   Negative for appetite change, chills, fatigue, fever and unexpected weight change.  HENT:   Negative for hearing loss, lump/mass and trouble swallowing.   Eyes:  Negative for eye problems and icterus.  Respiratory:  Negative for chest tightness, cough and shortness of breath.   Cardiovascular:  Negative for chest pain, leg swelling and palpitations.  Gastrointestinal:  Negative for abdominal distention, abdominal pain, constipation, diarrhea, nausea and vomiting.  Endocrine: Negative for hot flashes.  Genitourinary:  Negative for difficulty urinating.   Musculoskeletal:  Negative for arthralgias.  Skin:  Negative for itching and rash.  Neurological:  Negative for dizziness, extremity weakness, headaches and numbness.  Hematological:  Negative for adenopathy. Does not bruise/bleed easily.  Psychiatric/Behavioral:  Negative for depression. The patient is not nervous/anxious.       PHYSICAL EXAMINATION  ECOG PERFORMANCE STATUS: 1 - Symptomatic but completely ambulatory  Vitals:  06/20/22 1435  BP: (!) 158/76  Pulse: 91  Resp: 18  Temp: 97.9 F (36.6 C)  SpO2: 91%    Physical Exam Constitutional:      General: She is not in acute distress.    Appearance: Normal appearance. She is not toxic-appearing.  HENT:     Head: Normocephalic and atraumatic.  Eyes:     General: No scleral icterus. Cardiovascular:     Rate and Rhythm: Normal rate and regular rhythm.     Pulses: Normal pulses.     Heart sounds: Normal heart sounds.  Pulmonary:     Effort: Pulmonary effort is normal.     Breath sounds: Normal breath sounds.  Abdominal:     General: Abdomen is flat. Bowel sounds are normal. There is no distension.     Palpations: Abdomen is soft.     Tenderness: There is no abdominal tenderness.  Musculoskeletal:        General: No swelling.     Cervical back: Neck supple.  Lymphadenopathy:     Cervical: No cervical adenopathy.  Skin:    General: Skin is warm and dry.      Findings: No rash.  Neurological:     General: No focal deficit present.     Mental Status: She is alert.  Psychiatric:        Mood and Affect: Mood normal.        Behavior: Behavior normal.   Right Breast 06/20/2022, area is not fluctuant, mass noted in right central upper breast extending to the dimpled area that is adjacent to areola   LABORATORY DATA: None for this visit  ASSESSMENT and THERAPY PLAN:   Ductal carcinoma in situ (DCIS) of right breast Meagan Wallace is a 76 year old woman who is here today for follow-up of her right-sided breast erythema, swelling, and mass.  She has a history of breast cancer diagnosed in 2015 that was DCIS and underwent lumpectomy, radiation, and completed 5 years of antiestrogen therapy in 2020.  She was placed on antibiotics by her PCP with doxycycline.  Since the erythema has worsened since starting the doxycycline and having taken 3 doses I sent in Augmentin for her to start tomorrow if the erythema did not improve.  She and her family member verbalized understanding of this.  I am concerned with the mass in the area being nonfluctuant that we are dealing with breast cancer recurrence.  I have asked the breast center that we work diligently to move up her appointment for mammogram ultrasound and potentially breast biopsy.  We will await mammogram, ultrasound, and possibly biopsy and then plan next steps after that.  I did send Dr. Brantley Stage a message to see if he could get her in this week for follow-up as well.  She has follow-up on September 14 with Dr. Lindi Adie.  She knows to call for any questions in the meantime.   All questions were answered. The patient knows to call the clinic with any problems, questions or concerns. We can certainly see the patient much sooner if necessary.  Total encounter time:40 minutes*in face-to-face visit time, chart review, lab review, care coordination, order entry, and documentation of the encounter time.    Wilber Bihari, NP 06/24/22 1:31 PM Medical Oncology and Hematology [facility] [place], [place] [address] Tel. [phone]    Fax. [phone]  *Total Encounter Time as defined by the Centers for Medicare and Medicaid Services includes, in addition to the face-to-face time of  a patient visit (documented in the note above) non-face-to-face time: obtaining and reviewing outside history, ordering and reviewing medications, tests or procedures, care coordination (communications with other health care professionals or caregivers) and documentation in the medical record.

## 2022-06-20 NOTE — Telephone Encounter (Signed)
Contacted patient at request of Wilber Bihari, NP to inform that appts for diagnostic mammogram and breast US originally on Tuesday have been moved up a day to Monday. Spoke with patient's sister Mechele Claude and gave information about appt times. JoAnne verbalized understanding.

## 2022-06-24 ENCOUNTER — Encounter: Payer: Self-pay | Admitting: Adult Health

## 2022-06-24 ENCOUNTER — Ambulatory Visit
Admission: RE | Admit: 2022-06-24 | Discharge: 2022-06-24 | Disposition: A | Discharge status: Home / Self Care | Payer: Medicare Other | Source: Ambulatory Visit | Attending: Hematology and Oncology | Admitting: Hematology and Oncology

## 2022-06-24 DIAGNOSIS — D0511 Intraductal carcinoma in situ of right breast: Secondary | ICD-10-CM

## 2022-06-24 NOTE — Progress Notes (Signed)
Patient Care Team: Cher Nakai, MD as PCP - General (Internal Medicine) Stanford Breed Denice Bors, MD as PCP - Cardiology (Cardiology)  DIAGNOSIS:  Encounter Diagnosis  Name Primary?   Ductal carcinoma in situ (DCIS) of right breast     SUMMARY OF ONCOLOGIC HISTORY: Oncology History  Ductal carcinoma in situ (DCIS) of right breast  11/11/2013 Initial Diagnosis   Ductal carcinoma in situ (DCIS) of right breast diagnosed at [facility], 1.9 cm mass at 12:00, DCIS high-grade ER/PR negative, MRI revealed 2.6 cm mass along with bilateral enhancements (fibroadenomas on biopsy)   12/27/2013 Surgery   Right lumpectomy with Dr. Brantley Stage: Intermediate grade DCIS ER 90%, PR 0%, [facility]   02/02/2014 - 03/17/2014 Radiation Therapy   Adjuvant radiation therapy by Dr. Pablo Ledger 50 Gy to right breast with 10 Gy boost   08/23/2014 -  Anti-estrogen oral therapy   Tamoxifen 20 mg daily     CHIEF COMPLIANT: Follow-up on breast redness  INTERVAL HISTORY: Meagan Wallace is a 76 y.o. with above-mentioned history of right breast DCIS treated with lumpectomy, radiation. She presents to the clinic today for a follow-up. She notice the redness of breast last Sunday when she was taking her bath. She states that it has spread but denies any fevers or chills    . Complains of hip pain knee pain states pain all over.  Diffuse body aches and pains.    ALLERGIES:  has No Known Allergies.  MEDICATIONS:  Current Outpatient Medications  Medication Sig Dispense Refill   acetaminophen (TYLENOL) 500 MG tablet Take 500 mg by mouth every 6 (six) hours as needed for moderate pain.     albuterol (PROVENTIL) (2.5 MG/3ML) 0.083% nebulizer solution Take 2.5 mg by nebulization every 6 (six) hours as needed for wheezing or shortness of breath.     amoxicillin-clavulanate (AUGMENTIN) 875-125 MG tablet Take 1 tablet by mouth 2 (two) times daily. 28 tablet 0   apixaban (ELIQUIS) 5 MG TABS tablet Take 1 tablet (5 mg total) by  mouth 2 (two) times daily. 60 tablet 0   diltiazem (CARDIZEM CD) 120 MG 24 hr capsule Take 1 capsule (120 mg total) by mouth daily. 30 capsule 0   doxycycline (VIBRAMYCIN) 100 MG capsule Take 100 mg by mouth 1 day or 1 dose.     furosemide (LASIX) 20 MG tablet Take 20 mg by mouth 2 (two) times daily.     glimepiride (AMARYL) 4 MG tablet Take 4 mg by mouth daily with breakfast.     metFORMIN (GLUCOPHAGE) 500 MG tablet Take 2 tablets (1,000 mg total) by mouth 2 (two) times daily with a meal. (Patient taking differently: Take 1,000 mg by mouth daily with breakfast.)     nystatin (MYCOSTATIN/NYSTOP) powder Apply 1 application topically as needed for other. Apply under both breasts for sweating/ moisture     pravastatin (PRAVACHOL) 10 MG tablet Take 10 mg by mouth at bedtime.     ramipril (ALTACE) 10 MG capsule Take 10 mg by mouth daily.     Semaglutide,0.25 or 0.'5MG'$ /DOS, (OZEMPIC, 0.25 OR 0.5 MG/DOSE,) 2 MG/1.5ML SOPN Inject 0.5 mg into the skin every Thursday.     venlafaxine XR (EFFEXOR-XR) 150 MG 24 hr capsule Take 150 mg by mouth daily with breakfast.     No current facility-administered medications for this visit.    PHYSICAL EXAMINATION: ECOG PERFORMANCE STATUS: 1 - Symptomatic but completely ambulatory  Vitals:   06/27/22 1055  BP: (!) 141/87  Pulse: 88  Resp:  18  Temp: 97.9 F (36.6 C)  SpO2: 97%   Filed Weights   06/27/22 1055  Weight: 248 lb 1.6 oz (112.5 kg)    BREAST: Cellulitis of the right breast with firm area and redness    LABORATORY DATA:  I have reviewed the data as listed    Latest Ref Rng & Units 01/25/2022   12:38 PM 11/23/2021    2:25 AM 11/22/2021    9:59 AM  CMP  Glucose 70 - 99 mg/dL 117  135  181   BUN 8 - 27 mg/dL '21  16  20   '$ Creatinine 0.57 - 1.00 mg/dL 0.90  1.18  1.04   Sodium 134 - 144 mmol/L 141  140  136   Potassium 3.5 - 5.2 mmol/L 4.0  3.2  3.4   Chloride 96 - 106 mmol/L 102  104  103   CO2 20 - 29 mmol/L '23  25  24   '$ Calcium 8.7 - 10.3  mg/dL 10.4  8.9  8.5   Total Protein 6.5 - 8.1 g/dL  5.8  5.6   Total Bilirubin 0.3 - 1.2 mg/dL  0.4  0.5   Alkaline Phos 38 - 126 U/L  110  112   AST 15 - 41 U/L  13  11   ALT 0 - 44 U/L  14  12     Lab Results  Component Value Date   WBC 14.1 (H) 01/25/2022   HGB 11.5 01/25/2022   HCT 36.0 01/25/2022   MCV 82 01/25/2022   PLT 236 01/25/2022   NEUTROABS 6.4 11/23/2021    ASSESSMENT & PLAN:  Ductal carcinoma in situ (DCIS) of right breast 11/11/2013: Right breast DCIS intermediate grade, ER 90%, PR 0% (on initial biopsy she was started to high-grade DCIS that was ER/PR negative); status post lumpectomy, radiation and is currently on tamoxifen since November 2015 Patient saw Dr. Humphrey Rolls and Dr. Marko Plume previously Adjuvant tamoxifen completed November 2020  Breast cancer surveillance: 1. Mammograms   and ultrasound 06/24/2022: Clinically improving mastitis.  Residual 4 to 5 cm skin mass right breast suspected necrosis or abscess beneath the skin at 1:30 position 2. Breast exam  06/27/2022: Serial dose with the redness and tenderness in the right breast   Osteopenia Morbid obesity: . 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  I will discuss with Dr. Brantley Stage to see what is the best way to treat her.  She is diabetic and therefore she might require hospitalization and IV antibiotics to get better.  Today she will switch from doxycycline to Augmentin.    No orders of the defined types were placed in this encounter.  The patient has a good understanding of the overall plan. she agrees with it. she will call with any problems that may develop before the next visit here. Total time spent: 30 mins including face to face time and time spent for planning, charting and co-ordination of care   Harriette Ohara, MD 06/27/22    I Gardiner Coins am scribing for Dr. Lindi Adie  I have reviewed the above documentation  for accuracy and completeness, and I agree with the above.

## 2022-06-24 NOTE — Assessment & Plan Note (Addendum)
Meagan Wallace is a 76 year old woman who is here today for follow-up of her right-sided breast erythema, swelling, and mass.  She has a history of breast cancer diagnosed in 2015 that was DCIS and underwent lumpectomy, radiation, and completed 5 years of antiestrogen therapy in 2020.  She was placed on antibiotics by her PCP with doxycycline.  Since the erythema has worsened since starting the doxycycline and having taken 3 doses I sent in Augmentin for her to start tomorrow if the erythema did not improve.  She and her family member verbalized understanding of this.  I am concerned with the mass in the area being nonfluctuant that we are dealing with breast cancer recurrence.  I have asked the breast center that we work diligently to move up her appointment for mammogram ultrasound and potentially breast biopsy.  We will await mammogram, ultrasound, and possibly biopsy and then plan next steps after that.  I did send Meagan Wallace a message to see if he could get her in this week for follow-up as well.  She has follow-up on September 14 with Meagan Wallace.  She knows to call for any questions in the meantime.

## 2022-06-25 ENCOUNTER — Other Ambulatory Visit: Payer: Medicare Other

## 2022-06-27 ENCOUNTER — Other Ambulatory Visit: Payer: Self-pay

## 2022-06-27 ENCOUNTER — Inpatient Hospital Stay (HOSPITAL_BASED_OUTPATIENT_CLINIC_OR_DEPARTMENT_OTHER): Payer: Medicare Other | Admitting: Hematology and Oncology

## 2022-06-27 DIAGNOSIS — D0511 Intraductal carcinoma in situ of right breast: Secondary | ICD-10-CM | POA: Diagnosis not present

## 2022-06-27 NOTE — Assessment & Plan Note (Addendum)
11/11/2013: Right breast DCIS intermediate grade, ER 90%, PR 0%(on initial biopsy she was started to high-grade DCIS that was ER/PR negative);status post lumpectomy, radiation and is currently on tamoxifen since November 2015 Patient saw Dr. Humphrey Rolls and Dr. Marko Plume previously Adjuvant tamoxifen completed November 2020  Breast cancer surveillance: 1.Mammograms  and ultrasound 06/24/2022: Clinically improving mastitis.  Residual 4 to 5 cm skin mass right breast suspected necrosis or abscess beneath the skin at 1:30 position 2.Breast exam  06/27/2022: Serial dose with the redness and tenderness in the right breast  Osteopenia Morbid obesity: . 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  I will discuss with Dr. Brantley Stage to see what is the best way to treat her.  She is diabetic and therefore she might require hospitalization and IV antibiotics to get better.  Today she will switch from doxycycline to Augmentin.

## 2022-06-28 ENCOUNTER — Other Ambulatory Visit: Payer: Self-pay | Admitting: Surgery

## 2022-07-22 ENCOUNTER — Ambulatory Visit: Payer: Self-pay | Admitting: Surgery

## 2022-07-26 ENCOUNTER — Telehealth: Payer: Self-pay | Admitting: *Deleted

## 2022-07-26 NOTE — Telephone Encounter (Signed)
   Pre-operative Risk Assessment    Patient Name: Meagan Wallace  DOB: [birth year] MRN: [id number]      Request for Surgical Clearance    Procedure:   LUMPECTOMY   Date of Surgery:  Clearance TBD                                 Surgeon:  DR. Erroll Luna Surgeon's Group or Practice Name:  [facility] Phone number:  [phone] Fax number:  [phone] ATTN: Carlene Coria, CMA   Type of Clearance Requested:   - Medical  - Pharmacy:  Hold Apixaban (Eliquis)     Type of Anesthesia:  General    Additional requests/questions:    Jiles Prows   07/26/2022, 4:19 PM

## 2022-07-31 ENCOUNTER — Telehealth: Payer: Self-pay

## 2022-07-31 NOTE — Telephone Encounter (Signed)
  Patient Consent for Virtual Visit        Meagan Wallace has provided verbal consent on 07/31/2022 for a virtual visit (video or telephone).   CONSENT FOR VIRTUAL VISIT FOR:  Meagan Wallace  By participating in this virtual visit I agree to the following:  I hereby voluntarily request, consent and authorize [facility] and its employed or contracted physicians, physician assistants, nurse practitioners or other licensed health care professionals (the Practitioner), to provide me with telemedicine health care services (the "Services") as deemed necessary by the treating Practitioner. I acknowledge and consent to receive the Services by the Practitioner via telemedicine. I understand that the telemedicine visit will involve communicating with the Practitioner through live audiovisual communication technology and the disclosure of certain medical information by electronic transmission. I acknowledge that I have been given the opportunity to request an in-person assessment or other available alternative prior to the telemedicine visit and am voluntarily participating in the telemedicine visit.  I understand that I have the right to withhold or withdraw my consent to the use of telemedicine in the course of my care at any time, without affecting my right to future care or treatment, and that the Practitioner or I may terminate the telemedicine visit at any time. I understand that I have the right to inspect all information obtained and/or recorded in the course of the telemedicine visit and may receive copies of available information for a reasonable fee.  I understand that some of the potential risks of receiving the Services via telemedicine include:  Delay or interruption in medical evaluation Wallace to technological equipment failure or disruption; Information transmitted may not be sufficient (e.g. poor resolution of images) to allow for appropriate medical decision making by the  Practitioner; and/or  In rare instances, security protocols could fail, causing a breach of personal health information.  Furthermore, I acknowledge that it is my responsibility to provide information about my medical history, conditions and care that is complete and accurate to the best of my ability. I acknowledge that Practitioner's advice, recommendations, and/or decision may be based on factors not within their control, such as incomplete or inaccurate data provided by me or distortions of diagnostic images or specimens that may result from electronic transmissions. I understand that the practice of medicine is not an exact science and that Practitioner makes no warranties or guarantees regarding treatment outcomes. I acknowledge that a copy of this consent can be made available to me via my patient portal ([facility]), or I can request a printed copy by calling the office of [facility].    I understand that my insurance will be billed for this visit.   I have read or had this consent read to me. I understand the contents of this consent, which adequately explains the benefits and risks of the Services being provided via telemedicine.  I have been provided ample opportunity to ask questions regarding this consent and the Services and have had my questions answered to my satisfaction. I give my informed consent for the services to be provided through the use of telemedicine in my medical care

## 2022-07-31 NOTE — Telephone Encounter (Signed)
Pt is scheduled for Tele-visit for preop clearance on 08/01/22.

## 2022-07-31 NOTE — Telephone Encounter (Signed)
   Name: Meagan Wallace  DOB: 1946-07-28  MRN: [id number]  Primary Cardiologist: Kirk Ruths, MD   Preoperative team, please contact this patient and set up a phone call appointment for further preoperative risk assessment. Please obtain consent and complete medication review. Thank you for your help.  I confirm that guidance regarding antiplatelet and oral anticoagulation therapy has been completed and, if necessary, noted below.  Per Dr. Stanford Breed, primary cardiologist, patient may hold Eliquis for 2 days prior to procedure.  Please resume Eliquis as soon as possible postprocedure, at the discretion of the surgeon.  Lenna Sciara, NP 07/31/2022, 11:38 AM [place]

## 2022-07-31 NOTE — Telephone Encounter (Signed)
Patient with diagnosis of atrial fibrillation on Eliquis for anticoagulation.    Procedure: lumpectomy Date of procedure: TBD   CHA2DS2-VASc Score = 7   This indicates a 11.2% annual risk of stroke. The patient's score is based upon: CHF History: 0 HTN History: 1 Diabetes History: 1 Stroke History: 2 Vascular Disease History: 0 Age Score: 2 Gender Score: 1    CrCl 65 (with adjusted body weight) Platelet count 236  Chart indicates stroke occurred at age 76, no problems since  Will confirm with primary cardiologist if okay to hold Eliquis x 2 days without bridging.    **This guidance is not considered finalized until pre-operative APP has relayed final recommendations.**

## 2022-08-01 ENCOUNTER — Ambulatory Visit: Payer: Medicare Other | Attending: Cardiology | Admitting: General Practice

## 2022-08-01 DIAGNOSIS — Z01810 Encounter for preprocedural cardiovascular examination: Secondary | ICD-10-CM | POA: Diagnosis not present

## 2022-08-01 NOTE — Progress Notes (Signed)
Virtual Visit via Telephone Note   Because of Meagan Wallace's co-morbid illnesses, she is at least at moderate risk for complications without adequate follow up.  This format is felt to be most appropriate for this patient at this time.  The patient did not have access to video technology/had technical difficulties with video requiring transitioning to audio format only (telephone).  All issues noted in this document were discussed and addressed.  No physical exam could be performed with this format.  Please refer to the patient's chart for her consent to telehealth for [facility].  Evaluation Performed:  Preoperative cardiovascular risk assessment _____________   Date:  08/01/2022   Patient ID:  Meagan Wallace, DOB [birth year], MRN [id number] Patient Location:  Home Provider location:   Office  Primary Care Provider:  Cher Nakai, MD Primary Cardiologist:  Kirk Ruths, MD  Chief Complaint / Patient Profile   76 y.o. y/o female with a h/o HTN, PAF, hyperlipidemia who is pending Lumpectomy and presents today for telephonic preoperative cardiovascular risk assessment.  Past Medical History    Past Medical History:  Diagnosis Date   Allergy    Anemia    Anxiety    Arthritis    hands, knees   Asthma    Atrial fibrillation ([facility])    Breast cancer (HCC)    right breast   Cataract    bilateral- surgery to remove   Depression    Diabetes mellitus without complication (HCC)    Heart murmur    never has caused any problems per patient   Hyperlipidemia    Hypertension    Personal history of radiation therapy    PONV (postoperative nausea and vomiting)    either   Radiation 02/02/14-03/17/14   Right Breast   Stroke [facility])    at age 58 yrs old, no problems since per patient   Past Surgical History:  Procedure Laterality Date   APPENDECTOMY  1987   BIOPSY  06/12/2021   Procedure: BIOPSY;  Surgeon: Otis Brace, MD;  Location: [facility];  Service:  Gastroenterology;;   BREAST BIOPSY Right    BREAST LUMPECTOMY     right breast 2015   BREAST LUMPECTOMY WITH RADIOACTIVE SEED LOCALIZATION Right 12/27/2013   Procedure: BREAST LUMPECTOMY WITH RADIOACTIVE SEED LOCALIZATION;  Surgeon: Marcello Moores A. Cornett, MD;  Location: [facility];  Service: General;  Laterality: Right;   COLONOSCOPY  10/27/2017   polyps/[facility]   COLONOSCOPY WITH PROPOFOL N/A 06/12/2021   Procedure: COLONOSCOPY WITH PROPOFOL;  Surgeon: Otis Brace, MD;  Location: [facility];  Service: Gastroenterology;  Laterality: N/A;   ESOPHAGOGASTRODUODENOSCOPY (EGD) WITH PROPOFOL N/A 06/12/2021   Procedure: ESOPHAGOGASTRODUODENOSCOPY (EGD) WITH PROPOFOL;  Surgeon: Otis Brace, MD;  Location: [facility];  Service: Gastroenterology;  Laterality: N/A;   EXPLORATORY LAPAROTOMY  1987   EYE SURGERY Bilateral    cataracts removed   POLYPECTOMY  06/12/2021   Procedure: POLYPECTOMY;  Surgeon: Otis Brace, MD;  Location: [facility];  Service: Gastroenterology;;   SHOULDER SURGERY  2011   right    Allergies  No Known Allergies  History of Present Illness    Meagan Wallace is a 76 y.o. female who presents via audio/video conferencing for a telehealth visit today.  Pt was last seen in cardiology clinic on 05/27/2022 by Dr. Stanford Breed.  At that time Meagan Wallace was doing well .  The patient is now pending procedure as outlined above. Since her last visit, she remained stable from  a cardiac standpoint.  Today she denies chest pain, shortness of breath, increased lower extremity edema, fatigue, palpitations, melena, hematuria, hemoptysis, diaphoresis, weakness, presyncope, syncope, orthopnea, and PND.    Home Medications    Prior to Admission medications   Medication Sig Start Date End Date Taking? Authorizing Provider  acetaminophen (TYLENOL) 500 MG tablet Take 500 mg by mouth every 6 (six) hours as needed for moderate pain.    [provider]  albuterol (PROVENTIL) (2.5 MG/3ML) 0.083% nebulizer solution Take 2.5 mg by nebulization every 6 (six) hours as needed for wheezing or shortness of breath.    [provider]  apixaban (ELIQUIS) 5 MG TABS tablet Take 1 tablet (5 mg total) by mouth 2 (two) times daily. 11/23/21   Raiford Noble Latif, DO  diltiazem (CARDIZEM CD) 120 MG 24 hr capsule Take 1 capsule (120 mg total) by mouth daily. 11/24/21   Raiford Noble Latif, DO  furosemide (LASIX) 20 MG tablet Take 20 mg by mouth 2 (two) times daily. 09/05/18   [provider]  glimepiride (AMARYL) 4 MG tablet Take 4 mg by mouth daily with breakfast.    [provider]  metFORMIN (GLUCOPHAGE) 500 MG tablet Take 2 tablets (1,000 mg total) by mouth 2 (two) times daily with a meal. Patient taking differently: Take 1,000 mg by mouth daily with breakfast. 07/01/19   Nicholas Lose, MD  nystatin (MYCOSTATIN/NYSTOP) powder Apply 1 application topically as needed for other. Apply under both breasts for sweating/ moisture    [provider]  pravastatin (PRAVACHOL) 10 MG tablet Take 10 mg by mouth at bedtime.    [provider]  ramipril (ALTACE) 10 MG capsule Take 10 mg by mouth daily.    [provider]  venlafaxine XR (EFFEXOR-XR) 150 MG 24 hr capsule Take 150 mg by mouth daily with breakfast. 01/04/16   [provider]    Physical Exam    Vital Signs:  Meagan Wallace does not have vital signs available for review today.  Given telephonic nature of communication, physical exam is limited. AAOx3. NAD. Normal affect.  Speech and respirations are unlabored.  Accessory Clinical Findings    None  Assessment & Plan    1.  Preoperative Cardiovascular Risk Assessment: Lumpectomy, TBD, Dr. Erroll Luna, [facility]      Primary Cardiologist: Kirk Ruths, MD  Chart reviewed as part of pre-operative protocol coverage. Given past medical history and time since last visit,  based on ACC/AHA guidelines, Meagan Wallace would be at acceptable risk for the planned procedure without further cardiovascular testing.   Patient was advised that if she develops new symptoms prior to surgery to contact our office to arrange a follow-up appointment.  She verbalized understanding.  I will route this recommendation to the requesting party via Epic fax function and remove from pre-op pool.   Her Eliquis may be held for 2 days prior to her procedure.  Please resume as soon as hemostasis is achieved at the discretion of the surgeon.  Patient with diagnosis of atrial fibrillation on Eliquis for anticoagulation.     Procedure: lumpectomy Date of procedure: TBD     CHA2DS2-VASc Score = 7   This indicates a 11.2% annual risk of stroke. The patient's score is based upon: CHF History: 0 HTN History: 1 Diabetes History: 1 Stroke History: 2 Vascular Disease History: 0 Age Score: 2 Gender Score: 1     CrCl 65 (with adjusted body weight) Platelet count 236  Chart indicates stroke occurred at age 56, no problems since    Time:   Today, I have spent 14 minutes with the patient with telehealth technology discussing medical history, symptoms, and management plan.  Prior to her phone evaluation I spent greater than 10 minutes reviewing past medical history and cardiac medications.   Deberah Pelton, NP  08/01/2022, 7:43 AM

## 2022-08-16 ENCOUNTER — Telehealth: Payer: Self-pay

## 2022-08-16 NOTE — Telephone Encounter (Signed)
Pre-operative Risk Assessment    Patient Name: Meagan Wallace  DOB: 11-19-1945 MRN: [id number]      Request for Surgical Clearance     Procedure:   LUMPECTOMY  Date of Surgery:  Clearance TBD                                 Surgeon:  DR Luisa Hart Surgeon's Group or Practice Name:  [facility] Phone number:  [phone] Fax number:  [phone]   Type of Clearance Requested:   - Pharmacy:  Hold Apixaban (Eliquis) WE NEED INSTRUCTIONS AS TO HOW THE PATIENT SHOULD HOLD MEDICATION PREOPERATIVELY    Type of Anesthesia:  General    Additional requests/questions:  Please fax a copy of CARDIAC CLEARANCE to the surgeon's office.  Signed, Tera Helper Ahmarion Saraceno CCMA  08/16/2022, 5:14 PM

## 2022-08-19 NOTE — Telephone Encounter (Signed)
     Primary Cardiologist: Kirk Ruths, MD  Chart reviewed as part of pre-operative protocol coverage. Given past medical history and time since last visit, based on ACC/AHA guidelines, Meagan Wallace would be at acceptable risk for the planned procedure without further cardiovascular testing.   Patient was advised that if she develops new symptoms prior to surgery to contact our office to arrange a follow-up appointment.  She verbalized understanding.  Patient with diagnosis of PAF(paroxysmal atrial fibrillation) on Eliquis for anticoagulation.     Procedure: Lumpectomy Date of procedure: TBD     CHA2DS2-VASc Score = 7   This indicates a 11.2% annual risk of stroke. The patient's score is based upon: CHF History: 0 HTN History: 1 Diabetes History: 1 Stroke History: 2 Vascular Disease History: 0 Age Score: 2 Gender Score: 1   CrCl 66 mL/min (Adj BW) (based on SrCr 0.90 on 01/25/2022) Platelet count 236 (01/25/2022)   Patient has been previously cleared by Dr. Stanford Breed to hold Eliquis for 2 days with out bridging.  Patient can hold Eliquis for 2 days without bridging. Please resume Eliquis as soon as possible postprocedure, at the discretion of the surgeon.   I will route this recommendation to the requesting party via Epic fax function and remove from pre-op pool.  Please call with questions.  Meagan Ng. Arionne Iams NP-C     08/19/2022, 4:30 PM [address] Office [phone] Fax [phone]

## 2022-08-19 NOTE — Telephone Encounter (Signed)
Patient with diagnosis of PAF(paroxysmal atrial fibrillation) on Eliquis for anticoagulation.    Procedure: Lumpectomy Date of procedure: TBD   CHA2DS2-VASc Score = 7   This indicates a 11.2% annual risk of stroke. The patient's score is based upon: CHF History: 0 HTN History: 1 Diabetes History: 1 Stroke History: 2 Vascular Disease History: 0 Age Score: 2 Gender Score: 1   CrCl 66 mL/min (Adj BW) (based on SrCr 0.90 on 01/25/2022) Platelet count 236 (01/25/2022)  Patient has been previously cleared by Dr. Stanford Breed to hold Eliquis for 2 days with out bridging.  Patient can hold Eliquis for 2 days without bridging. Please resume Eliquis as soon as possible postprocedure, at the discretion of the surgeon.   **This guidance is not considered finalized until pre-operative APP has relayed final recommendations.**

## 2022-09-03 ENCOUNTER — Other Ambulatory Visit: Payer: Self-pay

## 2022-09-03 ENCOUNTER — Encounter (HOSPITAL_BASED_OUTPATIENT_CLINIC_OR_DEPARTMENT_OTHER): Payer: Self-pay | Admitting: Surgery

## 2022-09-03 NOTE — Progress Notes (Signed)
   09/03/22 0915  PAT Phone Screen  Is the patient taking a GLP-1 receptor agonist? No  Do You Have Diabetes? Yes  Do You Have Hypertension? Yes  Have You Ever Been to the ER for Asthma? No  Have You Taken Oral Steroids in the Past 3 Months? No  Do you Take Phenteramine or any Other Diet Drugs? No  Recent  Lab Work, EKG, CXR? Yes (see EPIC for ECHO and EKG)  Where was this test performed? [facility]  Do you have a history of heart problems? Yes  Cardiologist Name Dr. Kirk Ruths  Have you ever had tests on your heart? Yes  What cardiac tests were performed? Echo;EKG  What date/year were cardiac tests completed? 2023  Results viewable: Paper Copy in Chart;[facility] Media Tab  Any Recent Hospitalizations? No  Height '5\' 5"'$  (1.651 m)  Weight 108.9 kg  Pat Appointment Scheduled Yes (BMP and ERAS)

## 2022-09-10 ENCOUNTER — Encounter (HOSPITAL_BASED_OUTPATIENT_CLINIC_OR_DEPARTMENT_OTHER)
Admission: RE | Admit: 2022-09-10 | Discharge: 2022-09-10 | Disposition: A | Discharge status: Home / Self Care | Payer: Medicare Other | Source: Ambulatory Visit | Attending: Surgery | Admitting: Surgery

## 2022-09-10 DIAGNOSIS — E1122 Type 2 diabetes mellitus with diabetic chronic kidney disease: Secondary | ICD-10-CM | POA: Diagnosis not present

## 2022-09-10 DIAGNOSIS — I129 Hypertensive chronic kidney disease with stage 1 through stage 4 chronic kidney disease, or unspecified chronic kidney disease: Secondary | ICD-10-CM | POA: Diagnosis not present

## 2022-09-10 DIAGNOSIS — Z7901 Long term (current) use of anticoagulants: Secondary | ICD-10-CM | POA: Diagnosis not present

## 2022-09-10 DIAGNOSIS — E669 Obesity, unspecified: Secondary | ICD-10-CM | POA: Diagnosis not present

## 2022-09-10 DIAGNOSIS — Z7984 Long term (current) use of oral hypoglycemic drugs: Secondary | ICD-10-CM | POA: Diagnosis not present

## 2022-09-10 DIAGNOSIS — C50811 Malignant neoplasm of overlapping sites of right female breast: Secondary | ICD-10-CM | POA: Diagnosis not present

## 2022-09-10 DIAGNOSIS — I4891 Unspecified atrial fibrillation: Secondary | ICD-10-CM | POA: Diagnosis not present

## 2022-09-10 DIAGNOSIS — Z6838 Body mass index (BMI) 38.0-38.9, adult: Secondary | ICD-10-CM | POA: Diagnosis not present

## 2022-09-10 DIAGNOSIS — N189 Chronic kidney disease, unspecified: Secondary | ICD-10-CM | POA: Diagnosis not present

## 2022-09-10 DIAGNOSIS — N6310 Unspecified lump in the right breast, unspecified quadrant: Secondary | ICD-10-CM | POA: Diagnosis present

## 2022-09-10 DIAGNOSIS — J45909 Unspecified asthma, uncomplicated: Secondary | ICD-10-CM | POA: Diagnosis not present

## 2022-09-10 DIAGNOSIS — Z8673 Personal history of transient ischemic attack (TIA), and cerebral infarction without residual deficits: Secondary | ICD-10-CM | POA: Diagnosis not present

## 2022-09-10 LAB — BASIC METABOLIC PANEL
Anion gap: 11 (ref 5–15)
BUN: 25 mg/dL — ABNORMAL HIGH (ref 8–23)
CO2: 27 mmol/L (ref 22–32)
Calcium: 10.4 mg/dL — ABNORMAL HIGH (ref 8.9–10.3)
Chloride: 103 mmol/L (ref 98–111)
Creatinine, Ser: 1.12 mg/dL — ABNORMAL HIGH (ref 0.44–1.00)
GFR, Estimated: 51 mL/min — ABNORMAL LOW (ref >60)
Glucose, Bld: 113 mg/dL — ABNORMAL HIGH (ref 70–99)
Potassium: 3.8 mmol/L (ref 3.5–5.1)
Sodium: 141 mmol/L (ref 135–145)

## 2022-09-10 NOTE — Anesthesia Preprocedure Evaluation (Addendum)
Anesthesia Evaluation    Reviewed: Allergy & Precautions, Patient's Chart, lab work & pertinent test results  History of Anesthesia Complications (+) PONV and history of anesthetic complications  Airway Mallampati: II  TM Distance: >3 FB Neck ROM: Full    Dental no notable dental hx. (+) Poor Dentition, Dental Advisory Given, Missing,    Pulmonary asthma    Pulmonary exam normal breath sounds clear to auscultation       Cardiovascular hypertension, Pt. on medications Normal cardiovascular exam+ dysrhythmias Atrial Fibrillation  Rhythm:Regular Rate:Normal  11/2021 TTE  1. Left ventricular ejection fraction, by estimation, is 55 to 60%. The  left ventricle has normal function. The left ventricle has no regional  wall motion abnormalities. There is mild left ventricular hypertrophy.  Left ventricular diastolic parameters  are indeterminate.   2. Right ventricular systolic function is normal. The right ventricular  size is normal. There is normal pulmonary artery systolic pressure.   3. The mitral valve is grossly normal. No evidence of mitral valve  regurgitation.   4. The aortic valve is grossly normal. Aortic valve regurgitation is not  visualized.   5. The inferior vena cava is normal in size with greater than 50%  respiratory variability, suggesting right atrial pressure of 3 mmHg.     Neuro/Psych  PSYCHIATRIC DISORDERS Anxiety     CVA (when she was 46)    GI/Hepatic   Endo/Other  diabetes    Renal/GU Lab Results      Component                Value               Date                      CREATININE               1.12 (H)            09/10/2022                BUN                      25 (H)              09/10/2022                NA                       141                 09/10/2022                K                        3.8                 09/10/2022                    Musculoskeletal  (+) Arthritis ,     Abdominal  (+) + obese (BMI 39)  Peds  Hematology On eliquis   Anesthesia Other Findings R breast CA  Reproductive/Obstetrics                             Anesthesia Physical Anesthesia Plan  ASA: 3  Anesthesia Plan: General   Post-op Pain Management: Precedex  and Ofirmev IV (intra-op)*   Induction: Intravenous  PONV Risk Score and Plan: 4 or greater and TIVA, Propofol infusion, Treatment may vary due to age or medical condition and Ondansetron  Airway Management Planned: LMA  Additional Equipment: None  Intra-op Plan:   Post-operative Plan:   Informed Consent: I have reviewed the patients History and Physical, chart, labs and discussed the procedure including the risks, benefits and alternatives for the proposed anesthesia with the patient or authorized representative who has indicated his/her understanding and acceptance.     Dental advisory given  Plan Discussed with:   Anesthesia Plan Comments: (TIVA LMA)        Anesthesia Quick Evaluation

## 2022-09-10 NOTE — Progress Notes (Signed)

## 2022-09-11 ENCOUNTER — Encounter (HOSPITAL_BASED_OUTPATIENT_CLINIC_OR_DEPARTMENT_OTHER)
Admission: RE | Disposition: A | Discharge status: Home / Self Care | Payer: Self-pay | Source: Home / Self Care | Attending: Surgery

## 2022-09-11 ENCOUNTER — Encounter (HOSPITAL_BASED_OUTPATIENT_CLINIC_OR_DEPARTMENT_OTHER): Payer: Self-pay | Admitting: Surgery

## 2022-09-11 ENCOUNTER — Ambulatory Visit (HOSPITAL_BASED_OUTPATIENT_CLINIC_OR_DEPARTMENT_OTHER)
Admission: RE | Admit: 2022-09-11 | Discharge: 2022-09-11 | Disposition: A | Discharge status: Home / Self Care | Payer: Medicare Other | Attending: Surgery | Admitting: Surgery

## 2022-09-11 ENCOUNTER — Ambulatory Visit (HOSPITAL_BASED_OUTPATIENT_CLINIC_OR_DEPARTMENT_OTHER): Payer: Medicare Other | Admitting: Anesthesiology

## 2022-09-11 ENCOUNTER — Other Ambulatory Visit: Payer: Self-pay

## 2022-09-11 DIAGNOSIS — I4891 Unspecified atrial fibrillation: Secondary | ICD-10-CM | POA: Insufficient documentation

## 2022-09-11 DIAGNOSIS — N6310 Unspecified lump in the right breast, unspecified quadrant: Secondary | ICD-10-CM | POA: Diagnosis not present

## 2022-09-11 DIAGNOSIS — I10 Essential (primary) hypertension: Secondary | ICD-10-CM | POA: Diagnosis not present

## 2022-09-11 DIAGNOSIS — J45909 Unspecified asthma, uncomplicated: Secondary | ICD-10-CM

## 2022-09-11 DIAGNOSIS — N189 Chronic kidney disease, unspecified: Secondary | ICD-10-CM | POA: Insufficient documentation

## 2022-09-11 DIAGNOSIS — F419 Anxiety disorder, unspecified: Secondary | ICD-10-CM

## 2022-09-11 DIAGNOSIS — E669 Obesity, unspecified: Secondary | ICD-10-CM | POA: Insufficient documentation

## 2022-09-11 DIAGNOSIS — C50811 Malignant neoplasm of overlapping sites of right female breast: Secondary | ICD-10-CM | POA: Diagnosis not present

## 2022-09-11 DIAGNOSIS — Z7901 Long term (current) use of anticoagulants: Secondary | ICD-10-CM | POA: Insufficient documentation

## 2022-09-11 DIAGNOSIS — I129 Hypertensive chronic kidney disease with stage 1 through stage 4 chronic kidney disease, or unspecified chronic kidney disease: Secondary | ICD-10-CM | POA: Insufficient documentation

## 2022-09-11 DIAGNOSIS — E1122 Type 2 diabetes mellitus with diabetic chronic kidney disease: Secondary | ICD-10-CM | POA: Insufficient documentation

## 2022-09-11 DIAGNOSIS — Z7984 Long term (current) use of oral hypoglycemic drugs: Secondary | ICD-10-CM | POA: Insufficient documentation

## 2022-09-11 DIAGNOSIS — E119 Type 2 diabetes mellitus without complications: Secondary | ICD-10-CM

## 2022-09-11 DIAGNOSIS — Z6838 Body mass index (BMI) 38.0-38.9, adult: Secondary | ICD-10-CM | POA: Insufficient documentation

## 2022-09-11 DIAGNOSIS — Z8673 Personal history of transient ischemic attack (TIA), and cerebral infarction without residual deficits: Secondary | ICD-10-CM | POA: Insufficient documentation

## 2022-09-11 HISTORY — PX: BREAST LUMPECTOMY: SHX2

## 2022-09-11 LAB — GLUCOSE, CAPILLARY
Glucose-Capillary: 100 mg/dL — ABNORMAL HIGH (ref 70–99)
Glucose-Capillary: 118 mg/dL — ABNORMAL HIGH (ref 70–99)

## 2022-09-11 SURGERY — BREAST LUMPECTOMY
Anesthesia: General | Site: Breast | Laterality: Right

## 2022-09-11 MED ORDER — MIDAZOLAM HCL 2 MG/2ML IJ SOLN
INTRAMUSCULAR | Status: AC
  Filled 2022-09-11: qty 2

## 2022-09-11 MED ORDER — ATROPINE SULFATE 0.4 MG/ML IV SOLN
INTRAVENOUS | Status: AC
  Filled 2022-09-11: qty 1

## 2022-09-11 MED ORDER — FENTANYL CITRATE (PF) 100 MCG/2ML IJ SOLN
INTRAMUSCULAR | Status: DC | PRN
  Administered 2022-09-11: 50 ug via INTRAVENOUS

## 2022-09-11 MED ORDER — FENTANYL CITRATE (PF) 100 MCG/2ML IJ SOLN: 25.0000 ug | INTRAMUSCULAR | Status: DC | PRN

## 2022-09-11 MED ORDER — DEXMEDETOMIDINE HCL IN NACL 80 MCG/20ML IV SOLN
INTRAVENOUS | Status: DC | PRN
  Administered 2022-09-11: 12 ug via BUCCAL

## 2022-09-11 MED ORDER — LACTATED RINGERS IV SOLN: INTRAVENOUS | Status: DC

## 2022-09-11 MED ORDER — EPHEDRINE 5 MG/ML INJ
INTRAVENOUS | Status: AC
  Filled 2022-09-11: qty 5

## 2022-09-11 MED ORDER — PHENYLEPHRINE HCL (PRESSORS) 10 MG/ML IV SOLN
INTRAVENOUS | Status: DC | PRN
  Administered 2022-09-11: 40 ug via INTRAVENOUS

## 2022-09-11 MED ORDER — CHLORHEXIDINE GLUCONATE CLOTH 2 % EX PADS: 6.0000 | MEDICATED_PAD | Freq: Once | CUTANEOUS | Status: DC

## 2022-09-11 MED ORDER — SUCCINYLCHOLINE CHLORIDE 200 MG/10ML IV SOSY
PREFILLED_SYRINGE | INTRAVENOUS | Status: AC
  Filled 2022-09-11: qty 10

## 2022-09-11 MED ORDER — LIDOCAINE 2% (20 MG/ML) 5 ML SYRINGE
INTRAMUSCULAR | Status: AC
  Filled 2022-09-11: qty 5

## 2022-09-11 MED ORDER — PROPOFOL 10 MG/ML IV BOLUS
INTRAVENOUS | Status: AC
  Filled 2022-09-11: qty 20

## 2022-09-11 MED ORDER — BUPIVACAINE-EPINEPHRINE 0.25% -1:200000 IJ SOLN
INTRAMUSCULAR | Status: DC | PRN
  Administered 2022-09-11: 20 mL

## 2022-09-11 MED ORDER — CEFAZOLIN SODIUM-DEXTROSE 2-4 GM/100ML-% IV SOLN
INTRAVENOUS | Status: AC
  Filled 2022-09-11: qty 100

## 2022-09-11 MED ORDER — SODIUM CHLORIDE 0.9 % IV SOLN
INTRAVENOUS | Status: DC | PRN
  Administered 2022-09-11: 500 mL

## 2022-09-11 MED ORDER — MIDAZOLAM HCL 5 MG/5ML IJ SOLN
INTRAMUSCULAR | Status: DC | PRN
  Administered 2022-09-11: 1 mg via INTRAVENOUS

## 2022-09-11 MED ORDER — ACETAMINOPHEN 500 MG PO TABS
ORAL_TABLET | ORAL | Status: AC
  Filled 2022-09-11: qty 2

## 2022-09-11 MED ORDER — OXYCODONE HCL 5 MG PO TABS: 5.0000 mg | ORAL_TABLET | Freq: Four times a day (QID) | ORAL | 0 refills | Status: AC | PRN

## 2022-09-11 MED ORDER — DEXAMETHASONE SODIUM PHOSPHATE 10 MG/ML IJ SOLN
INTRAMUSCULAR | Status: AC
  Filled 2022-09-11: qty 1

## 2022-09-11 MED ORDER — DEXMEDETOMIDINE HCL IN NACL 80 MCG/20ML IV SOLN
INTRAVENOUS | Status: AC
  Filled 2022-09-11: qty 20

## 2022-09-11 MED ORDER — PHENYLEPHRINE 80 MCG/ML (10ML) SYRINGE FOR IV PUSH (FOR BLOOD PRESSURE SUPPORT)
PREFILLED_SYRINGE | INTRAVENOUS | Status: AC
  Filled 2022-09-11: qty 10

## 2022-09-11 MED ORDER — ACETAMINOPHEN 10 MG/ML IV SOLN: 1000.0000 mg | Freq: Once | INTRAVENOUS | Status: DC | PRN

## 2022-09-11 MED ORDER — BUPIVACAINE-EPINEPHRINE (PF) 0.25% -1:200000 IJ SOLN
INTRAMUSCULAR | Status: AC
  Filled 2022-09-11: qty 120

## 2022-09-11 MED ORDER — ONDANSETRON HCL 4 MG/2ML IJ SOLN
INTRAMUSCULAR | Status: DC | PRN
  Administered 2022-09-11: 4 mg via INTRAVENOUS

## 2022-09-11 MED ORDER — ONDANSETRON HCL 4 MG/2ML IJ SOLN: 4.0000 mg | Freq: Once | INTRAMUSCULAR | Status: DC | PRN

## 2022-09-11 MED ORDER — LIDOCAINE 2% (20 MG/ML) 5 ML SYRINGE
INTRAMUSCULAR | Status: DC | PRN
  Administered 2022-09-11: 60 mg via INTRAVENOUS

## 2022-09-11 MED ORDER — ACETAMINOPHEN 500 MG PO TABS
1000.0000 mg | ORAL_TABLET | ORAL | Status: AC
  Administered 2022-09-11: 1000 mg via ORAL

## 2022-09-11 MED ORDER — ONDANSETRON HCL 4 MG/2ML IJ SOLN
INTRAMUSCULAR | Status: AC
  Filled 2022-09-11: qty 2

## 2022-09-11 MED ORDER — PROPOFOL 500 MG/50ML IV EMUL
INTRAVENOUS | Status: DC | PRN
  Administered 2022-09-11: 200 ug/kg/min via INTRAVENOUS

## 2022-09-11 MED ORDER — CEFAZOLIN SODIUM-DEXTROSE 2-4 GM/100ML-% IV SOLN
2.0000 g | INTRAVENOUS | Status: AC
  Administered 2022-09-11: 2 g via INTRAVENOUS

## 2022-09-11 MED ORDER — FENTANYL CITRATE (PF) 100 MCG/2ML IJ SOLN
INTRAMUSCULAR | Status: AC
  Filled 2022-09-11: qty 2

## 2022-09-11 MED ORDER — PROPOFOL 10 MG/ML IV BOLUS
INTRAVENOUS | Status: DC | PRN
  Administered 2022-09-11: 130 mg via INTRAVENOUS

## 2022-09-11 MED ORDER — SODIUM CHLORIDE 0.9 % IV SOLN
INTRAVENOUS | Status: AC
  Filled 2022-09-11: qty 10

## 2022-09-11 MED ORDER — DEXAMETHASONE SODIUM PHOSPHATE 4 MG/ML IJ SOLN
INTRAMUSCULAR | Status: DC | PRN
  Administered 2022-09-11: 4 mg via INTRAVENOUS

## 2022-09-11 SURGICAL SUPPLY — 50 items
ADH SKN CLS APL DERMABOND .7 (GAUZE/BANDAGES/DRESSINGS) ×1
APL PRP STRL LF DISP 70% ISPRP (MISCELLANEOUS) ×1
APPLIER CLIP 9.375 MED OPEN (MISCELLANEOUS)
APR CLP MED 9.3 20 MLT OPN (MISCELLANEOUS)
BINDER BREAST LRG (GAUZE/BANDAGES/DRESSINGS) IMPLANT
BINDER BREAST MEDIUM (GAUZE/BANDAGES/DRESSINGS) IMPLANT
BINDER BREAST XLRG (GAUZE/BANDAGES/DRESSINGS) IMPLANT
BINDER BREAST XXLRG (GAUZE/BANDAGES/DRESSINGS) IMPLANT
BLADE SURG 15 STRL LF DISP TIS (BLADE) ×1 IMPLANT
BLADE SURG 15 STRL SS (BLADE) ×1
CANISTER SUCT 1200ML W/VALVE (MISCELLANEOUS) ×1 IMPLANT
CHLORAPREP W/TINT 26 (MISCELLANEOUS) ×1 IMPLANT
CLIP APPLIE 9.375 MED OPEN (MISCELLANEOUS) IMPLANT
COVER BACK TABLE 60X90IN (DRAPES) ×1 IMPLANT
COVER MAYO STAND STRL (DRAPES) ×1 IMPLANT
DERMABOND ADVANCED .7 DNX12 (GAUZE/BANDAGES/DRESSINGS) ×1 IMPLANT
DRAIN CHANNEL 19F RND (DRAIN) IMPLANT
DRAPE LAPAROSCOPIC ABDOMINAL (DRAPES) IMPLANT
DRAPE LAPAROTOMY 100X72 PEDS (DRAPES) ×1 IMPLANT
DRAPE UTILITY XL STRL (DRAPES) ×1 IMPLANT
ELECT COATED BLADE 2.86 ST (ELECTRODE) ×1 IMPLANT
ELECT REM PT RETURN 9FT ADLT (ELECTROSURGICAL) ×1
ELECTRODE REM PT RTRN 9FT ADLT (ELECTROSURGICAL) ×1 IMPLANT
GLOVE BIOGEL PI IND STRL 8 (GLOVE) ×1 IMPLANT
GLOVE ECLIPSE 8.0 STRL XLNG CF (GLOVE) ×1 IMPLANT
GOWN STRL REUS W/ TWL LRG LVL3 (GOWN DISPOSABLE) ×2 IMPLANT
GOWN STRL REUS W/ TWL XL LVL3 (GOWN DISPOSABLE) ×1 IMPLANT
GOWN STRL REUS W/TWL LRG LVL3 (GOWN DISPOSABLE) ×2
GOWN STRL REUS W/TWL XL LVL3 (GOWN DISPOSABLE) ×1
HEMOSTAT ARISTA ABSORB 3G PWDR (HEMOSTASIS) IMPLANT
KIT MARKER MARGIN INK (KITS) IMPLANT
NDL HYPO 25X1 1.5 SAFETY (NEEDLE) ×1 IMPLANT
NEEDLE HYPO 25X1 1.5 SAFETY (NEEDLE) ×1 IMPLANT
NS IRRIG 1000ML POUR BTL (IV SOLUTION) ×1 IMPLANT
PACK BASIN DAY SURGERY FS (CUSTOM PROCEDURE TRAY) ×1 IMPLANT
PENCIL SMOKE EVACUATOR (MISCELLANEOUS) ×1 IMPLANT
SLEEVE SCD COMPRESS KNEE MED (STOCKING) ×1 IMPLANT
SPIKE FLUID TRANSFER (MISCELLANEOUS) ×1 IMPLANT
SPONGE T-LAP 4X18 ~~LOC~~+RFID (SPONGE) ×1 IMPLANT
STAPLER VISISTAT 35W (STAPLE) IMPLANT
SUCT RESERVOIR 100CC (MISCELLANEOUS) IMPLANT
SUT ETHILON 2 0 FS 18 (SUTURE) IMPLANT
SUT MON AB 4-0 PC3 18 (SUTURE) ×1 IMPLANT
SUT SILK 2 0 SH (SUTURE) IMPLANT
SUT VICRYL 3-0 CR8 SH (SUTURE) ×1 IMPLANT
SYR CONTROL 10ML LL (SYRINGE) ×1 IMPLANT
TOWEL GREEN STERILE FF (TOWEL DISPOSABLE) ×2 IMPLANT
TRAY FAXITRON CT DISP (TRAY / TRAY PROCEDURE) IMPLANT
TUBE CONNECTING 20X1/4 (TUBING) ×1 IMPLANT
YANKAUER SUCT BULB TIP NO VENT (SUCTIONS) ×1 IMPLANT

## 2022-09-11 NOTE — Discharge Instructions (Addendum)
[place] Office Phone Number [phone]  BREAST BIOPSY/ PARTIAL MASTECTOMY: POST OP INSTRUCTIONS  Always review your discharge instruction sheet given to you by the facility where your surgery was performed.  IF YOU HAVE DISABILITY OR FAMILY LEAVE FORMS, YOU MUST BRING THEM TO THE OFFICE FOR PROCESSING.  DO NOT GIVE THEM TO YOUR DOCTOR.  A prescription for pain medication may be given to you upon discharge.  Take your pain medication as prescribed, if needed.  If narcotic pain medicine is not needed, then you may take acetaminophen (Tylenol) or ibuprofen (Advil) as needed. Take your usually prescribed medications unless otherwise directed If you need a refill on your pain medication, please contact your pharmacy.  They will contact our office to request authorization.  Prescriptions will not be filled after 5pm or on week-ends. You should eat very light the first 24 hours after surgery, such as soup, crackers, pudding, etc.  Resume your normal diet the day after surgery. Most patients will experience some swelling and bruising in the breast.  Ice packs and a good support bra will help.  Swelling and bruising can take several days to resolve.  It is common to experience some constipation if taking pain medication after surgery.  Increasing fluid intake and taking a stool softener will usually help or prevent this problem from occurring.  A mild laxative (Milk of Magnesia or Miralax) should be taken according to package directions if there are no bowel movements after 48 hours. Unless discharge instructions indicate otherwise, you may remove your bandages 24-48 hours after surgery, and you may shower at that time.  You may have steri-strips (small skin tapes) in place directly over the incision.  These strips should be left on the skin for 7-10 days.  If your surgeon used skin glue on the incision, you may shower in 24 hours.  The glue will flake off over the next 2-3 weeks.  Any  sutures or staples will be removed at the office during your follow-up visit. ACTIVITIES:  You may resume regular daily activities (gradually increasing) beginning the next day.  Wearing a good support bra or sports bra minimizes pain and swelling.  You may have sexual intercourse when it is comfortable. You may drive when you no longer are taking prescription pain medication, you can comfortably wear a seatbelt, and you can safely maneuver your car and apply brakes. RETURN TO WORK:  ______________________________________________________________________________________ Dennis Bast should see your doctor in the office for a follow-up appointment approximately two weeks after your surgery.  Your doctor's nurse will typically make your follow-up appointment when she calls you with your pathology report.  Expect your pathology report 2-3 business days after your surgery.  You may call to check if you do not hear from [place] after three days. OTHER INSTRUCTIONS: _______________________________________________________________________________________________ _____________________________________________________________________________________________________________________________________ _____________________________________________________________________________________________________________________________________ _____________________________________________________________________________________________________________________________________  WHEN TO CALL YOUR DOCTOR: Fever over 101.0 Nausea and/or vomiting. Extreme swelling or bruising. Continued bleeding from incision. Increased pain, redness, or drainage from the incision.  The clinic staff is available to answer your questions during regular business hours.  Please don't hesitate to call and ask to speak to one of the nurses for clinical concerns.  If you have a medical emergency, go to the nearest emergency room or call 911.  A surgeon from [facility] is always on call at the hospital.  For further questions, please visit [email or web address]    No Tylenol before 1:00pm if needed.  Post Anesthesia Home Care Instructions  Activity: Get  plenty of rest for the remainder of the day. A responsible individual must stay with you for 24 hours following the procedure.  For the next 24 hours, DO NOT: -Drive a car -Paediatric nurse -Drink alcoholic beverages -Take any medication unless instructed by your physician -Make any legal decisions or sign important papers.  Meals: Start with liquid foods such as gelatin or soup. Progress to regular foods as tolerated. Avoid greasy, spicy, heavy foods. If nausea and/or vomiting occur, drink only clear liquids until the nausea and/or vomiting subsides. Call your physician if vomiting continues.  Special Instructions/Symptoms: Your throat may feel dry or sore from the anesthesia or the breathing tube placed in your throat during surgery. If this causes discomfort, gargle with warm salt water. The discomfort should disappear within 24 hours.  If you had a scopolamine patch placed behind your ear for the management of post- operative nausea and/or vomiting:  1. The medication in the patch is effective for 72 hours, after which it should be removed.  Wrap patch in a tissue and discard in the trash. Wash hands thoroughly with soap and water. 2. You may remove the patch earlier than 72 hours if you experience unpleasant side effects which may include dry mouth, dizziness or visual disturbances. 3. Avoid touching the patch. Wash your hands with soap and water after contact with the patch.

## 2022-09-11 NOTE — Anesthesia Postprocedure Evaluation (Signed)
Anesthesia Post Note  Patient: Meagan Wallace  Procedure(s) Performed: RIGHT BREAST LUMPECTOMY (Right: Breast)     Patient location during evaluation: PACU Anesthesia Type: General Level of consciousness: awake and alert Pain management: pain level controlled Vital Signs Assessment: post-procedure vital signs reviewed and stable Respiratory status: spontaneous breathing, nonlabored ventilation, respiratory function stable and patient connected to nasal cannula oxygen Cardiovascular status: blood pressure returned to baseline and stable Postop Assessment: no apparent nausea or vomiting Anesthetic complications: no  No notable events documented.  Last Vitals:  Vitals:   09/11/22 1045 09/11/22 1115  BP: 126/79 125/65  Pulse: 79 77  Resp: (!) 21 20  Temp:  36.6 C  SpO2: 94% 94%    Last Pain:  Vitals:   09/11/22 1115  TempSrc:   PainSc: 0-No pain                 Barnet Glasgow

## 2022-09-11 NOTE — Interval H&P Note (Signed)
History and Physical Interval Note:  09/11/2022 8:18 AM  Meagan Wallace  has presented today for surgery, with the diagnosis of RIGHT BREAST MASS.  The various methods of treatment have been discussed with the patient and family. After consideration of risks, benefits and other options for treatment, the patient has consented to  Procedure(s): RIGHT BREAST LUMPECTOMY (Right) as a surgical intervention.  The patient's history has been reviewed, patient examined, no change in status, stable for surgery.  I have reviewed the patient's chart and labs.  Questions were answered to the patient's satisfaction.    The procedure has been discussed with the patient. Alternatives to surgery have been discussed with the patient.  Risks of surgery include bleeding,  Infection,  Seroma formation, death,  and the need for further surgery.   The patient understands and wishes to proceed.  [place]

## 2022-09-11 NOTE — Transfer of Care (Signed)
Immediate Anesthesia Transfer of Care Note  Patient: Meagan Wallace  Procedure(s) Performed: RIGHT BREAST LUMPECTOMY (Right: Breast)  Patient Location: PACU  Anesthesia Type:General  Level of Consciousness: awake, drowsy, and patient cooperative  Airway & Oxygen Therapy: Patient Spontanous Breathing and Patient connected to face mask oxygen  Post-op Assessment: Report given to RN and Post -op Vital signs reviewed and stable  Post vital signs: Reviewed and stable  Last Vitals:  Vitals Value Taken Time  BP    Temp    Pulse 77 09/11/22 0947  Resp    SpO2 95 % 09/11/22 0947  Vitals shown include unvalidated device data.  Last Pain:  Vitals:   09/11/22 0654  TempSrc: Oral  PainSc: 0-No pain      Patients Stated Pain Goal: 4 (98/72/15 8727)  Complications: No notable events documented.

## 2022-09-11 NOTE — Op Note (Signed)
Preoperative diagnosis: Right breast mass  Postoperative diagnosis: Same  Procedure: Right breast lumpectomy  Surgeon: Erroll Luna, MD  Anesthesia: LMA with 0.25% Marcaine plain  EBL: Minimal  Specimen: 6 cm mass right breast to include skin medial upper quadrant  Drains: 19 round  IV fluids: Per anesthesia record  Indications for procedure: The patient is a 76 year old female with past history of right breast DCIS diagnosed in 2015.  She underwent a lumpectomy followed by radiation therapy.  She developed this large fungating lesion from the inner aspect of her right breast.  Core biopsy showed inflammation.  Despite multiple medical management and treatment of this nonoperatively, it would not resolve.  Concern was for an underlying malignancy therefore recommend excision of this to treat her symptoms and diagnose her problem.The procedure has been discussed with the patient. Alternatives to surgery have been discussed with the patient.  Risks of surgery include bleeding,  Infection,  Seroma formation, death,  and the need for further surgery.   The patient understands and wishes to proceed.     Description of procedure: The patient was met in the holding area questions answered.  Right breast was marked as correct side.  She was then taken back to the operating room.  She is placed upon upon the OR table.  After induction of general esthesia, right breast was prepped and draped in sterile fashion timeout performed.  Proper patient, site and procedure verified.  The lesion was located at a previous scar in the medial right breast.  Is quite large open and draining.  There is no signs of infection.  An ellipse was made around the mass.  Dissection was carried down into the deep breast tissue there is a large cavity containing necrotic fat extending to the chest wall.  Maximal diameter this was between 6 and 7 cm in depth.  This entire area was removed.  The cavity was then irrigated.  He  was made hemostatic with cautery.  Antibiotic solution was used for irrigation.  Local anesthetic was infiltrated throughout.  There is separate stab incision a 19 round drain was placed in the deep breast tissue.  Incision then closed with 3-0 Vicryl.  4 Monocryl used for the skin.  2-0 nylon used to secure the drain.  Dermabond applied.  All counts found to be correct.  Breast binder placed.  The patient was awoke extubated taken recovery in satisfactory condition.

## 2022-09-11 NOTE — Anesthesia Procedure Notes (Signed)
Procedure Name: LMA Insertion Date/Time: 09/11/2022 8:38 AM  Performed by: Willa Frater, CRNAPre-anesthesia Checklist: Patient identified, Emergency Drugs available, Suction available and Patient being monitored Patient Re-evaluated:Patient Re-evaluated prior to induction Oxygen Delivery Method: Circle system utilized Preoxygenation: Pre-oxygenation with 100% oxygen Induction Type: IV induction Ventilation: Mask ventilation without difficulty LMA: LMA inserted LMA Size: 4.0 Number of attempts: 1 Airway Equipment and Method: Bite block Placement Confirmation: positive ETCO2 Tube secured with: Tape Dental Injury: Teeth and Oropharynx as per pre-operative assessment

## 2022-09-11 NOTE — H&P (Signed)
History of Present Illness: Meagan Wallace is a 76 y.o. female who is seen today for follow-up of right breast mastitis. The mass is present medial right breast which appears to be inflammatory in nature. Punch biopsies were negative for recurrent malignancy. Its not getting better with drainage nor antibiotics..    Review of Systems: A complete review of systems was obtained from the patient. I have reviewed this information and discussed as appropriate with the patient. See HPI as well for other ROS.    Medical History: Past Medical History:  Diagnosis Date  Arrhythmia  Arthritis  Asthma, unspecified asthma severity, unspecified whether complicated, unspecified whether persistent  Chronic kidney disease  Diabetes mellitus without complication (CMS-HCC)  Glaucoma (increased eye pressure)  History of cancer  History of stroke  Hyperlipidemia  Hypertension  Liver disease   There is no problem list on file for this patient.  Past Surgical History:  Procedure Laterality Date  APPENDECTOMY  CHOLECYSTECTOMY  JOINT REPLACEMENT    No Known Allergies  Current Outpatient Medications on File Prior to Visit  Medication Sig Dispense Refill  amoxicillin-clavulanate (AUGMENTIN) 875-125 mg tablet Take 1 tablet by mouth 2 (two) times daily  apixaban (ELIQUIS) 5 mg tablet Take 5 mg by mouth 2 (two) times daily  dilTIAZem (CARDIZEM CD) 120 MG XR capsule Take 120 mg by mouth once daily  FUROsemide (LASIX) 20 MG tablet Take 1 tablet by mouth once daily  glimepiride (AMARYL) 4 MG tablet Take 4 mg by mouth daily with breakfast  metFORMIN (GLUCOPHAGE) 1000 MG tablet 1 (ONE) TABLET TWICE DAILY FOR DIABETES  pravastatin (PRAVACHOL) 10 MG tablet Take 10 mg by mouth at bedtime  ramipriL (ALTACE) 10 MG capsule Take 10 mg by mouth once daily  venlafaxine (EFFEXOR-XR) 150 MG XR capsule Take 1 capsule by mouth once daily   No current facility-administered medications on file prior to visit.    Family History  Problem Relation Age of Onset  Stroke Mother  Obesity Mother  Coronary Artery Disease (Blocked arteries around heart) Mother  Diabetes Father  Coronary Artery Disease (Blocked arteries around heart) Father  Hyperlipidemia (Elevated cholesterol) Father  High blood pressure (Hypertension) Father  Obesity Father  Stroke Father  Obesity Sister  High blood pressure (Hypertension) Sister  Diabetes Sister  Skin cancer Brother  Stroke Brother  High blood pressure (Hypertension) Brother  Obesity Brother  Diabetes Brother    Social History   Tobacco Use  Smoking Status Never  Smokeless Tobacco Never    Social History   Socioeconomic History  Marital status: Widowed  Tobacco Use  Smoking status: Never  Smokeless tobacco: Never  Substance and Sexual Activity  Alcohol use: Not Currently  Drug use: Not Currently   Objective:   There were no vitals filed for this visit.  There is no height or weight on file to calculate BMI.  Right breast shows a red inflamed 2 cm mass involving the skin skin and subcutaneous tissues. There is some drainage from it. Appears to be chronic mastitis. Left breast normal  Assessment and Plan:   Diagnoses and all orders for this visit:  Mass of right breast, unspecified quadrant    Recommend excision of the right breast mass. Core biopsy was negative for malignancy but she does not improving with medical management and feel excision necessary. I discussed possibly closing the area with the drain. This will be sent for further pathological testing to exclude malignancy since the medical management measures have not been successful in  the core biopsy may be a false negative. Risk of bleeding, infection, cosmetic deformity, breast loss, death, DVT, pulmonary embolus, and recurrence and/or the need for additional surgery and cosmetic deformity.  No follow-ups on file.  Kennieth Francois, MD

## 2022-09-12 ENCOUNTER — Encounter (HOSPITAL_BASED_OUTPATIENT_CLINIC_OR_DEPARTMENT_OTHER): Payer: Self-pay | Admitting: Surgery

## 2022-09-16 LAB — SURGICAL PATHOLOGY

## 2022-09-17 ENCOUNTER — Encounter: Payer: Self-pay | Admitting: Surgery

## 2022-09-30 ENCOUNTER — Other Ambulatory Visit: Payer: Self-pay | Admitting: Gastroenterology

## 2022-09-30 DIAGNOSIS — K7469 Other cirrhosis of liver: Secondary | ICD-10-CM

## 2022-10-17 ENCOUNTER — Other Ambulatory Visit: Payer: Medicare Other

## 2022-10-25 ENCOUNTER — Ambulatory Visit
Admission: RE | Admit: 2022-10-25 | Discharge: 2022-10-25 | Disposition: A | Discharge status: Home / Self Care | Payer: Medicare Other | Source: Ambulatory Visit | Attending: Gastroenterology | Admitting: Gastroenterology

## 2022-10-25 DIAGNOSIS — K7469 Other cirrhosis of liver: Secondary | ICD-10-CM

## 2022-10-30 ENCOUNTER — Other Ambulatory Visit: Payer: Self-pay | Admitting: Gastroenterology

## 2022-10-30 DIAGNOSIS — N1330 Unspecified hydronephrosis: Secondary | ICD-10-CM

## 2022-11-01 ENCOUNTER — Ambulatory Visit
Admission: RE | Admit: 2022-11-01 | Discharge: 2022-11-01 | Disposition: A | Discharge status: Home / Self Care | Payer: Medicare Other | Source: Ambulatory Visit | Attending: Gastroenterology | Admitting: Gastroenterology

## 2022-11-01 DIAGNOSIS — N1330 Unspecified hydronephrosis: Secondary | ICD-10-CM

## 2023-01-26 ENCOUNTER — Other Ambulatory Visit: Payer: Self-pay | Admitting: Nurse Practitioner

## 2023-02-05 IMAGING — CT CT HEAD W/O CM
3 of 4 series · 15 of 47 positions shown, 18 images · non-contrast
Comparison: 08/11/2015.

CLINICAL DATA: Facial trauma, hip forehead

EXAM:
CT HEAD WITHOUT CONTRAST
TECHNIQUE: Contiguous axial images were obtained from the base of the skull
through the vertex without intravenous contrast.

[Series 4: head 2.0 h70h · axial · 0.43mm/px · z∈[-91,+37]mm · 9 of 82 slices shown, 12 images]
[im 9/82  brain]
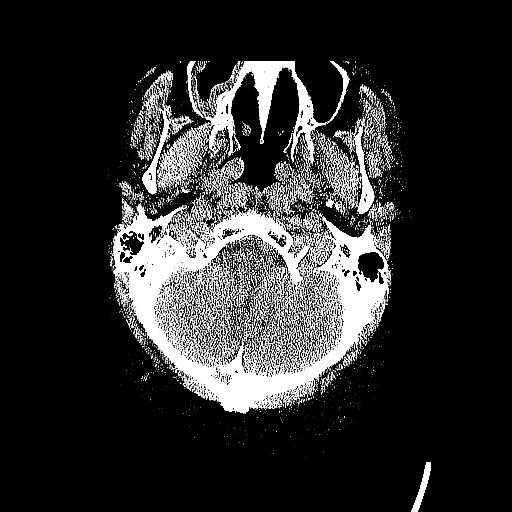
[im 9/82  bone]
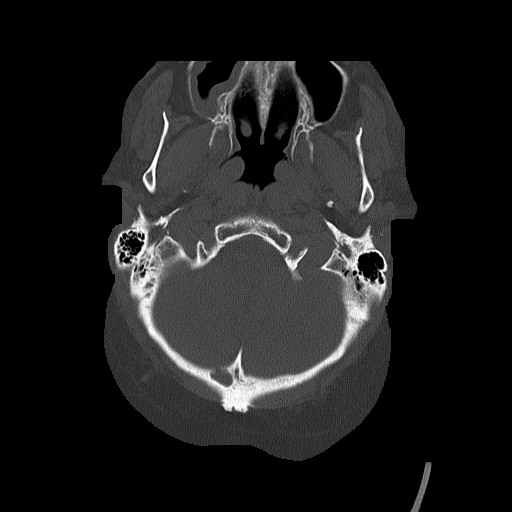
[im 17/82  brain]
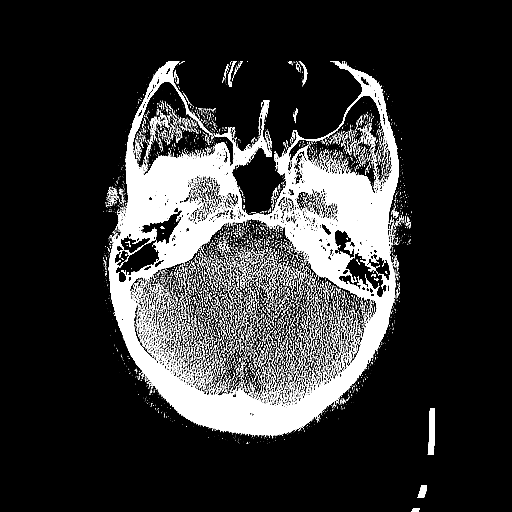
[im 25/82  brain]
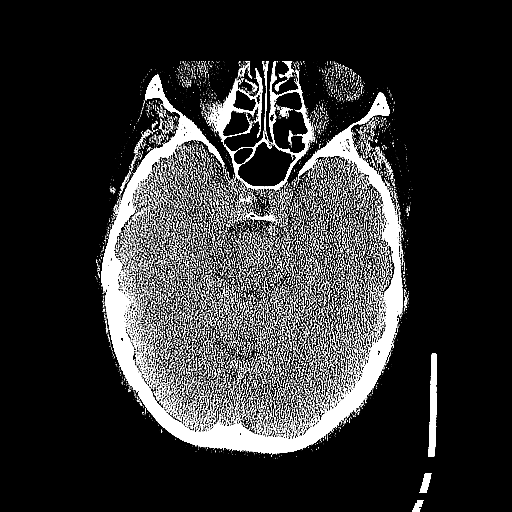
[im 33/82  brain]
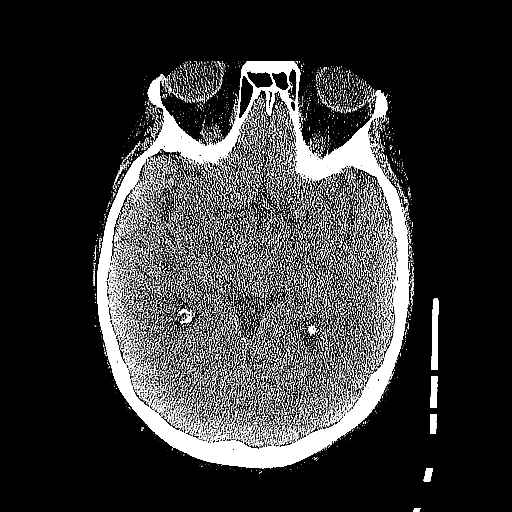
[im 41/82  brain]
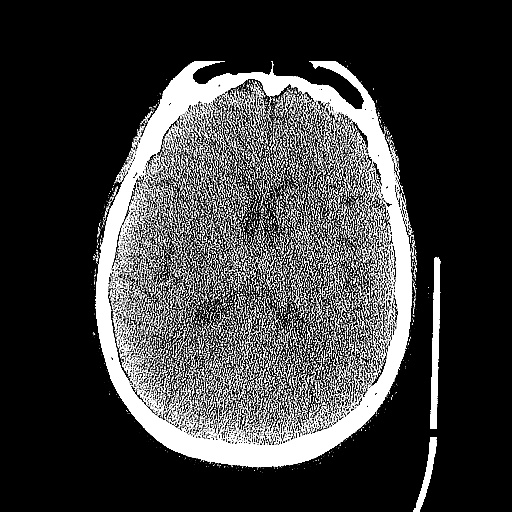
[im 41/82  bone]
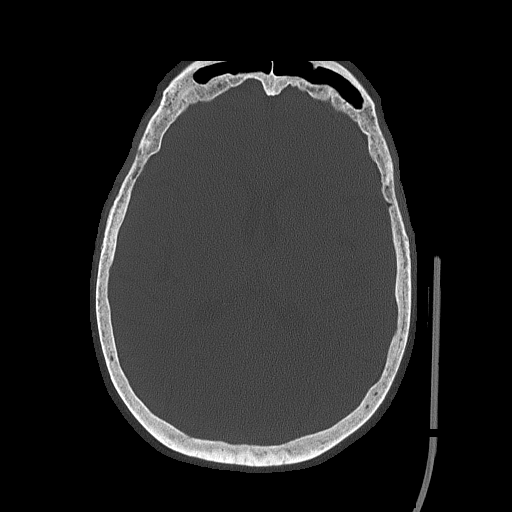
[im 49/82  brain]
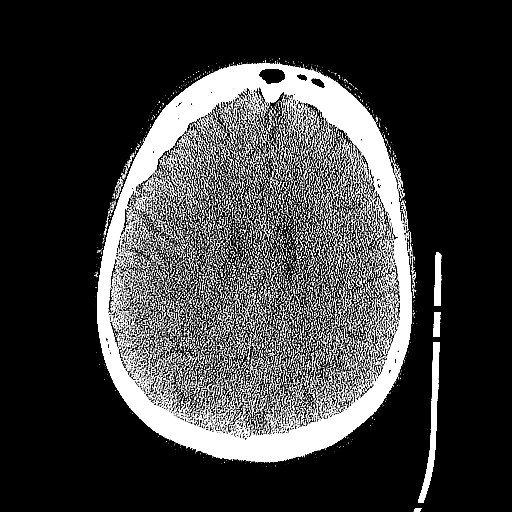
[im 57/82  brain]
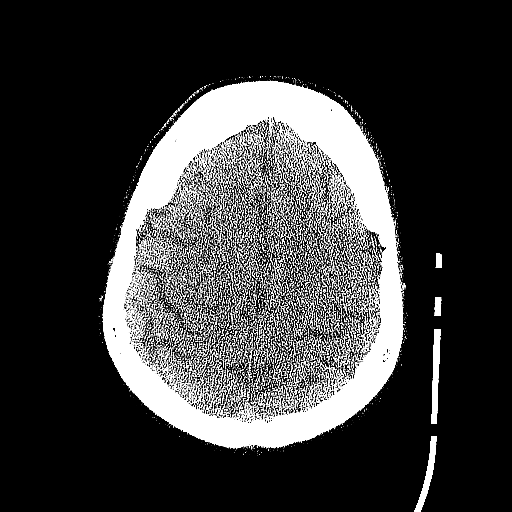
[im 65/82  brain]
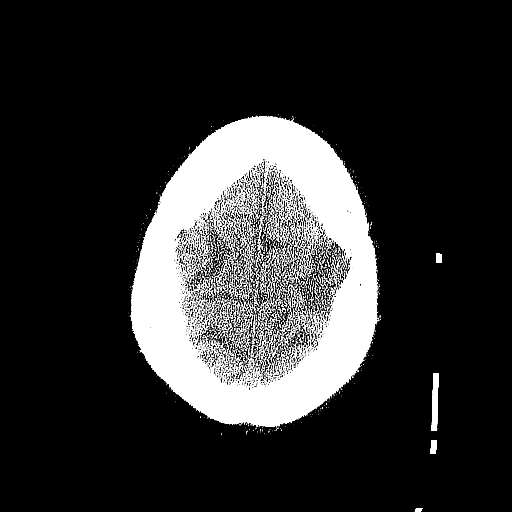
[im 73/82  brain]
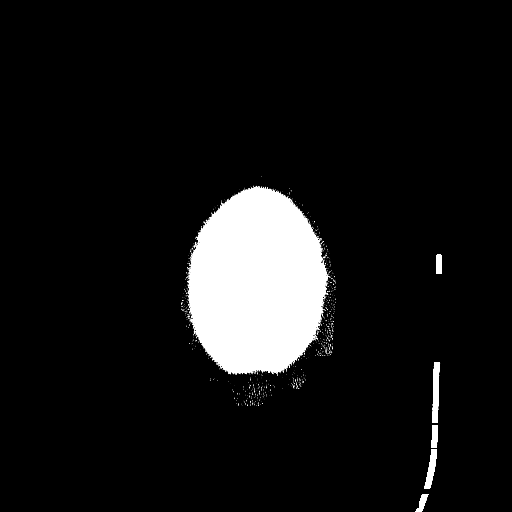
[im 73/82  bone]
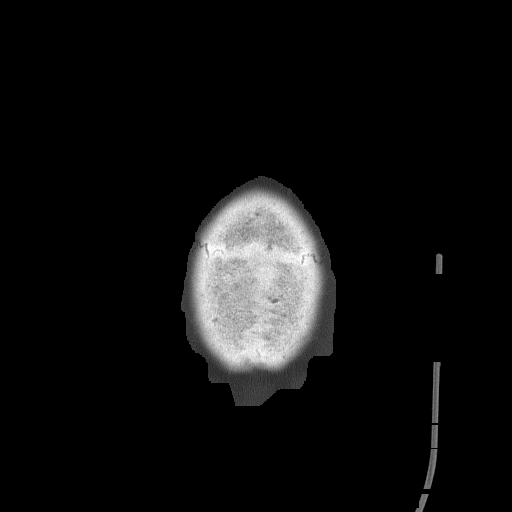

[Series 5: head 3.0 mpr cor · coronal · 0.31mm/px · 3 of 67 slices shown]
[im 23/67  brain]
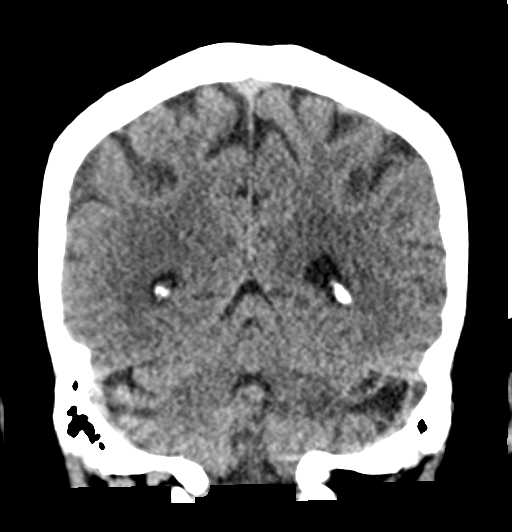
[im 30/67  brain]
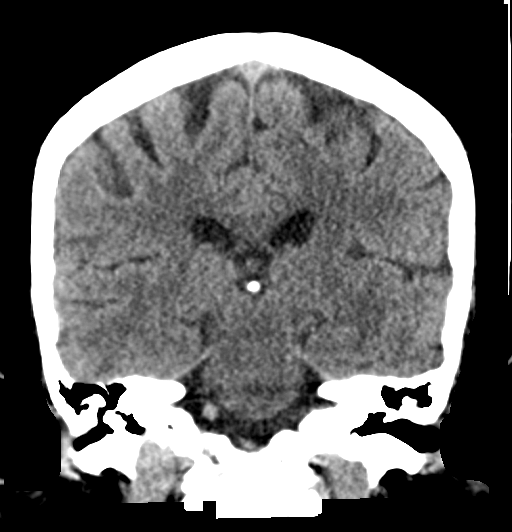
[im 37/67  brain]
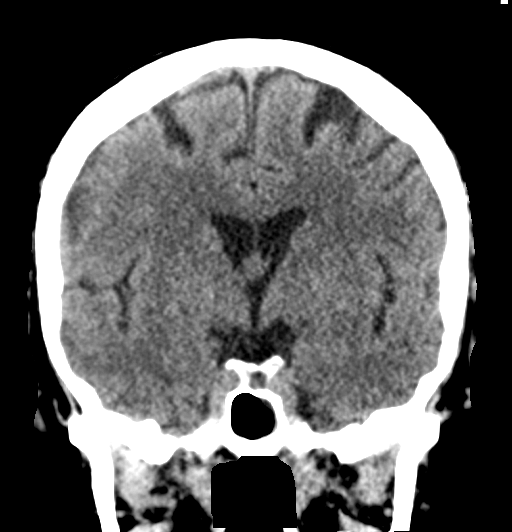

[Series 6: head 3.0 mpr sag · sagittal · 0.32mm/px · 3 of 67 slices shown]
[im 23/67  brain]
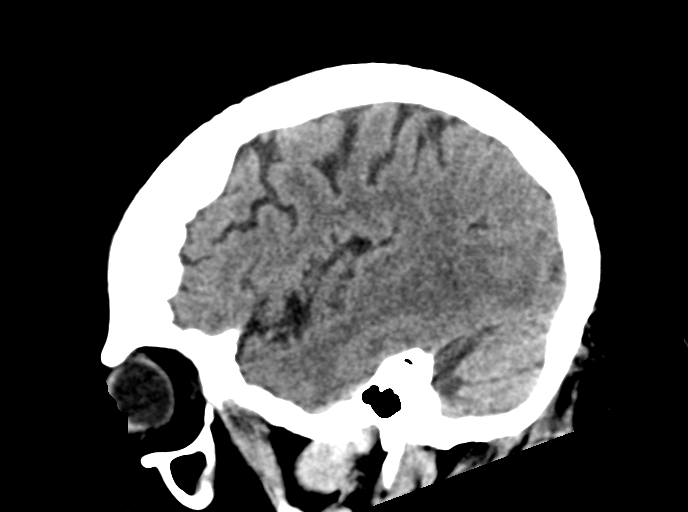
[im 34/67  brain]
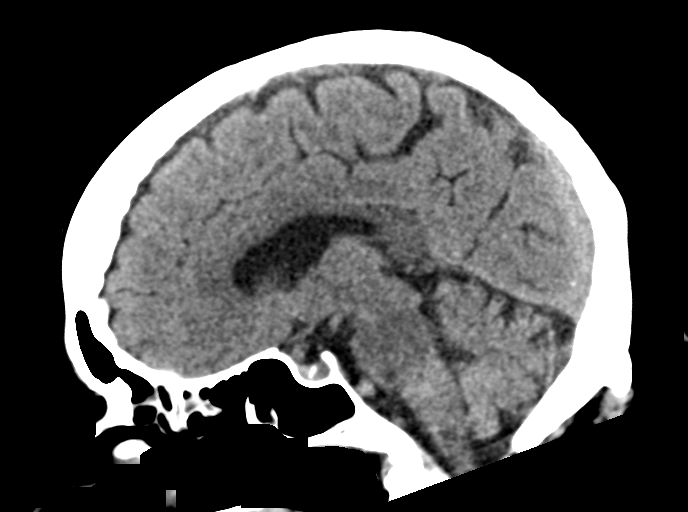
[im 45/67  brain]
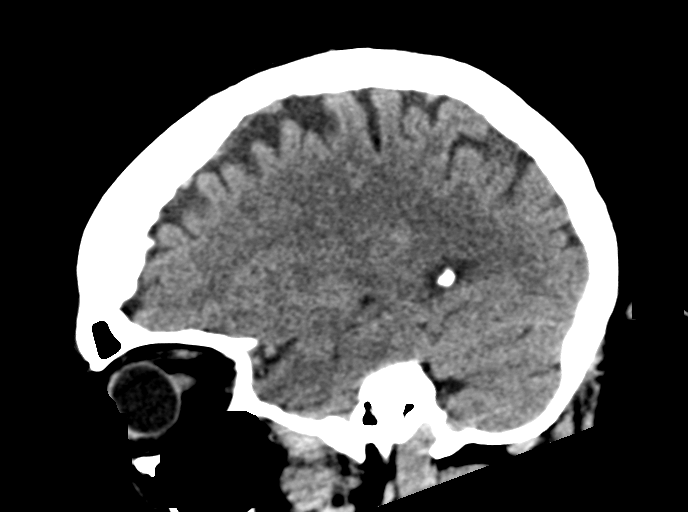

[15 of 47 positions shown; findings below may reference images not displayed]

FINDINGS: Brain: No evidence of acute infarction, hemorrhage, hydrocephalus,
extra-axial collection or mass lesion/mass effect. Periventricular
white matter changes, likely the sequela of chronic small vessel
ischemic disease.

Vascular: No hyperdense vessel or unexpected calcification.

Skull: Normal. Negative for fracture or focal lesion.

Sinuses/Orbits: Mucosal thickening in the right maxillary sinus,
ethmoid air cells, and sphenoid sinuses. Status post bilateral lens
replacements.

Other: The mastoids are well aerated.
IMPRESSION: No acute intracranial process.

## 2023-04-06 ENCOUNTER — Other Ambulatory Visit: Payer: Self-pay | Admitting: Nurse Practitioner

## 2023-05-09 ENCOUNTER — Other Ambulatory Visit: Payer: Self-pay | Admitting: Gastroenterology

## 2023-05-09 DIAGNOSIS — K7469 Other cirrhosis of liver: Secondary | ICD-10-CM

## 2023-05-12 ENCOUNTER — Ambulatory Visit
Admission: RE | Admit: 2023-05-12 | Discharge: 2023-05-12 | Disposition: A | Discharge status: Home / Self Care | Payer: Medicare Other | Source: Ambulatory Visit | Attending: Gastroenterology | Admitting: Gastroenterology

## 2023-05-12 DIAGNOSIS — K7469 Other cirrhosis of liver: Secondary | ICD-10-CM

## 2023-07-16 ENCOUNTER — Telehealth: Payer: Self-pay | Admitting: Cardiology

## 2023-07-16 NOTE — Telephone Encounter (Signed)
1st attempt at scheduling in-office appt for clearance. [facility] and ask to schedule preop clearance.

## 2023-07-16 NOTE — Telephone Encounter (Addendum)
Patient with diagnosis of afib on Eliquis for anticoagulation.    Procedure: colonoscopy Date of procedure: 08/12/23  CHA2DS2-VASc Score = 7  This indicates a 11.2% annual risk of stroke. 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    **This guidance is not considered finalized until pre-operative APP has relayed final recommendations.**

## 2023-07-16 NOTE — Telephone Encounter (Signed)
Pre-operative Risk Assessment    Patient Name: Meagan Wallace  DOB: 1946/01/22 MRN: [id number]  Last visit 05/27/22 Next visit None      Request for Surgical Clearance    Procedure:   colonoscopy  Date of Surgery:  Clearance 08/12/23                                 Surgeon:  Dr. Kathi Der  Surgeon's Group or Practice Name:  [facility]  Phone number:  [phone] Fax number:  [phone]   Type of Clearance Requested:   - Medical  - Pharmacy:  Hold Apixaban (Eliquis)     Type of Anesthesia:   Propofol   Additional requests/questions:   Dr. Eulah Citizen like pt to hold Eliquis for 2 days prior  Signed, Royann Shivers   07/16/2023, 2:19 PM

## 2023-07-16 NOTE — Telephone Encounter (Signed)
Name: RHEDA WYMA  DOB: 12-19-1945  MRN: [id number]  Primary Cardiologist: Olga Millers, MD  Chart reviewed as part of pre-operative protocol coverage. Because of Zaiya Ohora Levay's past medical history and time since last visit, she will require a follow-up in-office visit in order to better assess preoperative cardiovascular risk.  Pre-op covering staff: - Please schedule appointment and call patient to inform them. If patient already had an upcoming appointment within acceptable timeframe, please add "pre-op clearance" to the appointment notes so provider is aware. - Please contact requesting surgeon's office via preferred method (i.e, phone, fax) to inform them of need for appointment prior to surgery.  This message will also be routed to pharmacy pool  for input on holding Eliquis as requested below so that this information is available to the clearing provider at time of patient's appointment.   Patient scheduled for colonoscopy on 08/12/2023  Napoleon Form, Leodis Rains, NP  07/16/2023, 2:53 PM

## 2023-07-17 NOTE — Telephone Encounter (Signed)
Patient has been scheduled for a preop appointment on 07/25/23. Will route to requesting surgeons office to make them aware.

## 2023-07-23 NOTE — Progress Notes (Signed)
Cardiology Clinic Note   Patient Name: Meagan Wallace Date of Encounter: 07/25/2023  Primary Care Provider:  Simone Curia, MD Primary Cardiologist:  Meagan Millers, MD  Patient Profile    77 year old female with a h/o HTN, PAF, hyperlipidemia who is pending colonoscopy on 08/12/2023 by Meagan Wallace, [facility], is here for preoperative evaluation.  She has been reviewed by pharmacy.  It will be okay for her to hold Eliquis for 2 days prior to procedure as long as no new clinical concerns are noted.  Past Medical History    Past Medical History:  Diagnosis Date   Allergy    Anemia    Anxiety    Arthritis    hands, knees   Asthma    Atrial fibrillation (HCC)    Breast cancer (HCC)    right breast   Cataract    bilateral- surgery to remove   Depression    Diabetes mellitus without complication (HCC)    Heart murmur    never has caused any problems per patient   Hyperlipidemia    Hypertension    Personal history of radiation therapy    PONV (postoperative nausea and vomiting)    either   Radiation 02/02/14-03/17/14   Right Breast   Stroke [facility])    at age 57 yrs old, no problems since per patient   Past Surgical History:  Procedure Laterality Date   APPENDECTOMY  1987   BIOPSY  06/12/2021   Procedure: BIOPSY;  Surgeon: Meagan Der, MD;  Location: [facility];  Service: Gastroenterology;;   BREAST BIOPSY Right    BREAST LUMPECTOMY     right breast 2015   BREAST LUMPECTOMY Right 09/11/2022   Procedure: RIGHT BREAST LUMPECTOMY;  Surgeon: Meagan Bouillon, MD;  Location: [facility];  Service: General;  Laterality: Right;   BREAST LUMPECTOMY WITH RADIOACTIVE SEED LOCALIZATION Right 12/27/2013   Procedure: BREAST LUMPECTOMY WITH RADIOACTIVE SEED LOCALIZATION;  Surgeon: Meagan Pu. Cornett, MD;  Location: [facility];  Service: General;  Laterality: Right;   CHOLECYSTECTOMY     COLONOSCOPY  10/27/2017   polyps/Danis   COLONOSCOPY WITH  PROPOFOL N/A 06/12/2021   Procedure: COLONOSCOPY WITH PROPOFOL;  Surgeon: Meagan Der, MD;  Location: [facility];  Service: Gastroenterology;  Laterality: N/A;   ESOPHAGOGASTRODUODENOSCOPY (EGD) WITH PROPOFOL N/A 06/12/2021   Procedure: ESOPHAGOGASTRODUODENOSCOPY (EGD) WITH PROPOFOL;  Surgeon: Meagan Der, MD;  Location: [facility];  Service: Gastroenterology;  Laterality: N/A;   EXPLORATORY LAPAROTOMY  1987   EYE SURGERY Bilateral    cataracts removed   POLYPECTOMY  06/12/2021   Procedure: POLYPECTOMY;  Surgeon: Meagan Der, MD;  Location: [facility];  Service: Gastroenterology;;   SHOULDER SURGERY  2011   right    Allergies  No Known Allergies  History of Present Illness    Meagan Wallace comes today with her niece, sitting in a wheelchair, due to bilateral knee pain, chronic lower extremity edema.  She is not very active.  She denies significant shortness of breath, palpitations, chest discomfort, and continues to have chronic lower extremity edema.  She states she has gained 8 pounds since last month having lost 8 pounds.  She is not adhering to a low-sodium diet.  She continues to take Lasix 40 mg twice a day with good response.  She denies any bleeding on Eliquis.  Home Medications    Current Outpatient Medications  Medication Sig Dispense Refill   acetaminophen (TYLENOL) 500 MG tablet Take 500 mg by mouth every 6 (  six) hours as needed for moderate pain.     albuterol (PROVENTIL) (2.5 MG/3ML) 0.083% nebulizer solution Take 2.5 mg by nebulization every 6 (six) hours as needed for wheezing or shortness of breath.     amLODipine (NORVASC) 5 MG tablet TAKE 1 TABLET (5 MG TOTAL) BY MOUTH DAILY. 90 tablet 1   apixaban (ELIQUIS) 5 MG TABS tablet Take 1 tablet (5 mg total) by mouth 2 (two) times daily. 60 tablet 0   ascorbic acid (VITAMIN C) 500 MG tablet Take 500 mg by mouth daily.     diltiazem (CARDIZEM CD) 120 MG 24 hr capsule Take 1 capsule (120 mg total) by  mouth daily. 30 capsule 0   ferrous sulfate 325 (65 FE) MG tablet Take 325 mg by mouth daily with breakfast.     furosemide (LASIX) 20 MG tablet Take 40 mg by mouth 2 (two) times daily.     glimepiride (AMARYL) 4 MG tablet Take 4 mg by mouth daily with breakfast.     metFORMIN (GLUCOPHAGE) 500 MG tablet Take 2 tablets (1,000 mg total) by mouth 2 (two) times daily with a meal. (Patient taking differently: Take 1,000 mg by mouth daily with breakfast.)     Multiple Vitamins-Minerals (CENTRUM ADULT PO) Take by mouth.     nystatin (MYCOSTATIN/NYSTOP) powder Apply 1 application topically as needed for other. Apply under both breasts for sweating/ moisture     oxyCODONE (OXY IR/ROXICODONE) 5 MG immediate release tablet Take 1 tablet (5 mg total) by mouth every 6 (six) hours as needed for severe pain. 15 tablet 0   pravastatin (PRAVACHOL) 10 MG tablet Take 10 mg by mouth at bedtime.     ramipril (ALTACE) 10 MG capsule Take 10 mg by mouth daily.     venlafaxine XR (EFFEXOR-XR) 150 MG 24 hr capsule Take 150 mg by mouth daily with breakfast.     No current facility-administered medications for this visit.     Family History    Family History  Problem Relation Age of Onset   Heart disease Mother    Stroke Father    Breast cancer Paternal Aunt    Colon cancer Neg Hx    Esophageal cancer Neg Hx    Rectal cancer Neg Hx    Stomach cancer Neg Hx    She indicated that her mother is deceased. She indicated that her father is deceased. She indicated that the status of her paternal aunt is unknown. She indicated that the status of her neg hx is unknown.  Social History    Social History   Socioeconomic History   Marital status: Widowed    Spouse name: Not on file   Number of children: 0   Years of education: Not on file   Highest education level: Not on file  Occupational History   Not on file  Tobacco Use   Smoking status: Never   Smokeless tobacco: Never  Vaping Use   Vaping status: Never  Used  Substance and Sexual Activity   Alcohol use: No   Drug use: No   Sexual activity: Not Currently    Birth control/protection: Post-menopausal  Other Topics Concern   Not on file  Social History Narrative   Husband passed away in 20-Aug-2015   Social Determinants of Health   Financial Resource Strain: Not on file  Food Insecurity: Not on file  Transportation Needs: Not on file  Physical Activity: Not on file  Stress: Not on file  Social Connections: Not on  file  Intimate Partner Violence: Not on file     Review of Systems    General:  No chills, fever, night sweats or weight changes.  Cardiovascular:  No chest pain, dyspnea on exertion, edema, orthopnea, palpitations, paroxysmal nocturnal dyspnea. Dermatological: No rash, lesions/masses Respiratory: No cough, dyspnea Urologic: No hematuria, dysuria Abdominal:   No nausea, vomiting, diarrhea, bright red blood per rectum, melena, or hematemesis Neurologic:  No visual changes, wkns, changes in mental status. All other systems reviewed and are otherwise negative except as noted above.  EKG Interpretation Date/Time:  Friday July 25 2023 09:10:05 EDT Ventricular Rate:  81 PR Interval:  184 QRS Duration:  74 QT Interval:  384 QTC Calculation: 446 R Axis:   -27  Text Interpretation: Normal sinus rhythm Minimal voltage criteria for LVH, may be normal variant ( R in aVL ) Anterolateral infarct (cited on or before 25-Jul-2023) When compared with ECG of 22-Nov-2021 12:26, Sinus rhythm has replaced Atrial fibrillation QRS axis Shifted right Criteria for Inferior infarct are no longer Present Questionable change in initial forces of Septal leads Questionable change in initial forces of Lateral leads Confirmed by Joni Reining [phone]) on 07/25/2023 9:23:53 AM    Physical Exam    VS:  BP (!) 140/76 (BP Location: Left Arm, Patient Position: Sitting, Cuff Size: Large)   Pulse 81   Ht 5\' 4"  (1.626 m)   Wt 253 lb (114.8 kg)   BMI  43.43 kg/m  , BMI Body mass index is 43.43 kg/m.     GEN: Well nourished, well developed, in no acute distress.  Sitting in a wheelchair.  Malodorous. HEENT: normal. Neck: Supple, no JVD, carotid bruits, or masses. Cardiac: RRR, 2/6 systolic murmurs heard at both the right sternal border and left sternal border, rubs, or gallops. No clubbing, cyanosis, positive for edema in feet bilaterally.  Radials/DP/PT 2+ and equal bilaterally.  Respiratory:  Respirations regular and unlabored, inspiratory wheezes are noted especially at the bases. GI: Soft, nontender, nondistended, BS + x 4. MS: no deformity or atrophy.  Wearing support hose bilaterally. Skin: warm and dry, no rash. Neuro:  Strength and sensation are diminished. Psych: Normal affect.  EKG Interpretation Date/Time:  Friday July 25 2023 09:10:05 EDT Ventricular Rate:  81 PR Interval:  184 QRS Duration:  74 QT Interval:  384 QTC Calculation: 446 R Axis:   -27  Text Interpretation: Normal sinus rhythm Minimal voltage criteria for LVH, may be normal variant ( R in aVL ) Anterolateral infarct (cited on or before 25-Jul-2023) When compared with ECG of 22-Nov-2021 12:26, Sinus rhythm has replaced Atrial fibrillation QRS axis Shifted right Criteria for Inferior infarct are no longer Present Questionable change in initial forces of Septal leads Questionable change in initial forces of Lateral leads Confirmed by Joni Reining [phone]) on 07/25/2023 9:23:53 AM   Lab Results  Component Value Date   WBC 14.1 (H) 01/25/2022   HGB 11.5 01/25/2022   HCT 36.0 01/25/2022   MCV 82 01/25/2022   PLT 236 01/25/2022   Lab Results  Component Value Date   CREATININE 1.12 (H) 09/10/2022   BUN 25 (H) 09/10/2022   NA 141 09/10/2022   K 3.8 09/10/2022   CL 103 09/10/2022   CO2 27 09/10/2022   Lab Results  Component Value Date   ALT 14 11/23/2021   AST 13 (L) 11/23/2021   ALKPHOS 110 11/23/2021   BILITOT 0.4 11/23/2021   No results  found for: "CHOL", "HDL", "LDLCALC", "LDLDIRECT", "TRIG", "  CHOLHDL"  Lab Results  Component Value Date   HGBA1C 5.5 11/22/2021     Review of Prior Studies EKG Interpretation Date/Time:  Friday July 25 2023 09:10:05 EDT Ventricular Rate:  81 PR Interval:  184 QRS Duration:  74 QT Interval:  384 QTC Calculation: 446 R Axis:   -27  Text Interpretation: Normal sinus rhythm Minimal voltage criteria for LVH, may be normal variant ( R in aVL ) Anterolateral infarct (cited on or before 25-Jul-2023) When compared with ECG of 22-Nov-2021 12:26, Sinus rhythm has replaced Atrial fibrillation QRS axis Shifted right Criteria for Inferior infarct are no longer Present Questionable change in initial forces of Septal leads Questionable change in initial forces of Lateral leads Confirmed by Joni Reining [phone]) on 07/25/2023 9:23:53 AM    Assessment & Plan   1.  Preoperative cardiac evaluation: According to the Revised Cardiac Risk Index (RCRI), her Perioperative Risk of Major Cardiac Event is (%): 0.9  Her Functional Capacity in METs is: 3.08 according to the Duke Activity Status Index (DASI).  Severely limited due to obesity, bilateral knee pain, chronic LEE, and deconditioning.   Per office protocol, if patient is without any new symptoms or concerns at the time of their virtual visit, he/she may hold Eliquis for 2 days prior to procedure. Please resume Eliquis as soon as possible postprocedure, at the discretion of the surgeon.    2. Chronic LEE:  I have reviewed most recent echocardiogram. Normal LVEF without diastolic dysfunction.  Continue compression hose low sodium diet.   3. PAF: Continues on Eliquis 5 mg BID. Heart rate is well controlled. No changes in regimen for now,.   4. Hypertension: BP is slightly elevated today.  Continue current medication regimen.  Diltiazem amlodipine and ramipril.  Labs are to be followed by PCP.  Most recent labs were completed in August 2024 with a  creatinine of 1.19.  5.  Hypercholesterolemia: Remains on pravastatin 10 mg daily.  Total cholesterol 138, LDL 48 per labs August 2024 by PCP.       Signed, Bettey Mare. Liborio Nixon, ANP, AACC   07/25/2023 10:07 AM      Office [phone] Fax [phone]  Notice: This dictation was prepared with Dragon dictation along with smaller phrase technology. Any transcriptional errors that result from this process are unintentional and may not be corrected upon review.

## 2023-07-25 ENCOUNTER — Encounter: Payer: Self-pay | Admitting: Adult Health

## 2023-07-25 ENCOUNTER — Ambulatory Visit: Payer: Medicare Other | Attending: Adult Health | Admitting: Adult Health

## 2023-07-25 VITALS — BP 140/76 | HR 81 | Ht 64.0 in | Wt 253.0 lb

## 2023-07-25 DIAGNOSIS — I480 Paroxysmal atrial fibrillation: Secondary | ICD-10-CM | POA: Insufficient documentation

## 2023-07-25 DIAGNOSIS — I10 Essential (primary) hypertension: Secondary | ICD-10-CM | POA: Insufficient documentation

## 2023-07-25 DIAGNOSIS — Z01810 Encounter for preprocedural cardiovascular examination: Secondary | ICD-10-CM | POA: Diagnosis present

## 2023-07-25 DIAGNOSIS — E7800 Pure hypercholesterolemia, unspecified: Secondary | ICD-10-CM | POA: Diagnosis present

## 2023-07-25 DIAGNOSIS — R600 Localized edema: Secondary | ICD-10-CM | POA: Diagnosis present

## 2023-07-25 NOTE — Patient Instructions (Signed)
Medication Instructions:  No changes *If you need a refill on your cardiac medications before your next appointment, please call your pharmacy*   Lab Work: No Lab If you have labs (blood work) drawn today and your tests are completely normal, you will receive your results only by: MyChart Message (if you have MyChart) OR A paper copy in the mail If you have any lab test that is abnormal or we need to change your treatment, we will call you to review the results.   Testing/Procedures: No Labs   Follow-Up: At [facility], you and your health needs are our priority.  As part of our continuing mission to provide you with exceptional heart care, we have created designated Provider Care Teams.  These Care Teams include your primary Cardiologist (physician) and Advanced Practice Providers (APPs -  Physician Assistants and Nurse Practitioners) who all work together to provide you with the care you need, when you need it.  We recommend signing up for the patient portal called "MyChart".  Sign up information is provided on this After Visit Summary.  MyChart is used to connect with patients for Virtual Visits (Telemedicine).  Patients are able to view lab/test results, encounter notes, upcoming appointments, etc.  Non-urgent messages can be sent to your provider as well.   To learn more about what you can do with MyChart, go to [email or web address].    Your next appointment:   6 month(s)  Provider:   Olga Millers, MD

## 2023-08-01 NOTE — Telephone Encounter (Signed)
Spoke to patient she stated she has lost 9 lbs since she saw Joni Reining [facility] 07/25/23.She feels good.No swelling.She is taking medications as prescribed.She stated Samara Deist wanted to know if her weight dropped.Advised she is not in office.I will send message to her.

## 2023-08-04 NOTE — Telephone Encounter (Signed)
Called patient at [phone] as requested.  Read instructions from Dr Lyman Bishop note on weight loss and blood pressure criteria and when to call back.  Patient voiced understanding.

## 2023-10-03 ENCOUNTER — Other Ambulatory Visit: Payer: Self-pay | Admitting: Nurse Practitioner

## 2023-12-08 ENCOUNTER — Other Ambulatory Visit: Payer: Self-pay | Admitting: Gastroenterology

## 2023-12-08 DIAGNOSIS — K7469 Other cirrhosis of liver: Secondary | ICD-10-CM

## 2023-12-09 ENCOUNTER — Other Ambulatory Visit: Payer: Self-pay | Admitting: Gastroenterology

## 2023-12-09 DIAGNOSIS — Z1231 Encounter for screening mammogram for malignant neoplasm of breast: Secondary | ICD-10-CM

## 2023-12-19 ENCOUNTER — Ambulatory Visit
Admission: RE | Admit: 2023-12-19 | Discharge: 2023-12-19 | Disposition: A | Discharge status: Home / Self Care | Payer: Medicare Other | Source: Ambulatory Visit | Attending: Gastroenterology | Admitting: Gastroenterology

## 2023-12-19 DIAGNOSIS — K7469 Other cirrhosis of liver: Secondary | ICD-10-CM

## 2024-01-06 ENCOUNTER — Ambulatory Visit
Admission: RE | Admit: 2024-01-06 | Discharge: 2024-01-06 | Disposition: A | Discharge status: Home / Self Care | Payer: Medicare Other | Source: Ambulatory Visit | Attending: Gastroenterology

## 2024-01-06 ENCOUNTER — Encounter: Payer: Self-pay | Admitting: Gastroenterology

## 2024-01-06 DIAGNOSIS — Z1231 Encounter for screening mammogram for malignant neoplasm of breast: Secondary | ICD-10-CM

## 2024-01-13 ENCOUNTER — Other Ambulatory Visit: Payer: Self-pay | Admitting: Internal Medicine

## 2024-01-13 ENCOUNTER — Other Ambulatory Visit: Payer: Self-pay | Admitting: Gastroenterology

## 2024-01-13 DIAGNOSIS — Z853 Personal history of malignant neoplasm of breast: Secondary | ICD-10-CM

## 2024-01-13 DIAGNOSIS — Z9889 Other specified postprocedural states: Secondary | ICD-10-CM

## 2024-01-28 ENCOUNTER — Ambulatory Visit
Admission: RE | Admit: 2024-01-28 | Discharge: 2024-01-28 | Disposition: A | Discharge status: Home / Self Care | Source: Ambulatory Visit | Attending: Internal Medicine | Admitting: Internal Medicine

## 2024-01-28 DIAGNOSIS — Z853 Personal history of malignant neoplasm of breast: Secondary | ICD-10-CM

## 2024-01-28 DIAGNOSIS — Z9889 Other specified postprocedural states: Secondary | ICD-10-CM

## 2024-05-12 ENCOUNTER — Other Ambulatory Visit: Payer: Self-pay

## 2024-05-12 MED ORDER — AMLODIPINE BESYLATE 5 MG PO TABS: 5.0000 mg | ORAL_TABLET | Freq: Every day | ORAL | 0 refills | Status: DC

## 2024-06-01 DIAGNOSIS — R899 Unspecified abnormal finding in specimens from other organs, systems and tissues: Secondary | ICD-10-CM | POA: Diagnosis not present

## 2024-06-02 DIAGNOSIS — R7309 Other abnormal glucose: Secondary | ICD-10-CM | POA: Diagnosis not present

## 2024-06-02 DIAGNOSIS — E1122 Type 2 diabetes mellitus with diabetic chronic kidney disease: Secondary | ICD-10-CM | POA: Diagnosis not present

## 2024-06-02 DIAGNOSIS — I10 Essential (primary) hypertension: Secondary | ICD-10-CM | POA: Diagnosis not present

## 2024-06-02 DIAGNOSIS — J449 Chronic obstructive pulmonary disease, unspecified: Secondary | ICD-10-CM | POA: Diagnosis not present

## 2024-06-02 DIAGNOSIS — R600 Localized edema: Secondary | ICD-10-CM | POA: Diagnosis not present

## 2024-06-02 DIAGNOSIS — K219 Gastro-esophageal reflux disease without esophagitis: Secondary | ICD-10-CM | POA: Diagnosis not present

## 2024-06-02 DIAGNOSIS — I2089 Other forms of angina pectoris: Secondary | ICD-10-CM | POA: Diagnosis not present

## 2024-06-02 DIAGNOSIS — M159 Polyosteoarthritis, unspecified: Secondary | ICD-10-CM | POA: Diagnosis not present

## 2024-06-02 DIAGNOSIS — N1832 Chronic kidney disease, stage 3b: Secondary | ICD-10-CM | POA: Diagnosis not present

## 2024-06-02 DIAGNOSIS — E785 Hyperlipidemia, unspecified: Secondary | ICD-10-CM | POA: Diagnosis not present

## 2024-06-02 DIAGNOSIS — Z853 Personal history of malignant neoplasm of breast: Secondary | ICD-10-CM | POA: Diagnosis not present

## 2024-07-09 DIAGNOSIS — R300 Dysuria: Secondary | ICD-10-CM | POA: Diagnosis not present

## 2024-07-12 DIAGNOSIS — Z9181 History of falling: Secondary | ICD-10-CM | POA: Diagnosis not present

## 2024-07-12 DIAGNOSIS — E785 Hyperlipidemia, unspecified: Secondary | ICD-10-CM | POA: Diagnosis not present

## 2024-07-12 DIAGNOSIS — J449 Chronic obstructive pulmonary disease, unspecified: Secondary | ICD-10-CM | POA: Diagnosis not present

## 2024-07-12 DIAGNOSIS — Z0000 Encounter for general adult medical examination without abnormal findings: Secondary | ICD-10-CM | POA: Diagnosis not present

## 2024-07-12 DIAGNOSIS — R7309 Other abnormal glucose: Secondary | ICD-10-CM | POA: Diagnosis not present

## 2024-07-12 DIAGNOSIS — K219 Gastro-esophageal reflux disease without esophagitis: Secondary | ICD-10-CM | POA: Diagnosis not present

## 2024-07-12 DIAGNOSIS — C50911 Malignant neoplasm of unspecified site of right female breast: Secondary | ICD-10-CM | POA: Diagnosis not present

## 2024-07-12 DIAGNOSIS — E1122 Type 2 diabetes mellitus with diabetic chronic kidney disease: Secondary | ICD-10-CM | POA: Diagnosis not present

## 2024-07-12 DIAGNOSIS — N1832 Chronic kidney disease, stage 3b: Secondary | ICD-10-CM | POA: Diagnosis not present

## 2024-07-12 DIAGNOSIS — I10 Essential (primary) hypertension: Secondary | ICD-10-CM | POA: Diagnosis not present

## 2024-08-09 DIAGNOSIS — I739 Peripheral vascular disease, unspecified: Secondary | ICD-10-CM | POA: Diagnosis not present

## 2024-08-09 DIAGNOSIS — C50911 Malignant neoplasm of unspecified site of right female breast: Secondary | ICD-10-CM | POA: Diagnosis not present

## 2024-08-09 DIAGNOSIS — B359 Dermatophytosis, unspecified: Secondary | ICD-10-CM | POA: Diagnosis not present

## 2024-08-09 DIAGNOSIS — I10 Essential (primary) hypertension: Secondary | ICD-10-CM | POA: Diagnosis not present

## 2024-08-09 DIAGNOSIS — R197 Diarrhea, unspecified: Secondary | ICD-10-CM | POA: Diagnosis not present

## 2024-08-09 DIAGNOSIS — N1832 Chronic kidney disease, stage 3b: Secondary | ICD-10-CM | POA: Diagnosis not present

## 2024-08-09 DIAGNOSIS — K219 Gastro-esophageal reflux disease without esophagitis: Secondary | ICD-10-CM | POA: Diagnosis not present

## 2024-08-09 DIAGNOSIS — E785 Hyperlipidemia, unspecified: Secondary | ICD-10-CM | POA: Diagnosis not present

## 2024-08-09 DIAGNOSIS — J449 Chronic obstructive pulmonary disease, unspecified: Secondary | ICD-10-CM | POA: Diagnosis not present

## 2024-08-09 DIAGNOSIS — R7309 Other abnormal glucose: Secondary | ICD-10-CM | POA: Diagnosis not present

## 2024-08-09 DIAGNOSIS — I2089 Other forms of angina pectoris: Secondary | ICD-10-CM | POA: Diagnosis not present

## 2024-08-09 DIAGNOSIS — E1122 Type 2 diabetes mellitus with diabetic chronic kidney disease: Secondary | ICD-10-CM | POA: Diagnosis not present

## 2024-08-20 ENCOUNTER — Other Ambulatory Visit: Payer: Self-pay | Admitting: *Deleted

## 2024-08-20 DIAGNOSIS — R0989 Other specified symptoms and signs involving the circulatory and respiratory systems: Secondary | ICD-10-CM

## 2024-09-13 ENCOUNTER — Telehealth (HOSPITAL_BASED_OUTPATIENT_CLINIC_OR_DEPARTMENT_OTHER): Payer: Self-pay | Admitting: *Deleted

## 2024-09-13 NOTE — Telephone Encounter (Signed)
 Pt has been scheduled to see Dr. Pietro 09/15/24 11:40. Pt has been made aware of [address] address.   Will update all parties involved.

## 2024-09-13 NOTE — Telephone Encounter (Signed)
   Pre-operative Risk Assessment    Patient Name: Meagan Wallace  DOB: July 26, 1946 MRN: [id number]   Date of last office visit: 07/25/23 LAMARR SATTERFIELD, DNP Date of next office visit: NONE   Request for Surgical Clearance    Procedure:  COLONOSCOPY (HEMATOCHEZIA)  Date of Surgery:  Clearance TBD                                Surgeon:  DR. ELICIA Surgeon's Group or Practice Name:  [facility] Phone number:  [phone] Fax number:  [phone]   Type of Clearance Requested:   - Medical  - Pharmacy:  Hold Apixaban  (Eliquis ) x 2 DAYS PRIOR   Type of Anesthesia:  PROPOFOL    Additional requests/questions:    Bonney Niels Jest   09/13/2024, 10:31 AM

## 2024-09-13 NOTE — Telephone Encounter (Signed)
 Pharmacy please advise on holding Eliquis  prior to colonoscopy,  scheduled for TBD. Last labs (per [facility]) CVC 05/13/2024; CMET 07/12/2024. Thank you.

## 2024-09-13 NOTE — Telephone Encounter (Signed)
   Name: LIANA CAMERER  DOB: 07-28-1946  MRN: [id number]  Primary Cardiologist: Redell Shallow, MD  Chart reviewed as part of pre-operative protocol coverage. Because of Letetia K Bell's past medical history and time since last visit, she will require a follow-up in-office visit in order to better assess preoperative cardiovascular risk.  Pre-op covering staff: - Please schedule appointment and call patient to inform them. If patient already had an upcoming appointment within acceptable timeframe, please add pre-op clearance to the appointment notes so provider is aware. - Please contact requesting surgeon's office via preferred method (i.e, phone, fax) to inform them of need for appointment prior to surgery.  Eliquis  recommendations requested to pharmacy.   Lamarr Satterfield, NP  09/13/2024, 10:48 AM

## 2024-09-13 NOTE — Telephone Encounter (Signed)
 Patient with diagnosis of atrial fibrillation on Eliquis  for anticoagulation.    Procedure:  COLONOSCOPY (HEMATOCHEZIA)   Date of Surgery:  Clearance TBD                                CHA2DS2-VASc Score = 7   This indicates a 11.2% annual risk of stroke. 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   Patient will not need bridging with Lovenox (enoxaparin) around procedure.  **This guidance is not considered finalized until pre-operative APP has relayed final recommendations.**

## 2024-09-14 NOTE — Progress Notes (Unsigned)
 HPI: Follow-up atrial fibrillation.  Patient was admitted February 2023 with encephalopathy, diarrhea and weakness.  She was found to be in atrial fibrillation.  At that time echocardiogram showed ejection fraction 55 to 60%, mild left ventricular hypertrophy.  She was treated with Cardizem  and apixaban  with plans for outpatient cardioversion.  However at time of follow-up she had converted to sinus rhythm.  Since last seen she denies increased dyspnea, chest pain, palpitations or syncope.  She has had worsening pedal edema.  Current Outpatient Medications  Medication Sig Dispense Refill   albuterol (PROVENTIL) (2.5 MG/3ML) 0.083% nebulizer solution Take 2.5 mg by nebulization every 6 (six) hours as needed for wheezing or shortness of breath.     amLODipine  (NORVASC ) 5 MG tablet Take 1 tablet (5 mg total) by mouth daily. 90 tablet 0   apixaban  (ELIQUIS ) 5 MG TABS tablet Take 1 tablet (5 mg total) by mouth 2 (two) times daily. 60 tablet 0   diltiazem  (CARDIZEM  CD) 120 MG 24 hr capsule Take 1 capsule (120 mg total) by mouth daily. 30 capsule 0   ferrous sulfate 325 (65 FE) MG tablet Take 325 mg by mouth daily with breakfast.     furosemide (LASIX) 20 MG tablet Take 40 mg by mouth 2 (two) times daily.     glimepiride (AMARYL) 4 MG tablet Take 4 mg by mouth daily with breakfast.     metFORMIN  (GLUCOPHAGE ) 500 MG tablet Take 2 tablets (1,000 mg total) by mouth 2 (two) times daily with a meal. (Patient taking differently: Take 1,000 mg by mouth daily with breakfast.)     Multiple Vitamins-Minerals (CENTRUM ADULT PO) Take by mouth.     nystatin (MYCOSTATIN/NYSTOP) powder Apply 1 application topically as needed for other. Apply under both breasts for sweating/ moisture     pravastatin (PRAVACHOL) 10 MG tablet Take 10 mg by mouth at bedtime.     ramipril (ALTACE) 10 MG capsule Take 10 mg by mouth daily.     venlafaxine XR (EFFEXOR-XR) 150 MG 24 hr capsule Take 150 mg by mouth daily with breakfast.      ascorbic acid (VITAMIN C) 500 MG tablet Take 500 mg by mouth daily. (Patient not taking: Reported on 09/15/2024)     oxyCODONE  (OXY IR/ROXICODONE ) 5 MG immediate release tablet Take 1 tablet (5 mg total) by mouth every 6 (six) hours as needed for severe pain. (Patient not taking: Reported on 09/15/2024) 15 tablet 0   No current facility-administered medications for this visit.     Past Medical History:  Diagnosis Date   Allergy    Anemia    Anxiety    Arthritis    hands, knees   Asthma    Atrial fibrillation (HCC)    Breast cancer (HCC)    right breast   Cataract    bilateral- surgery to remove   Depression    Diabetes mellitus without complication (HCC)    Heart murmur    never has caused any problems per patient   Hyperlipidemia    Hypertension    Personal history of radiation therapy    PONV (postoperative nausea and vomiting)    either   Radiation 02/02/14-03/17/14   Right Breast   Stroke [facility])    at age 28 yrs old, no problems since per patient    Past Surgical History:  Procedure Laterality Date   APPENDECTOMY  1987   BIOPSY  06/12/2021   Procedure: BIOPSY;  Surgeon: Elicia Claw, MD;  Location:  [facility];  Service: Gastroenterology;;   BREAST BIOPSY Right    BREAST LUMPECTOMY     right breast 09-24-14   BREAST LUMPECTOMY Right 09/11/2022   Procedure: RIGHT BREAST LUMPECTOMY;  Surgeon: Vanderbilt Ned, MD;  Location: [facility];  Service: General;  Laterality: Right;   BREAST LUMPECTOMY WITH RADIOACTIVE SEED LOCALIZATION Right 12/27/2013   Procedure: BREAST LUMPECTOMY WITH RADIOACTIVE SEED LOCALIZATION;  Surgeon: Ned LABOR. Cornett, MD;  Location: [facility];  Service: General;  Laterality: Right;   CHOLECYSTECTOMY     COLONOSCOPY  10/27/2017   polyps/Danis   COLONOSCOPY WITH PROPOFOL  N/A 06/12/2021   Procedure: COLONOSCOPY WITH PROPOFOL ;  Surgeon: Elicia Claw, MD;  Location: [facility];  Service: Gastroenterology;   Laterality: N/A;   ESOPHAGOGASTRODUODENOSCOPY (EGD) WITH PROPOFOL  N/A 06/12/2021   Procedure: ESOPHAGOGASTRODUODENOSCOPY (EGD) WITH PROPOFOL ;  Surgeon: Elicia Claw, MD;  Location: [facility];  Service: Gastroenterology;  Laterality: N/A;   EXPLORATORY LAPAROTOMY  1987   EYE SURGERY Bilateral    cataracts removed   POLYPECTOMY  06/12/2021   Procedure: POLYPECTOMY;  Surgeon: Elicia Claw, MD;  Location: [facility];  Service: Gastroenterology;;   SHOULDER SURGERY  09/24/2010   right    Social History   Socioeconomic History   Marital status: Widowed    Spouse name: Not on file   Number of children: 0   Years of education: Not on file   Highest education level: Not on file  Occupational History   Not on file  Tobacco Use   Smoking status: Never   Smokeless tobacco: Never  Vaping Use   Vaping status: Never Used  Substance and Sexual Activity   Alcohol use: No   Drug use: No   Sexual activity: Not Currently    Birth control/protection: Post-menopausal  Other Topics Concern   Not on file  Social History Narrative   Husband passed away in 2015-09-25   Social Drivers of Health   Financial Resource Strain: Not on file  Food Insecurity: Not on file  Transportation Needs: Not on file  Physical Activity: Not on file  Stress: Not on file  Social Connections: Not on file  Intimate Partner Violence: Not on file    Family History  Problem Relation Age of Onset   Heart disease Mother    Stroke Father    Breast cancer Paternal Aunt    Colon cancer Neg Hx    Esophageal cancer Neg Hx    Rectal cancer Neg Hx    Stomach cancer Neg Hx     ROS: no fevers or chills, productive cough, hemoptysis, dysphasia, odynophagia, melena, hematochezia, dysuria, hematuria, rash, seizure activity, orthopnea, PND, claudication. Remaining systems are negative.  Physical Exam: Well-developed well-nourished in no acute distress.  Skin is warm and dry.  HEENT is normal.  Neck is supple.   Chest is clear to auscultation with normal expansion.  Cardiovascular exam is irregular Abdominal exam nontender or distended. No masses palpated. Extremities show no edema. neuro grossly intact  EKG Interpretation Date/Time:  Wednesday September 15 2024 12:22:55 EST Ventricular Rate:  77 PR Interval:    QRS Duration:  70 QT Interval:  382 QTC Calculation: 432 R Axis:   -18  Text Interpretation: Atrial fibrillation Anteroseptal infarct Confirmed by Pietro Rogue ([id number]) on 09/15/2024 12:29:04 PM    A/P  1 paroxysmal atrial fibrillation-Pt is back in atrial fibrillation today.  Continue Cardizem  for rate control.  Continue apixaban .  She remains asymptomatic.  She may be having paroxysms  of atrial fibrillation.  I think for now rate control and anticoagulation would be appropriate.  Check hemoglobin.  Repeat echocardiogram.   2 lower extremity edema-significant lower extremity edema.  Increase Lasix  to 60 mg twice daily.  Check potassium and renal function in 1 week.   3 hypertension-patient's blood pressure is controlled.  Discontinue amlodipine  and she is on Cardizem .   4 hyperlipidemia-continue statin.  Lipids and liver monitored by primary care.  5 Preop eval prior to colonoscopy-hold apixaban  2 days prior to procedure and resume day after if ok with GI; ok for procedure otherwise.   Redell Shallow, MD

## 2024-09-15 ENCOUNTER — Ambulatory Visit: Payer: PRIVATE HEALTH INSURANCE | Attending: Cardiology | Admitting: Cardiology

## 2024-09-15 ENCOUNTER — Encounter: Payer: Self-pay | Admitting: Cardiology

## 2024-09-15 VITALS — BP 106/60 | HR 77 | Ht 63.0 in | Wt 267.0 lb

## 2024-09-15 DIAGNOSIS — E7800 Pure hypercholesterolemia, unspecified: Secondary | ICD-10-CM

## 2024-09-15 DIAGNOSIS — I10 Essential (primary) hypertension: Secondary | ICD-10-CM

## 2024-09-15 DIAGNOSIS — I480 Paroxysmal atrial fibrillation: Secondary | ICD-10-CM | POA: Diagnosis not present

## 2024-09-15 DIAGNOSIS — Z01810 Encounter for preprocedural cardiovascular examination: Secondary | ICD-10-CM | POA: Diagnosis not present

## 2024-09-15 MED ORDER — FUROSEMIDE 20 MG PO TABS: 60.0000 mg | ORAL_TABLET | Freq: Two times a day (BID) | ORAL | 3 refills | Status: AC

## 2024-09-15 NOTE — Patient Instructions (Addendum)
 Medication Instructions:   INCREASE FUROSEMIDE TO 60 MG TWICE DAILY= 3 OF THE 20 MG TABLETS TWICE DAILY  STOP AMLODIPINE   *If you need a refill on your cardiac medications before your next appointment, please call your pharmacy*  Lab Work:  Your physician recommends that you return for lab work in: ONE WEEK-DO NOT NEED TO FAST  If you have labs (blood work) drawn today and your tests are completely normal, you will receive your results only by: MyChart Message (if you have MyChart) OR A paper copy in the mail If you have any lab test that is abnormal or we need to change your treatment, we will call you to review the results.  Testing/Procedures:  Your physician has requested that you have an echocardiogram. Echocardiography is a painless test that uses sound waves to create images of your heart. It provides your doctor with information about the size and shape of your heart and how well your heart's chambers and valves are working. This procedure takes approximately one hour. There are no restrictions for this procedure. Please do NOT wear cologne, perfume, aftershave, or lotions (deodorant is allowed). Please arrive 15 minutes prior to your appointment time.  Please note: We ask at that you not bring children with you during ultrasound (echo/ vascular) testing. Due to room size and safety concerns, children are not allowed in the ultrasound rooms during exams. Our front office staff cannot provide observation of children in our lobby area while testing is being conducted. An adult accompanying a patient to their appointment will only be allowed in the ultrasound room at the discretion of the ultrasound technician under special circumstances. We apologize for any inconvenience. [address]  Follow-Up: At [facility], you and your health needs are our priority.  As part of our continuing mission to provide you with exceptional heart care, our providers are all part of one  team.  This team includes your primary Cardiologist (physician) and Advanced Practice Providers or APPs (Physician Assistants and Nurse Practitioners) who all work together to provide you with the care you need, when you need it.  Your next appointment:   6 MONTHS WITH ANY APP  Your physician wants you to follow-up in: 1 YEAR WITH DR PIETRO You will receive a reminder letter in the mail two months in advance. If you don't receive a letter, please call our office to schedule the follow-up appointment.   Other Instructions  MAY HOLD ELIQUIS  FOR 2 DAYS PRIOR TO COLONOSCOPY

## 2024-09-15 NOTE — Progress Notes (Unsigned)
 Patient ID: Meagan Wallace, female   DOB: [birth year], 78 y.o.   MRN: [id number]  Reason for Consult: New Patient (Initial Visit)   Referred by Jama Chow, MD  Subjective:     HPI Meagan Wallace is a 78 y.o. female presenting for evaluation of leg swelling and weeping.  She is wheel chair bound and does not walk.  She also has significant shortness of breath when laying flat and therefore sleeps in a chair.  She has had some weeping wounds develop on her legs.  She has not worn compression because they do not fit on her legs.  She reports she has had leg swelling for many years.  She denies rest pain.  She denies varicosities.  She denies any previous blood clots.  Past Medical History:  Diagnosis Date   Allergy    Anemia    Anxiety    Arthritis    hands, knees   Asthma    Atrial fibrillation (HCC)    Breast cancer (HCC)    right breast   Cataract    bilateral- surgery to remove   Depression    Diabetes mellitus without complication (HCC)    Heart murmur    never has caused any problems per patient   Hyperlipidemia    Hypertension    Peripheral vascular disease    Personal history of radiation therapy    PONV (postoperative nausea and vomiting)    either   Radiation 02/02/14-03/17/14   Right Breast   Stroke (HCC)    at age 47 yrs old, no problems since per patient   Family History  Problem Relation Age of Onset   Heart disease Mother    Stroke Father    Breast cancer Paternal Aunt    Colon cancer Neg Hx    Esophageal cancer Neg Hx    Rectal cancer Neg Hx    Stomach cancer Neg Hx    Past Surgical History:  Procedure Laterality Date   APPENDECTOMY  1987   BIOPSY  06/12/2021   Procedure: BIOPSY;  Surgeon: Elicia Claw, MD;  [facility]: [facility];  Service: Gastroenterology;;   BREAST BIOPSY Right    BREAST LUMPECTOMY     right breast 2015   BREAST LUMPECTOMY Right 09/11/2022   Procedure: RIGHT BREAST LUMPECTOMY;  Surgeon: Vanderbilt Ned, MD;   Location: [facility];  Service: General;  Laterality: Right;   BREAST LUMPECTOMY WITH RADIOACTIVE SEED LOCALIZATION Right 12/27/2013   Procedure: BREAST LUMPECTOMY WITH RADIOACTIVE SEED LOCALIZATION;  Surgeon: Ned LABOR. Cornett, MD;  Location: [facility];  Service: General;  Laterality: Right;   CHOLECYSTECTOMY     COLONOSCOPY  10/27/2017   polyps/Danis   COLONOSCOPY WITH PROPOFOL  N/A 06/12/2021   Procedure: COLONOSCOPY WITH PROPOFOL ;  Surgeon: Elicia Claw, MD;  [facility];  Service: Gastroenterology;  Laterality: N/A;   ESOPHAGOGASTRODUODENOSCOPY (EGD) WITH PROPOFOL  N/A 06/12/2021   Procedure: ESOPHAGOGASTRODUODENOSCOPY (EGD) WITH PROPOFOL ;  Surgeon: Elicia Claw, MD;  [facility]: [facility];  Service: Gastroenterology;  Laterality: N/A;   EXPLORATORY LAPAROTOMY  1987   EYE SURGERY Bilateral    cataracts removed   POLYPECTOMY  06/12/2021   Procedure: POLYPECTOMY;  Surgeon: Elicia Claw, MD;  Location: [facility];  Service: Gastroenterology;;   SHOULDER SURGERY  2011   right    Short Social History:  Social History   Tobacco Use   Smoking status: Never   Smokeless tobacco: Never  Substance Use Topics   Alcohol use: No  No Known Allergies  Current Outpatient Medications  Medication Sig Dispense Refill   albuterol (PROVENTIL) (2.5 MG/3ML) 0.083% nebulizer solution Take 2.5 mg by nebulization every 6 (six) hours as needed for wheezing or shortness of breath.     apixaban  (ELIQUIS ) 5 MG TABS tablet Take 1 tablet (5 mg total) by mouth 2 (two) times daily. 60 tablet 0   diltiazem  (CARDIZEM  CD) 120 MG 24 hr capsule Take 1 capsule (120 mg total) by mouth daily. 30 capsule 0   ferrous sulfate 325 (65 FE) MG tablet Take 325 mg by mouth daily with breakfast.     furosemide  (LASIX ) 20 MG tablet Take 3 tablets (60 mg total) by mouth 2 (two) times daily. 540 tablet 3   glimepiride (AMARYL) 4 MG tablet Take 4 mg by mouth daily with  breakfast.     metFORMIN  (GLUCOPHAGE ) 500 MG tablet Take 2 tablets (1,000 mg total) by mouth 2 (two) times daily with a meal. (Patient taking differently: Take 1,000 mg by mouth daily with breakfast.)     Multiple Vitamins-Minerals (CENTRUM ADULT PO) Take by mouth.     nystatin (MYCOSTATIN/NYSTOP) powder Apply 1 application topically as needed for other. Apply under both breasts for sweating/ moisture     pravastatin (PRAVACHOL) 10 MG tablet Take 10 mg by mouth at bedtime.     ramipril (ALTACE) 10 MG capsule Take 10 mg by mouth daily.     venlafaxine XR (EFFEXOR-XR) 150 MG 24 hr capsule Take 150 mg by mouth daily with breakfast.     oxyCODONE  (OXY IR/ROXICODONE ) 5 MG immediate release tablet Take 1 tablet (5 mg total) by mouth every 6 (six) hours as needed for severe pain. (Patient not taking: Reported on 09/15/2024) 15 tablet 0   No current facility-administered medications for this visit.    REVIEW OF SYSTEMS  All other systems were reviewed and are negative     Objective:  Objective  Vitals:   09/17/24 1052  BP: (!) 140/80  Pulse: 94  Resp: (!) 24  Temp: 97.8 F (36.6 C)  SpO2: 98%    Physical Exam General: no acute distress Cardiac: hemodynamically stable Extremities: Significant pedal and lower leg edema.  Woodiness to the skin on the anterior shin.  No open wounds.  Data: ABI +---------+------------------+-----+-----------+--------+  Right   Rt Pressure (mmHg)IndexWaveform   Comment   +---------+------------------+-----+-----------+--------+  Brachial 139                                         +---------+------------------+-----+-----------+--------+  PTA     163               1.10 multiphasic          +---------+------------------+-----+-----------+--------+  DP      180               1.22 multiphasic          +---------+------------------+-----+-----------+--------+  Great Toe188               1.27 Normal                +---------+------------------+-----+-----------+--------+   +---------+------------------+-----+-----------+-------+  Left    Lt Pressure (mmHg)IndexWaveform   Comment  +---------+------------------+-----+-----------+-------+  Brachial 148                                        +---------+------------------+-----+-----------+-------+  PTA     182               1.23 multiphasic         +---------+------------------+-----+-----------+-------+  DP      177               1.20 multiphasic         +---------+------------------+-----+-----------+-------+  Great Toe134               0.91 Normal              +---------+------------------+-----+-----------+-------+   BMP reviewed, Cr 1.1     Assessment/Plan:   Meagan Wallace is a 78 y.o. female with lymphedema and significant foot and lower extremity swelling.  I explained that she has normal arterial flow to her feet and her main issue is her leg swelling.  She has many risk factors for leg swelling including heart disease, liver disease and renal dysfunction.  She also is wheelchair-bound and sleeps in a chair as well.  She has significant pedal edema and therefore I believe she has lymphedema.  I explained that there is no quick fix or surgical/procedural option for the leg swelling.  She needs compression, elevation and exercise which is the foundation of treatment for lower extremity swelling. I recommended a referral to lymphedema clinic which can be sent by her primary care physician.  She should also discuss increasing her Lasix  dose. I strongly encouraged compression and elevation and sleeping in a bed. No role for vascular intervention at this time.  Follow-up as needed   Norman GORMAN Serve MD [facility]

## 2024-09-17 ENCOUNTER — Ambulatory Visit: Payer: PRIVATE HEALTH INSURANCE | Admitting: Vascular Surgery

## 2024-09-17 ENCOUNTER — Ambulatory Visit (HOSPITAL_COMMUNITY)
Admission: RE | Admit: 2024-09-17 | Discharge: 2024-09-17 | Disposition: A | Payer: PRIVATE HEALTH INSURANCE | Source: Ambulatory Visit | Attending: Vascular Surgery

## 2024-09-17 ENCOUNTER — Ambulatory Visit (HOSPITAL_COMMUNITY): Admission: RE | Admit: 2024-09-17

## 2024-09-17 ENCOUNTER — Encounter: Payer: Self-pay | Admitting: Vascular Surgery

## 2024-09-17 ENCOUNTER — Encounter (HOSPITAL_COMMUNITY): Payer: Self-pay | Admitting: Cardiology

## 2024-09-17 VITALS — BP 140/80 | HR 94 | Temp 97.8°F | Resp 24 | Ht 63.0 in | Wt 267.0 lb

## 2024-09-17 DIAGNOSIS — R0989 Other specified symptoms and signs involving the circulatory and respiratory systems: Secondary | ICD-10-CM | POA: Diagnosis not present

## 2024-09-17 DIAGNOSIS — I890 Lymphedema, not elsewhere classified: Secondary | ICD-10-CM | POA: Diagnosis not present

## 2024-09-17 LAB — VAS US ABI WITH/WO TBI
Left ABI: 1.23
Right ABI: 1.22

## 2024-10-18 ENCOUNTER — Other Ambulatory Visit: Payer: Self-pay | Admitting: Gastroenterology

## 2024-12-07 ENCOUNTER — Encounter (HOSPITAL_COMMUNITY): Payer: Self-pay

## 2024-12-07 ENCOUNTER — Ambulatory Visit (HOSPITAL_COMMUNITY): Admit: 2024-12-07 | Payer: PRIVATE HEALTH INSURANCE | Admitting: Gastroenterology

## 2024-12-07 SURGERY — COLONOSCOPY
Anesthesia: Monitor Anesthesia Care
